# Patient Record
Sex: Female | Born: 1935
Health system: Southern US, Community
[De-identification: ages and names within clinical notes are randomized; demographics above are authoritative.]

## PROBLEM LIST (undated history)

## (undated) DIAGNOSIS — L570 Actinic keratosis: Secondary | ICD-10-CM

## (undated) DIAGNOSIS — E039 Hypothyroidism, unspecified: Secondary | ICD-10-CM

## (undated) DIAGNOSIS — G4700 Insomnia, unspecified: Secondary | ICD-10-CM

## (undated) DIAGNOSIS — E785 Hyperlipidemia, unspecified: Secondary | ICD-10-CM

## (undated) DIAGNOSIS — H3530 Unspecified macular degeneration: Secondary | ICD-10-CM

## (undated) DIAGNOSIS — I10 Essential (primary) hypertension: Secondary | ICD-10-CM

## (undated) HISTORY — DX: Essential (primary) hypertension: I10

## (undated) HISTORY — DX: Unspecified macular degeneration: H35.30

## (undated) HISTORY — DX: Hyperlipidemia, unspecified: E78.5

## (undated) HISTORY — DX: Hypothyroidism, unspecified: E03.9

## (undated) HISTORY — DX: Actinic keratosis: L57.0

## (undated) HISTORY — DX: Insomnia, unspecified: G47.00

---

## 2003-01-19 ENCOUNTER — Ambulatory Visit (HOSPITAL_COMMUNITY): Admission: RE | Admit: 2003-01-19 | Discharge: 2003-01-19 | Payer: Self-pay | Admitting: Surgery

## 2004-11-23 ENCOUNTER — Ambulatory Visit: Payer: Self-pay | Admitting: Family Medicine

## 2005-11-23 ENCOUNTER — Encounter (INDEPENDENT_AMBULATORY_CARE_PROVIDER_SITE_OTHER): Payer: Self-pay | Admitting: *Deleted

## 2005-11-23 LAB — CONVERTED CEMR LAB: Pap Smear: NORMAL

## 2006-02-19 ENCOUNTER — Ambulatory Visit: Payer: Self-pay | Admitting: Family Medicine

## 2006-05-28 ENCOUNTER — Ambulatory Visit: Payer: Self-pay | Admitting: Family Medicine

## 2007-01-22 ENCOUNTER — Encounter: Payer: Self-pay | Admitting: Family Medicine

## 2007-01-22 ENCOUNTER — Ambulatory Visit: Payer: Self-pay | Admitting: Family Medicine

## 2007-01-22 LAB — CONVERTED CEMR LAB
BUN: 15 mg/dL (ref 6–23)
CO2: 26 meq/L (ref 19–32)
Chloride: 102 meq/L (ref 96–112)
Creatinine, Ser: 0.94 mg/dL (ref 0.40–1.20)
Potassium: 4.9 meq/L (ref 3.5–5.3)
Sodium: 141 meq/L (ref 135–145)

## 2007-02-20 DIAGNOSIS — E039 Hypothyroidism, unspecified: Secondary | ICD-10-CM | POA: Insufficient documentation

## 2007-02-20 DIAGNOSIS — G4700 Insomnia, unspecified: Secondary | ICD-10-CM

## 2007-02-21 ENCOUNTER — Encounter (INDEPENDENT_AMBULATORY_CARE_PROVIDER_SITE_OTHER): Payer: Self-pay | Admitting: *Deleted

## 2007-03-20 ENCOUNTER — Ambulatory Visit: Payer: Self-pay | Admitting: Sports Medicine

## 2007-03-20 ENCOUNTER — Encounter: Payer: Self-pay | Admitting: Family Medicine

## 2007-03-21 ENCOUNTER — Encounter (INDEPENDENT_AMBULATORY_CARE_PROVIDER_SITE_OTHER): Payer: Self-pay | Admitting: *Deleted

## 2007-11-12 ENCOUNTER — Encounter (INDEPENDENT_AMBULATORY_CARE_PROVIDER_SITE_OTHER): Payer: Self-pay | Admitting: Family Medicine

## 2007-11-12 ENCOUNTER — Ambulatory Visit: Payer: Self-pay | Admitting: Family Medicine

## 2007-11-12 LAB — CONVERTED CEMR LAB: TSH: 3.498 microintl units/mL (ref 0.350–5.50)

## 2007-12-11 ENCOUNTER — Encounter: Admission: RE | Admit: 2007-12-11 | Discharge: 2007-12-11 | Payer: Self-pay | Admitting: Sports Medicine

## 2007-12-15 ENCOUNTER — Encounter (INDEPENDENT_AMBULATORY_CARE_PROVIDER_SITE_OTHER): Payer: Self-pay | Admitting: Family Medicine

## 2008-09-17 ENCOUNTER — Ambulatory Visit: Payer: Self-pay | Admitting: Family Medicine

## 2009-01-04 ENCOUNTER — Ambulatory Visit: Payer: Self-pay | Admitting: Family Medicine

## 2009-01-04 ENCOUNTER — Encounter (INDEPENDENT_AMBULATORY_CARE_PROVIDER_SITE_OTHER): Payer: Self-pay | Admitting: Family Medicine

## 2009-01-04 DIAGNOSIS — I10 Essential (primary) hypertension: Secondary | ICD-10-CM | POA: Insufficient documentation

## 2009-01-04 LAB — CONVERTED CEMR LAB
AST: 11 units/L (ref 0–37)
Albumin: 4.1 g/dL (ref 3.5–5.2)
CO2: 25 meq/L (ref 19–32)
Creatinine, Ser: 0.77 mg/dL (ref 0.40–1.20)
HDL: 33 mg/dL — ABNORMAL LOW (ref >39)
Potassium: 4.9 meq/L (ref 3.5–5.3)
TSH: 2.262 microintl units/mL (ref 0.350–4.50)
Total Bilirubin: 0.5 mg/dL (ref 0.3–1.2)
Total CHOL/HDL Ratio: 6.5
Total Protein: 6.9 g/dL (ref 6.0–8.3)
Triglycerides: 263 mg/dL — ABNORMAL HIGH (ref <150)
VLDL: 53 mg/dL — ABNORMAL HIGH (ref 0–40)

## 2009-01-05 ENCOUNTER — Encounter (INDEPENDENT_AMBULATORY_CARE_PROVIDER_SITE_OTHER): Payer: Self-pay | Admitting: Family Medicine

## 2009-01-06 ENCOUNTER — Encounter: Admission: RE | Admit: 2009-01-06 | Discharge: 2009-01-06 | Payer: Self-pay | Admitting: Family Medicine

## 2009-10-14 ENCOUNTER — Ambulatory Visit: Payer: Self-pay | Admitting: Family Medicine

## 2010-02-09 ENCOUNTER — Encounter: Admission: RE | Admit: 2010-02-09 | Discharge: 2010-02-09 | Payer: Self-pay | Admitting: Family Medicine

## 2010-03-08 ENCOUNTER — Ambulatory Visit: Payer: Self-pay | Admitting: Family Medicine

## 2010-03-08 DIAGNOSIS — E785 Hyperlipidemia, unspecified: Secondary | ICD-10-CM | POA: Insufficient documentation

## 2010-03-08 DIAGNOSIS — E782 Mixed hyperlipidemia: Secondary | ICD-10-CM | POA: Insufficient documentation

## 2010-03-13 ENCOUNTER — Encounter: Payer: Self-pay | Admitting: Family Medicine

## 2010-03-13 ENCOUNTER — Ambulatory Visit: Payer: Self-pay | Admitting: Family Medicine

## 2010-03-18 ENCOUNTER — Encounter: Payer: Self-pay | Admitting: Family Medicine

## 2010-03-18 LAB — CONVERTED CEMR LAB
ALT: 14 units/L (ref 0–35)
AST: 14 units/L (ref 0–37)
Albumin: 4.2 g/dL (ref 3.5–5.2)
Alkaline Phosphatase: 83 units/L (ref 39–117)
BUN: 19 mg/dL (ref 6–23)
CO2: 25 meq/L (ref 19–32)
Calcium: 9.6 mg/dL (ref 8.4–10.5)
Chloride: 104 meq/L (ref 96–112)
Cholesterol: 232 mg/dL — ABNORMAL HIGH (ref 0–200)
Creatinine, Ser: 0.86 mg/dL (ref 0.40–1.20)
Glucose, Bld: 91 mg/dL (ref 70–99)
HDL: 33 mg/dL — ABNORMAL LOW (ref >39)
LDL Cholesterol: 148 mg/dL — ABNORMAL HIGH (ref 0–99)
Potassium: 4.7 meq/L (ref 3.5–5.3)
Sodium: 141 meq/L (ref 135–145)
TSH: 4.537 microintl units/mL — ABNORMAL HIGH (ref 0.350–4.500)
Total Bilirubin: 0.4 mg/dL (ref 0.3–1.2)
Total CHOL/HDL Ratio: 7
Total Protein: 6.7 g/dL (ref 6.0–8.3)
Triglycerides: 256 mg/dL — ABNORMAL HIGH (ref <150)
VLDL: 51 mg/dL — ABNORMAL HIGH (ref 0–40)
Vit D, 25-Hydroxy: 28 ng/mL — ABNORMAL LOW (ref 30–89)

## 2010-08-24 ENCOUNTER — Ambulatory Visit: Payer: Self-pay | Admitting: Family Medicine

## 2010-08-24 ENCOUNTER — Encounter: Payer: Self-pay | Admitting: Family Medicine

## 2010-08-24 DIAGNOSIS — E559 Vitamin D deficiency, unspecified: Secondary | ICD-10-CM

## 2010-08-24 LAB — CONVERTED CEMR LAB: TSH: 0.02 microintl units/mL — ABNORMAL LOW (ref 0.350–4.500)

## 2010-08-25 ENCOUNTER — Telehealth: Payer: Self-pay | Admitting: Family Medicine

## 2010-10-25 ENCOUNTER — Ambulatory Visit: Payer: Self-pay | Admitting: Family Medicine

## 2010-11-15 ENCOUNTER — Ambulatory Visit: Payer: Self-pay | Admitting: Family Medicine

## 2010-11-15 ENCOUNTER — Encounter: Payer: Self-pay | Admitting: Family Medicine

## 2010-11-15 LAB — CONVERTED CEMR LAB
Direct LDL: 99 mg/dL
TSH: 2.765 microintl units/mL (ref 0.350–4.500)

## 2010-11-17 ENCOUNTER — Encounter: Payer: Self-pay | Admitting: Family Medicine

## 2011-01-23 NOTE — Progress Notes (Signed)
 ----   Converted from flag ---- ---- 08/25/2010 8:02 AM, Helane Rima DO wrote: please call mrs. Kirkendall to let her know that the 88 micrograms thyroid med is too high and to go back to her 1/2 tab of 125. recheck in 6-8 weeks. ------------------------------  Informed Ms Dobbins who understands.  said she did not need this called to pharmacy, she has enough.  Starleen Blue RN

## 2011-01-23 NOTE — Assessment & Plan Note (Signed)
Summary: f/u,df   Vital Signs:  Patient profile:   75 year old female Weight:      159.5 pounds Temp:     97.9 degrees F Pulse rate:   100 / minute BP sitting:   141 / 84  Vitals Entered By: Starleen Blue RN (August 24, 2010 1:39 PM) CC: f/u hypothyroid, HLD, labs Is Patient Diabetic? No Pain Assessment Patient in pain? no        Primary Care Provider:  Helane Rima DO  CC:  f/u hypothyroid, HLD, and labs.  History of Present Illness: 75 yo F:  1. Hypothryroid: Last TSH slightly high, so Levothyroid increased from 67.5 to 88 micrograms/d. She denies heat or cold intolerance, diarrhea or constipation.   2. HLD: On recent FLP. Started Simvastatin. Denies myalgias.  3. Vitamin D Deficiency: On recent labs. Now taking calcium plus vitamin D plus and extra 2000 International Units of vitamin D daily. Last DEXA > 3 years ago. "Normal" per patient.  4. Prevention: Reviewed recent labs together.  Current Medications (verified): 1)  Levothroid 88 Mcg Tabs (Levothyroxine Sodium) .... One By Mouth Daily 2)  Tylenol Pm Extra Strength 500-25 Mg  Tabs (Diphenhydramine-Apap (Sleep)) .Marland Kitchen.. 1 Tablet By Mouth At Bedtime As Needed Insomnia 3)  Ocuvite Preservision  Tabs (Multiple Vitamins-Minerals) .... 2 Tablets By Mouth Daily 4)  Caltrate 600+d 600-400 Mg-Unit Tabs (Calcium Carbonate-Vitamin D) .... 2 By Mouth Daily 5)  Simvastatin 20 Mg Tabs (Simvastatin) .Marland Kitchen.. 1 By Mouth At Bedtime  Allergies (verified): No Known Drug Allergies  Past History:  Social History: Last updated: 03/08/2010 No tob, etoh, drugs; one child; worked in Public librarian.  Married to [employer].  Has 2 grandchildren and a great grandchild.  Eats lots of salty and fried foods.  Past Medical History: Early Macular Degeneration    -- Dr. Minerva Areola (optometrist).  Taking ocuvite. Borderline HTN HLD Intermittent Insomnia Hypothyroidism Vitamin D Deficiency     PMH-FH-SH reviewed for relevance  Review  of Systems General:  Denies chills and fever. CV:  Denies chest pain or discomfort, palpitations, shortness of breath with exertion, and swelling of feet. Resp:  Denies cough and wheezing. GI:  Denies change in bowel habits. Neuro:  Denies numbness and tingling. Psych:  Denies anxiety and depression. Endo:  Denies cold intolerance, heat intolerance, and weight change.  Physical Exam  General:  Alert, well-developed, well-nourished, and well-hydrated.  Vitals reviewed. Lungs:  Normal respiratory effort, chest expands symmetrically. Lungs are clear to auscultation, no crackles or wheezes. Heart:  Tachycardic. S1 and S2 normal without gallop, murmur, click, rub or other extra sounds. Pulses:  R and L dorsalis pedis and posterior tibial pulses are full and equal bilaterally. Extremities:  No edema. Psych:  Oriented X3, memory intact for recent and remote, normally interactive, good eye contact, and not anxious appearing.     Impression & Recommendations:  Problem # 1:  HYPOTHYROIDISM, UNSPECIFIED (ICD-244.9) Assessment Unchanged Check TSH. Suspect dose too high with patient's tachycardia. Her updated medication list for this problem includes:    Levothroid 88 Mcg Tabs (Levothyroxine sodium) ..... One by mouth daily  Orders: TSH-[facility] (43329-51884) [facility]- Est  Level 4 (16606)  Problem # 2:  HYPERLIPIDEMIA (ICD-272.4) Assessment: Unchanged  Continue Simvastatin. Recheck FLP at next visit - Advised patient to come in am and fasting. Her updated medication list for this problem includes:    Simvastatin 20 Mg Tabs (Simvastatin) .Marland Kitchen... 1 by mouth at bedtime  Orders: [facility]- Est  Level 4 (99214)  Problem # 3:  VITAMIN D DEFICIENCY (ICD-268.9) Assessment: New  Continue vitamin D (2000 International Units) every other day since she is also taking the vitamin D plus calcium supplement.  Orders: [facility]- Est  Level 4 (99214)  Problem # 4:  HYPERTENSION, BORDERLINE (ICD-401.9) Assessment:  Unchanged Patient continues to have borderline HTN. Advised her to start checking her BP at home. We will review this at her next visit. She is willing to start a medication if we decide that it is needed. Orders: [facility]- Est  Level 4 (99214)  Complete Medication List: 1)  Levothroid 88 Mcg Tabs (Levothyroxine sodium) .... One by mouth daily 2)  Tylenol Pm Extra Strength 500-25 Mg Tabs (Diphenhydramine-apap (sleep)) .Marland Kitchen.. 1 tablet by mouth at bedtime as needed insomnia 3)  Ocuvite Preservision Tabs (Multiple vitamins-minerals) .... 2 tablets by mouth daily 4)  Caltrate 600+d 600-400 Mg-unit Tabs (Calcium carbonate-vitamin d) .... 2 by mouth daily 5)  Simvastatin 20 Mg Tabs (Simvastatin) .Marland Kitchen.. 1 by mouth at bedtime  Patient Instructions: 1)  Follow up in 3 months. 2)  We will call with the results of your lab tests.  Prevention & Chronic Care Immunizations   Influenza vaccine: Fluvax MCR  (10/14/2009)   Influenza vaccine deferral: Deferred  (08/24/2010)   Influenza vaccine due: 09/17/2009    Tetanus booster: Not documented   Td booster deferral: Not indicated  (03/08/2010)    Pneumococcal vaccine: given  (11/23/2004)   Pneumococcal vaccine due: None    H. zoster vaccine: Not documented   H. zoster vaccine deferral: Not indicated  (03/08/2010)  Colorectal Screening   Hemoccult: Not documented   Hemoccult due: Not Indicated    Colonoscopy: normal  (12/24/2002)   Colonoscopy due: 12/2012  Other Screening   Pap smear: normal  (11/23/2005)   Pap smear due: Not Indicated    Mammogram: ASSESSMENT: Negative - BI-RADS 1^MM DIGITAL SCREENING  (02/09/2010)   Mammogram action/deferral: Screening mammogram in 1 year.     (12/15/2007)   Mammogram due: 12/14/2008    DXA bone density scan: Not documented   DXA bone density action/deferral: Deferred  (08/24/2010)   Smoking status: never  (03/08/2010)  Lipids   Total Cholesterol: 232  (03/13/2010)   LDL: 148  (03/13/2010)   LDL  Direct: Not documented   HDL: 33  (03/13/2010)   Triglycerides: 256  (03/13/2010)    SGOT (AST): 14  (03/13/2010)   SGPT (ALT): 14  (03/13/2010)   Alkaline phosphatase: 83  (03/13/2010)   Total bilirubin: 0.4  (03/13/2010)    Lipid flowsheet reviewed?: Yes   Progress toward LDL goal: Unchanged  Hypertension   Last Blood Pressure: 141 / 84  (08/24/2010)   Serum creatinine: 0.86  (03/13/2010)   Serum potassium 4.7  (03/13/2010)    Hypertension flowsheet reviewed?: Yes   Progress toward BP goal: At goal  Self-Management Support :   Personal Goals (by the next clinic visit) :      Personal blood pressure goal: 140/90  (03/08/2010)     Personal LDL goal: 130  (03/08/2010)    Patient will work on the following items until the next clinic visit to reach self-care goals:     Medications and monitoring: take my medicines every day, bring all of my medications to every visit  (08/24/2010)     Eating: drink diet soda or water instead of juice or soda, eat more vegetables, use fresh or frozen vegetables, eat foods that are low in salt,  eat baked foods instead of fried foods, eat fruit for snacks and desserts, limit or avoid alcohol  (08/24/2010)     Activity: take a 30 minute walk every day, take the stairs instead of the elevator, park at the far end of the parking lot  (08/24/2010)    Hypertension self-management support: Written self-care plan  (08/24/2010)   Hypertension self-care plan printed.    Lipid self-management support: Written self-care plan  (08/24/2010)   Lipid self-care plan printed.

## 2011-01-23 NOTE — Assessment & Plan Note (Signed)
Summary: flu shot,df   Nurse Visit   Allergies: No Known Drug Allergies  Immunizations Administered:  Influenza Vaccine # 1:    Vaccine Type: Fluvax MCR    Site: left deltoid    Mfr: Gl[employer]oSmithKline    Dose: 0.5 ml    Route: IM    Given by: Theresia Lo RN    Exp. Date: 06/20/2011    Lot #: [id number]    VIS given: 07/18/10 version given October 25, 2010.  Flu Vaccine Consent Questions:    Do you have a history of severe allergic reactions to this vaccine? no    Any prior history of allergic reactions to egg and/or gelatin? no    Do you have a sensitivity to the preservative Thimersol? no    Do you have a past history of Guillan-Barre Syndrome? no    Do you currently have an acute febrile illness? no    Have you ever had a severe reaction to latex? no    Vaccine information given and explained to patient? yes    Are you currently pregnant? no  Orders Added: 1)  Influenza Vaccine MCR [00025] 2)  Administration Flu vaccine - MCR [G0008]  Appended Document: flu shot,df   Vital Signs:  Patient profile:   75 year old female Temp:     98.6 degrees F  Vitals Entered By: Theresia Lo RN (October 25, 2010 2:29 PM)

## 2011-01-23 NOTE — Assessment & Plan Note (Signed)
Summary: Kristina Carlson   Vital Signs:  Patient profile:   75 year old female Weight:      159.6 pounds Pulse rate:   67 / minute BP sitting:   144 / 83  (right arm)  Vitals Entered By: Arlyss Repress CMA, (November 15, 2010 8:58 AM)  Serial Vital Signs/Assessments:  Time      Position  BP       Pulse  Resp  Temp     By                     137/76                         Helane Rima DO  CC: re-check thyroid, LDL Is Patient Diabetic? No Pain Assessment Patient in pain? no        Primary Care Provider:  Helane Rima DO  CC:  re-check thyroid and LDL.  History of Present Illness: 75 yo F:  1. Hypothryroid: Rx Levothyroid 67.5. She denies heat or cold intolerance, diarrhea or constipation.   2. HLD: Rx Simvastatin. Denies myalgias.  3. Vitamin D Deficiency: On recent labs. Now taking calcium plus vitamin D plus and extra 2000 International Units of vitamin D daily. Last DEXA > 3 years ago. "Normal" per patient.    Habits & Providers  Alcohol-Tobacco-Diet     Tobacco Status: never  Current Medications (verified): 1)  Levothroid 88 Mcg Tabs (Levothyroxine Sodium) .... One By Mouth Daily 2)  Tylenol Pm Extra Strength 500-25 Mg  Tabs (Diphenhydramine-Apap (Sleep)) .Marland Kitchen.. 1 Tablet By Mouth At Bedtime As Needed Insomnia 3)  Ocuvite Preservision  Tabs (Multiple Vitamins-Minerals) .... 2 Tablets By Mouth Daily 4)  Caltrate 600+d 600-400 Mg-Unit Tabs (Calcium Carbonate-Vitamin D) .... 2 By Mouth Daily 5)  Simvastatin 20 Mg Tabs (Simvastatin) .Marland Kitchen.. 1 By Mouth At Bedtime  Allergies (verified): No Known Drug Allergies PMH-FH-SH reviewed for relevance  Review of Systems General:  Denies chills and fever. CV:  Denies chest pain or discomfort, palpitations, shortness of breath with exertion, and swelling of feet. Resp:  Denies cough and shortness of breath. GI:  Denies constipation, diarrhea, nausea, and vomiting. MS:  Denies joint pain. Derm:  Denies rash. Endo:  Denies cold  intolerance and heat intolerance.  Physical Exam  General:  Alert, well-developed, well-nourished, and well-hydrated.  Vitals reviewed. Lungs:  Normal respiratory effort, chest expands symmetrically. Lungs are clear to auscultation, no crackles or wheezes. Heart:  S1 and S2 normal without gallop, murmur, click, rub or other extra sounds. Abdomen:  Bowel sounds positive,abdomen soft and non-tender without masses, organomegaly or hernias noted. Pulses:  R and L dorsalis pedis and posterior tibial pulses are full and equal bilaterally. Extremities:  No edema.   Impression & Recommendations:  Problem # 1:  HYPOTHYROIDISM, UNSPECIFIED (ICD-244.9) Assessment Unchanged Recheck TSH. Her updated medication list for this problem includes:    Levothroid 88 Mcg Tabs (Levothyroxine sodium) ..... One by mouth daily  Orders: [facility] ([id number]) [facility]- Est  Level 4 (32951)  Problem # 2:  HYPERLIPIDEMIA (ICD-272.4) Assessment: Unchanged Recheck LDL. Her updated medication list for this problem includes:    Simvastatin 20 Mg Tabs (Simvastatin) .Marland Kitchen... 1 by mouth at bedtime  Orders: Direct LDL-[facility] ([id number]) [facility]- Est  Level 4 (16010)  Problem # 3:  VITAMIN D DEFICIENCY (ICD-268.9) Assessment: Unchanged  Continue vitamin D (2000 International Units) every other day since she is also taking the vitamin D  plus calcium supplement.  Orders: [facility]- Est  Level 4 (99214)  Complete Medication List: 1)  Levothroid 88 Mcg Tabs (Levothyroxine sodium) .... One by mouth daily 2)  Tylenol Pm Extra Strength 500-25 Mg Tabs (Diphenhydramine-apap (sleep)) .Marland Kitchen.. 1 tablet by mouth at bedtime as needed insomnia 3)  Ocuvite Preservision Tabs (Multiple vitamins-minerals) .... 2 tablets by mouth daily 4)  Caltrate 600+d 600-400 Mg-unit Tabs (Calcium carbonate-vitamin d) .... 2 by mouth daily 5)  Simvastatin 20 Mg Tabs (Simvastatin) .Marland Kitchen.. 1 by mouth at bedtime  Patient Instructions: 1)  It was nice to see you  today!   Orders Added: 1)  Direct LDL-[facility] [[id number]] 2)  TSH-[facility] [[id number]] 3)  [facility]- Est  Level 4 [60109]    Prevention & Chronic Care Immunizations   Influenza vaccine: Fluvax MCR  (10/25/2010)   Influenza vaccine deferral: Deferred  (08/24/2010)   Influenza vaccine due: 09/17/2009    Tetanus booster: Not documented   Td booster deferral: Not indicated  (03/08/2010)    Pneumococcal vaccine: given  (11/23/2004)   Pneumococcal vaccine due: None    H. zoster vaccine: Not documented   H. zoster vaccine deferral: Not indicated  (03/08/2010)  Colorectal Screening   Hemoccult: Not documented   Hemoccult due: Not Indicated    Colonoscopy: normal  (12/24/2002)   Colonoscopy due: 12/2012  Other Screening   Pap smear: normal  (11/23/2005)   Pap smear due: Not Indicated    Mammogram: ASSESSMENT: Negative - BI-RADS 1^MM DIGITAL SCREENING  (02/09/2010)   Mammogram action/deferral: Screening mammogram in 1 year.     (12/15/2007)   Mammogram due: 12/14/2008    DXA bone density scan: Not documented   DXA bone density action/deferral: Not indicated  (11/15/2010)   Smoking status: never  (11/15/2010)  Lipids   Total Cholesterol: 232  (03/13/2010)   LDL: 148  (03/13/2010)   LDL Direct: Not documented   HDL: 33  (03/13/2010)   Triglycerides: 256  (03/13/2010)    SGOT (AST): 14  (03/13/2010)   SGPT (ALT): 14  (03/13/2010)   Alkaline phosphatase: 83  (03/13/2010)   Total bilirubin: 0.4  (03/13/2010)    Lipid flowsheet reviewed?: Yes   Progress toward LDL goal: Unchanged  Hypertension   Last Blood Pressure: 144 / 83  (11/15/2010)   Serum creatinine: 0.86  (03/13/2010)   Serum potassium 4.7  (03/13/2010)    Hypertension flowsheet reviewed?: Yes   Progress toward BP goal: At goal  Self-Management Support :   Personal Goals (by the next clinic visit) :      Personal blood pressure goal: 140/90  (03/08/2010)     Personal LDL goal: 130  (03/08/2010)    Patient  will work on the following items until the next clinic visit to reach self-care goals:     Medications and monitoring: take my medicines every day, bring all of my medications to every visit  (11/15/2010)     Eating: drink diet soda or water instead of juice or soda, eat more vegetables, use fresh or frozen vegetables, eat foods that are low in salt, eat baked foods instead of fried foods, eat fruit for snacks and desserts, limit or avoid alcohol  (11/15/2010)     Activity: take a 30 minute walk every day, take the stairs instead of the elevator, park at the far end of the parking lot  (11/15/2010)    Hypertension self-management support: Written self-care plan  (11/15/2010)   Hypertension self-care plan printed.    Lipid self-management support:  Written self-care plan  (11/15/2010)   Lipid self-care plan printed.

## 2011-01-23 NOTE — Letter (Signed)
Summary: Results Letter  [facility]  [address]   [place], [place] [address]   Phone: [phone]  Fax: [phone]    03/18/2010  Kristina Carlson [address] [place], [place]  [address]  Dear Kristina Carlson,  I reviewed your recent labs and would like to make a few changes to your medication regimen. I am also including your labs for you to view.  1. I would like to add a medication for your cholesterol as it is too high. The medication is called Simvastatin. Take the medication each day and follow the diet recommendations that I am providing. We will recheck your cholesterol in 3 months.  2. Your thyroid medication needs to be stronger. I have sent in a new prescription for you. We will need to recheck your thyroid level (this can be done when we check your cholesterol).  3. Your vitamin D is slightly low. I would like for you to take a calcium plus vitamin D supplement daily. Try to aim for 1200 mg of calcium and 1000 International Units of vitamin D daily.  Please don't hesitate to call if you have any questions or concerns.   Sincerely,   Helane Rima DO  Appended Document: Results Letter We have carefully reviewed your last lipid profile from 03/13/2010 and the results are noted below with a summary of recommendations for lipid management.    Cholesterol:       232       HDL "good" Cholesterol:   33       LDL "bad" Cholesterol:   148       Triglycerides:       256        TLC Diet (Therapeutic Lifestyle Change): Saturated Fats & Transfatty acids should be kept < 7% of total calories ***Reduce Saturated Fats Polyunstaurated Fat can be up to 10% of total calories Monounsaturated Fat Fat can be up to 20% of total calories Total Fat should be no greater than 25-35% of total calories Carbohydrates should be 50-60% of total calories Protein should be approximately 15% of total calories Fiber should be at least 20-30 grams a day ***Increased fiber may help lower  LDL Total Cholesterol should be < 200mg /day Consider adding plant stanol/sterols to diet (example: Benacol spread) ***A higher intake of unsaturated fat may reduce Triglycerides and Increase HDL    Adjunctive Measures (may lower LIPIDS and reduce risk of Heart Attack) include: Aerobic Exercise (20-30 minutes 3-4 times a week) Limit Alcohol Consumption Weight Reduction Aspirin 75-81 mg a day by mouth (if not allergic or contraindicated) Dietary Fiber 20-30 grams a day by mouth     Current Medications: 1)    Levothroid 88 Mcg Tabs (Levothyroxine sodium) .... One by mouth daily 2)    Tylenol Pm Extra Strength 500-25 Mg  Tabs (Diphenhydramine-apap (sleep)) .Marland Kitchen.. 1 tablet by mouth at bedtime as needed insomnia 3)    Ocuvite Preservision  Tabs (Multiple vitamins-minerals) .... 2 tablets by mouth daily 4)    Caltrate 600+d 600-400 Mg-unit Tabs (Calcium carbonate-vitamin d) .... 2 by mouth daily 5)    Simvastatin 20 Mg Tabs (Simvastatin) .Marland Kitchen.. 1 by mouth at bedtime  Appended Document: Results Letter mailed.

## 2011-01-23 NOTE — Letter (Signed)
Summary: Generic Letter  [facility]  [address]   [place], [place] [address]   Phone: [phone]  Fax: [phone]    11/17/2010  DEISSY GUILBERT [address] [place], [place]  [address]  Dear Ms. Vasseur,  I am happy to inform you that your recent labs were normal.  Sincerely,   Helane Rima DO  Appended Document: Generic Letter mailed

## 2011-01-23 NOTE — Assessment & Plan Note (Signed)
Summary: f/up,tcb   Vital Signs:  Patient profile:   75 year old female Weight:      164 pounds Temp:     97.8 degrees F oral Pulse rate:   78 / minute BP sitting:   140 / 87  (right arm) Cuff size:   regular  Vitals Entered By: Tessie Fass CMA (March 08, 2010 3:45 PM) CC: F/U hypothyoid, lipid, BP Is Patient Diabetic? No Pain Assessment Patient in pain? no        Primary Care Provider:  Helane Rima DO  CC:  F/U hypothyoid, lipid, and BP.  History of Present Illness: 75 year old female:  1.  Hypothyroid: Rx Levoxyl. Needs TSH and med refills.  Denies fatigue, depression, & abnormal bowel habits.  2. Preventive Health: Due for labwork.    Habits & Providers  Alcohol-Tobacco-Diet     Tobacco Status: never  Current Medications (verified): 1)  Levothroid 125 Mcg  Tabs (Levothyroxine Sodium) .... 1/2 Tablet By Mouth Once Daily 2)  Tylenol Pm Extra Strength 500-25 Mg  Tabs (Diphenhydramine-Apap (Sleep)) .Marland Kitchen.. 1 Tablet By Mouth At Bedtime As Needed Insomnia 3)  Ocuvite Preservision  Tabs (Multiple Vitamins-Minerals) .... 2 Tablets By Mouth Daily  Allergies (verified): No Known Drug Allergies  Past History:  Past Medical History: Early Macular Degeneration    -- Dr. Minerva Areola (optometrist).  Taking ocuvite. Borderline HTN HLD Intermittent Insomnia Hypothyroidism      Family History: Brother died of lung cancer Father died of stroke at age of 18 Mother died of accident at 61 Brother had colon cancer  Social History: No tob, etoh, drugs; one child; worked in Public librarian.  Married to [employer].  Has 2 grandchildren and a great grandchild.  Eats lots of salty and fried foods.Smoking Status:  never  Review of Systems General:  Denies chills and fever. CV:  Denies chest pain or discomfort, shortness of breath with exertion, and swelling of feet. Resp:  Denies cough. GI:  Denies change in bowel habits. Neuro:  Denies numbness and tingling. Psych:   Denies anxiety and depression. Endo:  Denies cold intolerance, heat intolerance, and weight change.  Physical Exam  General:  Alert, well-developed, well-nourished, and well-hydrated.  Vitals reviewed. Neck:  No deformities, masses, or tenderness noted. Lungs:  Normal respiratory effort, chest expands symmetrically. Lungs are clear to auscultation, no crackles or wheezes. Heart:  Normal rate and regular rhythm. S1 and S2 normal without gallop, murmur, click, rub or other extra sounds. Abdomen:  Bowel sounds positive,abdomen soft and non-tender without masses, organomegaly or hernias noted. Pulses:  R and L,dorsalis pedis and posterior tibial pulses are full and equal bilaterally. Extremities:  No edema. Psych:  Oriented X3, memory intact for recent and remote, normally interactive, good eye contact, and not anxious appearing.     Impression & Recommendations:  Problem # 1:  HYPOTHYROIDISM, UNSPECIFIED (ICD-244.9) Assessment Unchanged Refilled medication. Will check TSH. Her updated medication list for this problem includes:    Levothroid 125 Mcg Tabs (Levothyroxine sodium) .Marland Kitchen... 1/2 tablet by mouth once daily  Orders: [facility]- Est  Level 4 (99214)Future Orders: Vit D, 25 [facility] ([id number]) ... 03/13/2010 TSH-[facility] [id number]) ... 03/13/2010 Comp Met-[facility] ([id number]) ... 03/13/2010  Problem # 2:  HYPERTENSION, BORDERLINE (ICD-401.9) Assessment: Unchanged Will check labs. No medication added today. Discussed lower salt diet. Orders: [facility]- Est  Level 4 (99214)Future Orders: Comp Met-[facility] ([id number]) ... 03/13/2010  Problem # 3:  HYPERLIPIDEMIA (ICD-272.4) Assessment: Unchanged  Orders: [facility]- Est  Level 4 (99214)Future Orders: Lipid-[facility] ([id number]) ... 03/13/2010 Comp Met-[facility] ([id number]) ... 03/13/2010  Complete Medication List: 1)  Levothroid 125 Mcg Tabs (Levothyroxine sodium) .... 1/2 tablet by mouth once daily 2)  Tylenol Pm Extra Strength 500-25 Mg Tabs  (Diphenhydramine-apap (sleep)) .Marland Kitchen.. 1 tablet by mouth at bedtime as needed insomnia 3)  Ocuvite Preservision Tabs (Multiple vitamins-minerals) .... 2 tablets by mouth daily  Patient Instructions: 1)  It was nice to see you today! 2)  Come back next week (fasting) for lab work. Prescriptions: LEVOTHROID 125 MCG  TABS (LEVOTHYROXINE SODIUM) 1/2 tablet by mouth once daily  #90 x 4   Entered and Authorized by:   Helane Rima DO   Signed by:   Helane Rima DO on 03/08/2010   Method used:   Print then Give to Patient   RxID:   [id number]   Prevention & Chronic Care Immunizations   Influenza vaccine: Fluvax MCR  (10/14/2009)   Influenza vaccine due: 09/17/2009    Tetanus booster: Not documented   Td booster deferral: Not indicated  (03/08/2010)    Pneumococcal vaccine: given  (11/23/2004)   Pneumococcal vaccine due: None    H. zoster vaccine: Not documented   H. zoster vaccine deferral: Not indicated  (03/08/2010)  Colorectal Screening   Hemoccult: Not documented   Hemoccult due: Not Indicated    Colonoscopy: normal  (12/24/2002)   Colonoscopy due: 12/2012  Other Screening   Pap smear: normal  (11/23/2005)   Pap smear due: Not Indicated    Mammogram: ASSESSMENT: Negative - BI-RADS 1^MM DIGITAL SCREENING  (02/09/2010)   Mammogram action/deferral: Screening mammogram in 1 year.     (12/15/2007)   Mammogram due: 12/14/2008    DXA bone density scan: Not documented   Smoking status: never  (03/08/2010)  Lipids   Total Cholesterol: 213  (01/04/2009)   LDL: 127  (01/04/2009)   LDL Direct: Not documented   HDL: 33  (01/04/2009)   Triglycerides: 263  (01/04/2009)    SGOT (AST): 11  (01/04/2009)   SGPT (ALT): 14  (01/04/2009) CMP ordered    Alkaline phosphatase: 79  (01/04/2009)   Total bilirubin: 0.5  (01/04/2009)    Lipid flowsheet reviewed?: Yes   Progress toward LDL goal: Unchanged  Hypertension   Last Blood Pressure: 140 / 87  (03/08/2010)   Serum  creatinine: 0.77  (01/04/2009)   Serum potassium 4.9  (01/04/2009) CMP ordered     Hypertension flowsheet reviewed?: Yes   Progress toward BP goal: Unchanged  Self-Management Support :   Personal Goals (by the next clinic visit) :      Personal blood pressure goal: 140/90  (03/08/2010)     Personal LDL goal: 130  (03/08/2010)    Patient will work on the following items until the next clinic visit to reach self-care goals:     Medications and monitoring: take my medicines every day, bring all of my medications to every visit  (03/08/2010)     Eating: drink diet soda or water instead of juice or soda, eat more vegetables, use fresh or frozen vegetables, eat foods that are low in salt, eat baked foods instead of fried foods, eat fruit for snacks and desserts  (03/08/2010)     Activity: take a 30 minute walk every day, take the stairs instead of the elevator, park at the far end of the parking lot  (03/08/2010)    Hypertension self-management support: Written self-care plan  (03/08/2010)   Hypertension self-care plan printed.  Lipid self-management support: Written self-care plan  (03/08/2010)   Lipid self-care plan printed.

## 2011-01-27 ENCOUNTER — Encounter: Payer: Self-pay | Admitting: *Deleted

## 2011-04-26 ENCOUNTER — Other Ambulatory Visit: Payer: Self-pay | Admitting: Family Medicine

## 2011-04-26 DIAGNOSIS — Z1231 Encounter for screening mammogram for malignant neoplasm of breast: Secondary | ICD-10-CM

## 2011-05-03 ENCOUNTER — Ambulatory Visit
Admission: RE | Admit: 2011-05-03 | Discharge: 2011-05-03 | Disposition: A | Discharge status: Home / Self Care | Payer: Medicare Other | Source: Ambulatory Visit | Attending: Family Medicine | Admitting: Family Medicine

## 2011-05-03 DIAGNOSIS — Z1231 Encounter for screening mammogram for malignant neoplasm of breast: Secondary | ICD-10-CM

## 2011-05-11 NOTE — Op Note (Signed)
NAMECLAYTON, JARMON A                           ACCOUNT NO.:  [id number]   MEDICAL RECORD NO.:  [id number]                   PATIENT TYPE:  AMB   LOCATION:  ENDO                                 FACILITY:  [facility]   PHYSICIAN:  Sandria Bales. Ezzard Standing, M.D.               DATE OF BIRTH:  Aug 24, 1936   DATE OF PROCEDURE:  01/19/2003  DATE OF DISCHARGE:                                 OPERATIVE REPORT   OFFICE RECORD NUMBER:  [id number]   PREOPERATIVE DIAGNOSIS:  A family history colon cancer with a brother who  had a recently diagnosed colon cancer.   POSTOPERATIVE DIAGNOSIS:  Scattered sigmoid colon diverticulosis but no  evidence of polyp or lesion.   PROCEDURE:  Flexible colonoscopy.   SURGEON:  Sandria Bales. Ezzard Standing, M.D.   FIRST ASSISTANT:  None.   ANESTHESIA:  50 mg of Demerol, 4 mg of Versed.   COMPLICATIONS:  None.   INDICATIONS FOR PROCEDURE:  Ms. Frumkin is a 75 year old white female, who is  a patient of Julieanne Manson, M.D., who comes with a family history of colon  cancer.  She is interested in having a colonoscopy.  I took care of her  brother, Marin Olp, who had __________ colon cancer.  She is now interested  in proceeding with this exam.  She has completed a GoLYTELY bowel prep at  home and now presents to the endoscopy suite. She has an IV in her right  wrist.  She is on telemetry, has nasal O2, has pulse oximetry, and her blood  pressure cuff on.   DESCRIPTION OF PROCEDURE:  She is given 50 mg of Demerol, 4 mg of Versed at  the initiation of the procedure.  I used an Olympus flexible colonoscope and  passed this around to the cecum without any difficulty.  I visualized the  ileocecal valve, saw light in the right lower quadrant.  Her right colon,  transverse colon, left colon were unremarkable except for maybe a little bit  of prominent vasculature.  She had some scattered sigmoid diverticula in her  left colon and in particular, her sigmoid colon, but I will say these are  fairly mild diverticula.  The scope was withdrawn into the rectum and  retroflexed.  She had no mass, no nodularity, no polyp.   IMPRESSION AND PLAN:  It is my impression she had just sigmoid  diverticulosis.  We will give her a high fiber diet sheet to work on and  have recommended next colonoscopy in 5-10 years, unless she were to have  additional symptoms or problems in the meantime.                                              Sandria Bales. Ezzard Standing, M.D.   DHN/MEDQ  D:  01/19/2003  T:  01/19/2003  Job:  [id number]   cc:   Franne Forts, M.D.

## 2011-05-25 ENCOUNTER — Encounter: Payer: Self-pay | Admitting: Family Medicine

## 2011-05-25 ENCOUNTER — Ambulatory Visit (INDEPENDENT_AMBULATORY_CARE_PROVIDER_SITE_OTHER): Payer: Medicare Other | Admitting: Family Medicine

## 2011-05-25 DIAGNOSIS — E039 Hypothyroidism, unspecified: Secondary | ICD-10-CM

## 2011-05-25 DIAGNOSIS — E785 Hyperlipidemia, unspecified: Secondary | ICD-10-CM

## 2011-05-25 DIAGNOSIS — I10 Essential (primary) hypertension: Secondary | ICD-10-CM

## 2011-05-25 LAB — COMPREHENSIVE METABOLIC PANEL
AST: 16 U/L (ref 0–37)
Albumin: 4.4 g/dL (ref 3.5–5.2)
Alkaline Phosphatase: 72 U/L (ref 39–117)
BUN: 17 mg/dL (ref 6–23)
Calcium: 9.6 mg/dL (ref 8.4–10.5)
Creat: 0.94 mg/dL (ref 0.50–1.10)
Sodium: 142 mEq/L (ref 135–145)
Total Bilirubin: 0.5 mg/dL (ref 0.3–1.2)

## 2011-05-25 LAB — LIPID PANEL
Cholesterol: 163 mg/dL (ref 0–200)
Triglycerides: 276 mg/dL — ABNORMAL HIGH (ref <150)
VLDL: 55 mg/dL — ABNORMAL HIGH (ref 0–40)

## 2011-05-25 MED ORDER — LEVOTHYROXINE SODIUM 88 MCG PO TABS: 62.5000 ug | ORAL_TABLET | Freq: Every day | ORAL | Status: DC

## 2011-05-25 MED ORDER — LISINOPRIL 10 MG PO TABS: 10.0000 mg | ORAL_TABLET | Freq: Every day | ORAL | Status: DC

## 2011-05-25 NOTE — Progress Notes (Signed)
  Subjective:    Patient ID: Kristina Carlson, female    DOB: August 02, 1936, 75 y.o.   MRN: [id number]  HPI  1. Hypothryroid: Rx Levothyroid 67.5 mg po daily. She denies heat or cold intolerance, diarrhea or constipation.   2. HLD: Rx Simvastatin. Denies myalgias.  3. Vitamin D Deficiency: On recent labs. Now taking calcium plus vitamin D plus and extra 2000 International Units of vitamin D QOD. Last DEXA > 3 years ago. "Normal" per patient.  4. HTN: Reviewed last several BPs with patient. She is okay with starting a medication today. Would prefer NOT to take HCTZ 2/2 increased urination.   Review of Systems SEE HPI. No CP, SOB, N/V/D/C, HA, dizziness, LE edema, rash.   Objective:   Physical Exam  Vitals reviewed. Constitutional: She is oriented to person, place, and time. She appears well-developed and well-nourished.  Neck: No JVD present. No thyromegaly present.  Cardiovascular: Normal rate, regular rhythm and intact distal pulses.   Pulmonary/Chest: Breath sounds normal.  Abdominal: Bowel sounds are normal.  Musculoskeletal: She exhibits no edema.  Neurological: She is alert and oriented to person, place, and time. She has normal reflexes. No cranial nerve deficit. Coordination normal.  Skin: Skin is warm and dry.  Psychiatric: She has a normal mood and affect.      Assessment & Plan:

## 2011-05-25 NOTE — Assessment & Plan Note (Signed)
Recheck TSH 

## 2011-05-25 NOTE — Patient Instructions (Signed)
It was nice to see you today!  We are checking labs.  I am adding a new medication - Lisinopril.   Please follow up in 3 weeks for blood pressure and lab check.

## 2011-05-25 NOTE — Assessment & Plan Note (Signed)
Starting low-dose ACE. Will recheck BP and BMP in 2-3 weeks.

## 2011-05-25 NOTE — Assessment & Plan Note (Signed)
Recheck FLP, CMP.

## 2011-05-28 ENCOUNTER — Encounter: Payer: Self-pay | Admitting: Family Medicine

## 2011-06-15 ENCOUNTER — Other Ambulatory Visit: Payer: Self-pay | Admitting: Family Medicine

## 2011-06-15 ENCOUNTER — Other Ambulatory Visit: Payer: Medicare Other

## 2011-06-15 ENCOUNTER — Ambulatory Visit (INDEPENDENT_AMBULATORY_CARE_PROVIDER_SITE_OTHER): Payer: Medicare Other | Admitting: *Deleted

## 2011-06-15 VITALS — BP 156/88 | HR 64

## 2011-06-15 DIAGNOSIS — I10 Essential (primary) hypertension: Secondary | ICD-10-CM

## 2011-06-15 LAB — BASIC METABOLIC PANEL
BUN: 13 mg/dL (ref 6–23)
Calcium: 9.8 mg/dL (ref 8.4–10.5)
Creat: 0.91 mg/dL (ref 0.50–1.10)
Potassium: 4.3 mEq/L (ref 3.5–5.3)
Sodium: 143 mEq/L (ref 135–145)

## 2011-06-15 MED ORDER — LISINOPRIL-HYDROCHLOROTHIAZIDE 10-12.5 MG PO TABS: 1.0000 | ORAL_TABLET | Freq: Every day | ORAL | Status: DC

## 2011-06-15 NOTE — Progress Notes (Signed)
Bmp done today Torie Priebe 

## 2011-06-15 NOTE — Progress Notes (Signed)
In for labs and BP check today . She has taken BP med this AM. BP checked manually using regular adult cuff. BP  Left 156/90, RA 156/88 pulse 64. Dr. Earlene Plater notified and she came in to talk with patient.  Will have her return in one month for repeat labs. Change med to lisinopril/ HCTZ 10/12.5.   Dr. Earlene Plater sent in.

## 2011-07-13 ENCOUNTER — Other Ambulatory Visit: Payer: Medicare Other

## 2011-07-13 VITALS — BP 138/80 | HR 72

## 2011-07-13 DIAGNOSIS — I10 Essential (primary) hypertension: Secondary | ICD-10-CM

## 2011-07-13 LAB — BASIC METABOLIC PANEL
CO2: 28 mEq/L (ref 19–32)
Calcium: 10.5 mg/dL (ref 8.4–10.5)
Chloride: 100 mEq/L (ref 96–112)
Creat: 1.01 mg/dL (ref 0.50–1.10)
Glucose, Bld: 89 mg/dL (ref 70–99)
Potassium: 4.2 mEq/L (ref 3.5–5.3)
Sodium: 139 mEq/L (ref 135–145)

## 2011-07-13 NOTE — Progress Notes (Signed)
Patient in for labs and ask for BP to be checked manually using regular adult cuff. BP RA  138/80   Pulse 72. Will forward to MD.  DREW LABS FOR BMP CNEWSOME

## 2011-09-05 ENCOUNTER — Ambulatory Visit (INDEPENDENT_AMBULATORY_CARE_PROVIDER_SITE_OTHER): Payer: Medicare Other | Admitting: Family Medicine

## 2011-09-05 ENCOUNTER — Encounter: Payer: Self-pay | Admitting: Family Medicine

## 2011-09-05 VITALS — BP 138/81 | HR 119 | Temp 98.4°F | Ht 65.0 in | Wt 165.0 lb

## 2011-09-05 DIAGNOSIS — I10 Essential (primary) hypertension: Secondary | ICD-10-CM

## 2011-09-05 DIAGNOSIS — Z7189 Other specified counseling: Secondary | ICD-10-CM

## 2011-09-05 DIAGNOSIS — E039 Hypothyroidism, unspecified: Secondary | ICD-10-CM

## 2011-09-05 DIAGNOSIS — Z23 Encounter for immunization: Secondary | ICD-10-CM

## 2011-09-05 DIAGNOSIS — E785 Hyperlipidemia, unspecified: Secondary | ICD-10-CM

## 2011-09-05 MED ORDER — LOSARTAN POTASSIUM 50 MG PO TABS: 50.0000 mg | ORAL_TABLET | Freq: Every day | ORAL | Status: DC

## 2011-09-05 MED ORDER — LEVOTHYROXINE SODIUM 125 MCG PO TABS: 62.5000 ug | ORAL_TABLET | Freq: Every day | ORAL | Status: DC

## 2011-09-05 NOTE — Patient Instructions (Signed)
Blood pressure: Stop taking the lisinopril/hctc. Start taking Losartan (cozaar) daily. Go to drug store and check bp 1-2 per week.   We will give you the flu shot and the tetanus shot today.   Return in 2-3 weeks (in September) on a Thursday for the geriatric clinic.

## 2011-09-08 NOTE — Assessment & Plan Note (Signed)
Pt received flu and tetanus vaccines today. States she has had colonoscopy but I do not have record on review of chart.   Will ask pt to have records sent to my office.

## 2011-09-08 NOTE — Assessment & Plan Note (Signed)
WNL- continue zocor.

## 2011-09-08 NOTE — Progress Notes (Signed)
Addended by: Kristen Cardinal on: 09/08/2011 02:57 PM   Modules accepted: Orders, Medications

## 2011-09-08 NOTE — Assessment & Plan Note (Signed)
TSH wnl- continue synthyroid at current dosage.

## 2011-09-08 NOTE — Assessment & Plan Note (Signed)
Dry cough since taking lisinopril. Patient is unsure of when the cough started but seems to think it correlates with when she began taking blood pressure tablet. Will change patient to ARB -losartan

## 2011-09-08 NOTE — Progress Notes (Signed)
  Subjective:    Patient ID: Kristina Carlson, female    DOB: Dec 14, 1936, 75 y.o.   MRN: [id number]  HPI Blood pressure: Blood pressure 138/81 patient is taking blood pressure medication as directed  Cough: Patient states she has noticed cough since taking ACE inhibitor blood pressure medication. Describes as dry cough. Nonproductive. No runny nose. No fever. No other signs or symptoms of cold. No lip edema.  Health maintenance: Patient agrees to receive flu shot today. Patient states she is up-to-date on colonoscopy unsure of date of procedure. Lab work drawn in June 2012. TSH, fasting lipid panel and metabolic panel within normal limits. Reviewed labs with patient.     Review of Systems The chest pain. No shortness of breath. As per above history of present illness.    Objective:   Physical Exam  Constitutional: She is oriented to person, place, and time. She appears well-developed and well-nourished.  HENT:  Head: Normocephalic and atraumatic.  Eyes: Pupils are equal, round, and reactive to light.  Neck: No thyromegaly present.  Cardiovascular: Normal rate and regular rhythm.   No murmur heard. Pulmonary/Chest: Effort normal and breath sounds normal. No respiratory distress. She has no wheezes.  Abdominal: Soft. She exhibits no distension. There is no tenderness.  Musculoskeletal: She exhibits no edema.  Neurological: She is alert and oriented to person, place, and time.  Psychiatric: She has a normal mood and affect. Her behavior is normal.          Assessment & Plan:

## 2012-02-28 ENCOUNTER — Other Ambulatory Visit: Payer: Self-pay | Admitting: Family Medicine

## 2012-02-29 NOTE — Telephone Encounter (Signed)
Refill request

## 2012-03-06 ENCOUNTER — Ambulatory Visit (INDEPENDENT_AMBULATORY_CARE_PROVIDER_SITE_OTHER): Payer: Medicare Other | Admitting: Family Medicine

## 2012-03-06 ENCOUNTER — Encounter: Payer: Self-pay | Admitting: Family Medicine

## 2012-03-06 VITALS — BP 136/78 | HR 88 | Ht 65.0 in | Wt 163.7 lb

## 2012-03-06 DIAGNOSIS — I10 Essential (primary) hypertension: Secondary | ICD-10-CM

## 2012-03-06 DIAGNOSIS — E039 Hypothyroidism, unspecified: Secondary | ICD-10-CM

## 2012-03-06 DIAGNOSIS — Z0000 Encounter for general adult medical examination without abnormal findings: Secondary | ICD-10-CM

## 2012-03-06 MED ORDER — LOSARTAN POTASSIUM 50 MG PO TABS: 50.0000 mg | ORAL_TABLET | Freq: Every day | ORAL | Status: DC

## 2012-03-06 NOTE — Progress Notes (Signed)
  Subjective:    Patient ID: Kristina Carlson, female    DOB: August 26, 1936, 76 y.o.   MRN: [id number]  HPI Patient here for regular followup:  Blood pressure: Patient states she has done well with losartan. Dry cough now resolved. Blood pressure 136/78 today. Check for pressure at home and usually is in the 1:30 systolic occasionally will go up to 140 systolic. No dizziness. No syncope. No headaches.  Health maintenance screening: Patient refuses DEXA scan-just states she does not want this at this time. Zostavax-patient states she will think about this, handout given. Patient states she's up-to-date on colonoscopy-will ask GI Dr. to send the results to my office.  Thyroid followup: Patient has history of hypothyroidism. She states that she has not had any symptoms of hypo-or hyper per thyroidism. No cold intolerance. No heat intolerance. No hair loss. No constipation. No palpitations. Good energy level. Normal appetite.  Secondhand smoke exposure: Patient states that her husband continues to smoke. He does not feel he is able to quit.      Review of Systems As per above.    Objective:   Physical Exam  Constitutional: She appears well-developed and well-nourished.  HENT:  Head: Normocephalic and atraumatic.  Neck: Normal range of motion. No thyromegaly present.  Cardiovascular: Normal rate, regular rhythm and normal heart sounds.   No murmur heard. Pulmonary/Chest: Effort normal and breath sounds normal. No respiratory distress. She has no wheezes.  Musculoskeletal: She exhibits no edema.  Neurological: She is alert.  Skin: No rash noted.  Psychiatric: She has a normal mood and affect.          Assessment & Plan:

## 2012-03-06 NOTE — Patient Instructions (Signed)
On your way out, please sign a release form so that I can get colonoscopy results from Dr. Ezzard Standing.  Blood pressure: Looks great! Try to increase exercise.  We will recheck your labs at your next appointment.

## 2012-03-11 DIAGNOSIS — Z0000 Encounter for general adult medical examination without abnormal findings: Secondary | ICD-10-CM | POA: Insufficient documentation

## 2012-03-11 NOTE — Assessment & Plan Note (Signed)
No symptoms of hypo or hyperthyroidism. Discussed with pt- decided to screen TSH at next appt in 3 months.

## 2012-03-11 NOTE — Assessment & Plan Note (Signed)
Will continue the losartan since pt is tolerating well.  Dry cough resolved after stopping lisinopril.   bp 136/78 today. Pt bp goal is less than 140/90. Pt to continue to monitor at home.

## 2012-03-11 NOTE — Assessment & Plan Note (Signed)
Health maintenance screening: Patient refuses DEXA scan-just states she does not want this at this time. Zostavax-patient states she will think about this, handout given. Patient states she's up-to-date on colonoscopy-will ask GI Dr. to send the results to my office.

## 2012-04-21 ENCOUNTER — Other Ambulatory Visit: Payer: Self-pay | Admitting: Family Medicine

## 2012-04-29 ENCOUNTER — Other Ambulatory Visit: Payer: Self-pay | Admitting: Family Medicine

## 2012-04-29 MED ORDER — LOSARTAN POTASSIUM 50 MG PO TABS: 50.0000 mg | ORAL_TABLET | Freq: Every day | ORAL | Status: DC

## 2012-05-22 ENCOUNTER — Other Ambulatory Visit: Payer: Self-pay | Admitting: Family Medicine

## 2012-05-22 DIAGNOSIS — Z1231 Encounter for screening mammogram for malignant neoplasm of breast: Secondary | ICD-10-CM

## 2012-06-05 ENCOUNTER — Ambulatory Visit
Admission: RE | Admit: 2012-06-05 | Discharge: 2012-06-05 | Disposition: A | Discharge status: Home / Self Care | Payer: Medicare Other | Source: Ambulatory Visit | Attending: Family Medicine | Admitting: Family Medicine

## 2012-06-05 DIAGNOSIS — Z1231 Encounter for screening mammogram for malignant neoplasm of breast: Secondary | ICD-10-CM

## 2012-09-01 ENCOUNTER — Ambulatory Visit (INDEPENDENT_AMBULATORY_CARE_PROVIDER_SITE_OTHER): Payer: Medicare Other | Admitting: Family Medicine

## 2012-09-01 ENCOUNTER — Encounter: Payer: Self-pay | Admitting: Family Medicine

## 2012-09-01 VITALS — BP 160/76 | HR 71 | Temp 98.8°F | Ht 65.0 in | Wt 169.2 lb

## 2012-09-01 DIAGNOSIS — E039 Hypothyroidism, unspecified: Secondary | ICD-10-CM

## 2012-09-01 DIAGNOSIS — I10 Essential (primary) hypertension: Secondary | ICD-10-CM | POA: Insufficient documentation

## 2012-09-01 LAB — LIPID PANEL
Cholesterol: 163 mg/dL (ref 0–200)
Total CHOL/HDL Ratio: 4.3 Ratio
Triglycerides: 219 mg/dL — ABNORMAL HIGH (ref <150)
VLDL: 44 mg/dL — ABNORMAL HIGH (ref 0–40)

## 2012-09-01 LAB — COMPREHENSIVE METABOLIC PANEL
Alkaline Phosphatase: 77 U/L (ref 39–117)
BUN: 19 mg/dL (ref 6–23)
Glucose, Bld: 96 mg/dL (ref 70–99)
Total Bilirubin: 0.5 mg/dL (ref 0.3–1.2)

## 2012-09-01 LAB — CBC
MCH: 32.2 pg (ref 26.0–34.0)
RBC: 4.35 MIL/uL (ref 3.87–5.11)
RDW: 13.6 % (ref 11.5–15.5)

## 2012-09-01 MED ORDER — LEVOTHYROXINE SODIUM 125 MCG PO TABS: 62.5000 ug | ORAL_TABLET | Freq: Every day | ORAL | Status: DC

## 2012-09-01 MED ORDER — LOSARTAN POTASSIUM-HCTZ 50-12.5 MG PO TABS: 1.0000 | ORAL_TABLET | Freq: Every day | ORAL | Status: DC

## 2012-09-01 NOTE — Assessment & Plan Note (Signed)
Will draw TSH today.

## 2012-09-01 NOTE — Progress Notes (Signed)
  Subjective:    Patient ID: Kristina Carlson, female    DOB: 1936/10/21, 76 y.o.   MRN: [id number]  HPI  Patient presents to clinic to meet new MD and medication refills.  Hypertension: Patient's BP is elevated today 160/76.  She checks BP at home and it also has been elevated.  She takes Cozaar 50 mg daily.  However, she does not adhere to a low sodium diet.  She states that over the weekend, she was a reunion and ate very unhealthy foods.  Patient does not exercise either.  She has gained 5 lb since March/last visit.  Patient denies any HA, CP, SOB, nausea vomiting, or paraesthesias.  She has had recent cataract surgery and vision is improving.  Hypothyroidism: Patient denies any symptoms of hyper or hypo-thyroidism.  She takes Synthroid 62.5 mcg without any side effects.  She has gained weight but she says this is likely due to poor diet.  She is here for TSH level today.  Review of Systems Per HPI    Objective:   Physical Exam  Constitutional: No distress.  Neck: Normal range of motion. Neck supple. No thyromegaly present.       No carotid bruits  Cardiovascular: Normal rate and regular rhythm.   No murmur heard. Pulmonary/Chest: Effort normal and breath sounds normal. She has no wheezes. She has no rales.  Musculoskeletal: She exhibits no edema.  Skin: Skin is warm.          Assessment & Plan:

## 2012-09-01 NOTE — Patient Instructions (Addendum)
It was nice to meet today, Kristina Carlson. Start taking combination pill for elevated blood pressure. We will call or send you a letter with recent lab work. Please read below regarding low sodium diet. Schedule follow up appointment with me next month for blood pressure and flu shots.  DASH Diet The DASH diet stands for "Dietary Approaches to Stop Hypertension." It is a healthy eating plan that has been shown to reduce high blood pressure (hypertension) in as little as 14 days, while also possibly providing other significant health benefits. These other health benefits include reducing the risk of breast cancer after menopause and reducing the risk of type 2 diabetes, heart disease, colon cancer, and stroke. Health benefits also include weight loss and slowing kidney failure in patients with chronic kidney disease.  DIET GUIDELINES  Limit salt (sodium). Your diet should contain less than 1500 mg of sodium daily.   Limit refined or processed carbohydrates. Your diet should include mostly whole grains. Desserts and added sugars should be used sparingly.   Include small amounts of heart-healthy fats. These types of fats include nuts, oils, and tub margarine. Limit saturated and trans fats. These fats have been shown to be harmful in the body.  CHOOSING FOODS  The following food groups are based on a 2000 calorie diet. See your Registered Dietitian for individual calorie needs. Grains and Grain Products (6 to 8 servings daily)  Eat More Often: Whole-wheat bread, brown rice, whole-grain or wheat pasta, quinoa, popcorn without added fat or salt (air popped).   Eat Less Often: White bread, white pasta, white rice, cornbread.  Vegetables (4 to 5 servings daily)  Eat More Often: Fresh, frozen, and canned vegetables. Vegetables may be raw, steamed, roasted, or grilled with a minimal amount of fat.   Eat Less Often/Avoid: Creamed or fried vegetables. Vegetables in a cheese sauce.  Fruit (4 to 5 servings  daily)  Eat More Often: All fresh, canned (in natural juice), or frozen fruits. Dried fruits without added sugar. One hundred percent fruit juice ( cup [237 mL] daily).   Eat Less Often: Dried fruits with added sugar. Canned fruit in light or heavy syrup.  [employer], Fish, and Poultry (2 servings or less daily. One serving is 3 to 4 oz [85-114 g]).  Eat More Often: Ninety percent or leaner ground beef, tenderloin, sirloin. Round cuts of beef, chicken breast, [place] breast. All fish. Grill, bake, or broil your meat. Nothing should be fried.   Eat Less Often/Avoid: Fatty cuts of meat, [place], or chicken leg, thigh, or wing. Fried cuts of meat or fish.  Dairy (2 to 3 servings)  Eat More Often: Low-fat or fat-free milk, low-fat plain or light yogurt, reduced-fat or part-skim cheese.   Eat Less Often/Avoid: Milk (whole, 2%, skim, or chocolate).Whole milk yogurt. Full-fat cheeses.  Nuts, Seeds, and Legumes (4 to 5 servings per week)  Eat More Often: All without added salt.   Eat Less Often/Avoid: Salted nuts and seeds, canned beans with added salt.  Fats and Sweets (limited)  Eat More Often: Vegetable oils, tub margarines without trans fats, sugar-free gelatin. Mayonnaise and salad dressings.   Eat Less Often/Avoid: Coconut oils, palm oils, butter, stick margarine, cream, half and half, cookies, candy, pie.  FOR MORE INFORMATION The Dash Diet Eating Plan: www.dashdiet.org Document Released: 11/29/2011 Document Reviewed: 11/19/2011 [facility] Patient Information 2012 [place], [place].

## 2012-09-01 NOTE — Assessment & Plan Note (Signed)
BP elevated today and at home per patient. Will add HCTZ 12.5 mg to Losartan. Will draw CBC, CMET, Lipid panel today. Recheck BP in one month. Counseled patient on DASH diet and lifestyle modifications.

## 2012-09-07 ENCOUNTER — Encounter: Payer: Self-pay | Admitting: Family Medicine

## 2013-02-18 ENCOUNTER — Other Ambulatory Visit: Payer: Self-pay | Admitting: Family Medicine

## 2013-05-19 ENCOUNTER — Other Ambulatory Visit: Payer: Self-pay | Admitting: *Deleted

## 2013-05-19 MED ORDER — SIMVASTATIN 20 MG PO TABS: ORAL_TABLET | ORAL | Status: DC

## 2013-07-27 ENCOUNTER — Other Ambulatory Visit: Payer: Self-pay

## 2013-07-27 DIAGNOSIS — Z1231 Encounter for screening mammogram for malignant neoplasm of breast: Secondary | ICD-10-CM

## 2013-08-10 ENCOUNTER — Ambulatory Visit
Admission: RE | Admit: 2013-08-10 | Discharge: 2013-08-10 | Disposition: A | Discharge status: Home / Self Care | Payer: Medicare Other | Source: Ambulatory Visit

## 2013-08-10 DIAGNOSIS — Z1231 Encounter for screening mammogram for malignant neoplasm of breast: Secondary | ICD-10-CM

## 2013-08-19 ENCOUNTER — Ambulatory Visit (INDEPENDENT_AMBULATORY_CARE_PROVIDER_SITE_OTHER): Payer: Medicare Other | Admitting: Family Medicine

## 2013-08-19 ENCOUNTER — Encounter: Payer: Self-pay | Admitting: Family Medicine

## 2013-08-19 VITALS — BP 144/78 | HR 89 | Temp 98.3°F | Ht 65.0 in | Wt 162.0 lb

## 2013-08-19 DIAGNOSIS — E785 Hyperlipidemia, unspecified: Secondary | ICD-10-CM

## 2013-08-19 DIAGNOSIS — E039 Hypothyroidism, unspecified: Secondary | ICD-10-CM

## 2013-08-19 DIAGNOSIS — I10 Essential (primary) hypertension: Secondary | ICD-10-CM

## 2013-08-19 LAB — CBC WITH DIFFERENTIAL/PLATELET
Lymphocytes Relative: 27 % (ref 12–46)
Lymphs Abs: 1.3 10*3/uL (ref 0.7–4.0)
MCV: 95.7 fL (ref 78.0–100.0)
Neutro Abs: 2.9 10*3/uL (ref 1.7–7.7)
Neutrophils Relative %: 63 % (ref 43–77)
Platelets: 273 10*3/uL (ref 150–400)
RBC: 4.21 MIL/uL (ref 3.87–5.11)
WBC: 4.7 10*3/uL (ref 4.0–10.5)

## 2013-08-19 LAB — BASIC METABOLIC PANEL
BUN: 15 mg/dL (ref 6–23)
Calcium: 9.6 mg/dL (ref 8.4–10.5)
Creat: 0.86 mg/dL (ref 0.50–1.10)
Potassium: 3.9 mEq/L (ref 3.5–5.3)

## 2013-08-19 MED ORDER — LOSARTAN POTASSIUM-HCTZ 50-12.5 MG PO TABS: 1.0000 | ORAL_TABLET | Freq: Every day | ORAL | Status: DC

## 2013-08-19 MED ORDER — LEVOTHYROXINE SODIUM 125 MCG PO TABS: 62.5000 ug | ORAL_TABLET | Freq: Every day | ORAL | Status: DC

## 2013-08-19 MED ORDER — SIMVASTATIN 20 MG PO TABS: ORAL_TABLET | ORAL | Status: DC

## 2013-08-19 NOTE — Progress Notes (Signed)
  Subjective:    Patient ID: Kristina Carlson, female    DOB: July 05, 1936, 77 y.o.   MRN: [id number]  HPI Pt here to follow up for chronic medical problem and meet new PCP  HTN: Takes meds regularly, checks occasionally at [facility] when it is 140s over 70s,  Denies Chest pain, HA, dyspnea, palpations, and edema Does not watch diet  Hypothyroidism Denies heat or cold intolerance, no hair or nail changes, no change in energy or mood Takes regularly  Had normal colonoscopy 10 years ago Some dry mouth at night, takes tylenol PM  Review of Systems Per HPI     Objective:   Physical Exam  Gen: NAD, alert, cooperative with exam, pleasent HEENT: NCAT CV: RRR, good S1/S2, no murmur Resp: CTABL, no wheezes, non-labored Abd: SNTND, BS present, no guarding or organomegaly Ext: No edema, warm Neuro: Alert and oriented, No gross deficits     Assessment & Plan:

## 2013-08-19 NOTE — Patient Instructions (Signed)
It was great to meet you! Lets follow up in 6 months, you need a repeat colonoscopy  I'll let you know how your labs turn out.

## 2013-08-19 NOTE — Assessment & Plan Note (Signed)
Controlled at 144/78 give her age, goal is 150/90 No red flags Continue Hyzaar  F/u 6 months

## 2013-08-19 NOTE — Assessment & Plan Note (Signed)
Discussed watching diet Continue zocor at current dose

## 2013-08-19 NOTE — Assessment & Plan Note (Signed)
No symptoms of dysfunction Draw TSH today and adjust accordingly

## 2013-08-21 ENCOUNTER — Encounter: Payer: Self-pay | Admitting: Family Medicine

## 2014-06-11 ENCOUNTER — Encounter: Payer: Self-pay | Admitting: Family Medicine

## 2014-06-11 ENCOUNTER — Ambulatory Visit (INDEPENDENT_AMBULATORY_CARE_PROVIDER_SITE_OTHER): Payer: Medicare Other | Admitting: Family Medicine

## 2014-06-11 VITALS — BP 159/77 | HR 87 | Temp 98.6°F | Resp 20 | Wt 161.0 lb

## 2014-06-11 DIAGNOSIS — R0789 Other chest pain: Secondary | ICD-10-CM

## 2014-06-11 NOTE — Progress Notes (Signed)
Kristina Carlson is a 78 y.o. female who presents today for ongoing L sided chest pain.  Pain began about 4 weeks ago, located left lower chest, intermittent, not substernal, not worse with activity or relieved by rest, no radiation, no chest pressure, no chest tenderness, no shortness of breath, no PND/pillow orthopnea, no exertional dyspnea.  She does not have hx of CHF/MI/FHx of early cardiac disease.  Does admit to increased stress since her husband was admitted to the hospital about 3 weeks ago.  She has been walking around the hospital without chest pain or having this pain when sitting in the hospital.  Tried some antacids without success.  No previous hx of anxiety or depression but does admit to increased stress with husband being in the hospital.  Previous mammogram was in 07/31/2013, BiRADS 1.  No FHx or personal hx of ovarian, uterine, or breast CA.   Past Medical History  Diagnosis Date  . Hyperlipidemia   . Hypertension   . Macular degeneration     -- Dr. Laurey Arrow (optometrist).  Taking Ocuvite.  . Insomnia   . Hypothyroid     History  Smoking status  . Passive Smoke Exposure - Never Smoker  Smokeless tobacco  . Not on file    Family History  Problem Relation Age of Onset  . Diabetes Brother     Current Outpatient Prescriptions on File Prior to Visit  Medication Sig Dispense Refill  . Calcium Carbonate-Vitamin D (CALTRATE 600+D) 600-400 MG-UNIT per tablet Take 2 tablets by mouth daily.        . diphenhydramine-acetaminophen (TYLENOL PM EXTRA STRENGTH) 25-500 MG TABS Take 1 tablet by mouth at bedtime as needed.        Marland Kitchen levothyroxine (SYNTHROID, LEVOTHROID) 125 MCG tablet Take 0.5 tablets (62.5 mcg total) by mouth daily.  90 tablet  3  . losartan-hydrochlorothiazide (HYZAAR) 50-12.5 MG per tablet Take 1 tablet by mouth daily.  90 tablet  3  . Multiple Vitamins-Minerals (OCUVITE PRESERVISION) TABS Take 2 tablets by mouth daily.        . simvastatin (ZOCOR) 20 MG tablet  TAKE 1 TABLET AT BEDTIME  90 tablet  3   No current facility-administered medications on file prior to visit.    ROS: Per HPI.  All other systems reviewed and are negative.   Physical Exam Filed Vitals:   06/11/14 1018  BP: 159/77  Pulse: 87  Temp: 98.6 F (37 C)  Resp: 20    Physical Examination: General appearance - alert, well appearing, and in no distress Neck - No JVD, no LAD Chest - clear to auscultation, no wheezes, rales or rhonchi, symmetric air entry Heart - normal rate and regular rhythm, no murmurs noted Abdomen - soft, nontender, nondistended, no masses or organomegaly Extremities - peripheral pulses normal, no pedal edema, no clubbing or cyanosis Skin - normal coloration and turgor, no rashes, no suspicious skin lesions noted Breast - No palpable mass or LAD on the L breast, no nipple galactorrhea.

## 2014-06-11 NOTE — Assessment & Plan Note (Addendum)
Pt story c/w anxiety related chest pain.  No cardiac or pulmonary concern today as her TIMI is 1, Well's 0/Pulse Ox unable to be obtained as pt walked out the building before could obtain.  Recommend trying some relaxation techniques as well as tylenol 650 mg TID-QID and f/u with Dr. Wendi Snipes in 2 weeks.  Explained red flags including worsening exertional dyspnea, chest pressure, blurred vision, weakness, or radiating chest pain that she needs to be evaluated immediately for in the ED or in clinic.  Pt voices understanding.

## 2014-06-11 NOTE — Patient Instructions (Signed)
Stress Stress-related medical problems are becoming increasingly common. The body has a built-in physical response to stressful situations. Faced with pressure, challenge or danger, we need to react quickly. Our bodies release hormones such as cortisol and adrenaline to help do this. These hormones are part of the "fight or flight" response and affect the metabolic rate, heart rate and blood pressure, resulting in a heightened, stressed state that prepares the body for optimum performance in dealing with a stressful situation. It is likely that early man required these mechanisms to stay alive, but usually modern stresses do not call for this, and the same hormones released in today's world can damage health and reduce coping ability. CAUSES  Pressure to perform at work, at school or in sports.  Threats of physical violence.  Money worries.  Arguments.  Family conflicts.  Divorce or separation from significant other.  Bereavement.  New job or unemployment.  Changes in location.  Alcohol or drug abuse. SOMETIMES, THERE IS NO PARTICULAR REASON FOR DEVELOPING STRESS. Almost all people are at risk of being stressed at some time in their lives. It is important to know that some stress is temporary and some is long term.  Temporary stress will go away when a situation is resolved. Most people can cope with short periods of stress, and it can often be relieved by relaxing, taking a walk or getting any type of exercise, chatting through issues with friends, or having a good night's sleep.  Chronic (long-term, continuous) stress is much harder to deal with. It can be psychologically and emotionally damaging. It can be harmful both for an individual and for friends and family. SYMPTOMS Everyone reacts to stress differently. There are some common effects that help us recognize it. In times of extreme stress, people may:  Shake uncontrollably.  Breathe faster and deeper than normal  (hyperventilate).  Vomit.  For people with asthma, stress can trigger an attack.  For some people, stress may trigger migraine headaches, ulcers, and body pain. PHYSICAL EFFECTS OF STRESS MAY INCLUDE:  Loss of energy.  Skin problems.  Aches and pains resulting from tense muscles, including neck ache, backache and tension headaches.  Increased pain from arthritis and other conditions.  Irregular heart beat (palpitations).  Periods of irritability or anger.  Apathy or depression.  Anxiety (feeling uptight or worrying).  Unusual behavior.  Loss of appetite.  Comfort eating.  Lack of concentration.  Loss of, or decreased, sex-drive.  Increased smoking, drinking, or recreational drug use.  For women, missed periods.  Ulcers, joint pain, and muscle pain. Post-traumatic stress is the stress caused by any serious accident, strong emotional damage, or extremely difficult or violent experience such as rape or war. Post-traumatic stress victims can experience mixtures of emotions such as fear, shame, depression, guilt or anger. It may include recurrent memories or images that may be haunting. These feelings can last for weeks, months or even years after the traumatic event that triggered them. Specialized treatment, possibly with medicines and psychological therapies, is available. If stress is causing physical symptoms, severe distress or making it difficult for you to function as normal, it is worth seeing your caregiver. It is important to remember that although stress is a usual part of life, extreme or prolonged stress can lead to other illnesses that will need treatment. It is better to visit a doctor sooner rather than later. Stress has been linked to the development of high blood pressure and heart disease, as well as insomnia and depression.   There is no diagnostic test for stress since everyone reacts to it differently. But a caregiver will be able to spot the physical  symptoms, such as:  Headaches.  Shingles.  Ulcers. Emotional distress such as intense worry, low mood or irritability should be detected when the doctor asks pertinent questions to identify any underlying problems that might be the cause. In case there are physical reasons for the symptoms, the doctor may also want to do some tests to exclude certain conditions. If you feel that you are suffering from stress, try to identify the aspects of your life that are causing it. Sometimes you may not be able to change or avoid them, but even a small change can have a positive ripple effect. A simple lifestyle change can make all the difference. STRATEGIES THAT CAN HELP DEAL WITH STRESS:  Delegating or sharing responsibilities.  Avoiding confrontations.  Learning to be more assertive.  Regular exercise.  Avoid using alcohol or street drugs to cope.  Eating a healthy, balanced diet, rich in fruit and vegetables and proteins.  Finding humor or absurdity in stressful situations.  Never taking on more than you know you can handle comfortably.  Organizing your time better to get as much done as possible.  Talking to friends or family and sharing your thoughts and fears.  Listening to music or relaxation tapes.  Relaxation techniques like deep breathing, meditation, and yoga.  Tensing and then relaxing your muscles, starting at the toes and working up to the head and neck. If you think that you would benefit from help, either in identifying the things that are causing your stress or in learning techniques to help you relax, see a caregiver who is capable of helping you with this. Rather than relying on medications, it is usually better to try and identify the things in your life that are causing stress and try to deal with them. There are many techniques of managing stress including counseling, psychotherapy, aromatherapy, yoga, and exercise. Your caregiver can help you determine what is best  for you. Document Released: 03/02/2003 Document Revised: 12/15/2013 Document Reviewed: 01/27/2008 [facility] Patient Information 2015 [place], [place]. This information is not intended to replace advice given to you by your health care provider. Make sure you discuss any questions you have with your health care provider.  tomach.  Dizziness, light-headedness, or feeling like you will faint.  Chills or hot flushes.  Numbness or tingling in your lips or hands and feet.  Feeling that things are not real or feeling that you are not yourself.  Fear of losing control or going crazy.  Fear of dying. Some of these symptoms can mimic serious medical conditions. For example, you may think you are having a heart attack. Although panic attacks can be very scary, they are not life threatening. DIAGNOSIS  Panic attacks are diagnosed through an assessment by your health care provider. Your health care provider will ask questions about your symptoms, such as where and when they occurred. Your health care provider will also ask about your medical history and use of alcohol and drugs, including prescription medicines. Your health care provider may order blood tests or other studies to rule out a serious medical condition. Your health care provider may refer you to a mental health professional for further evaluation. TREATMENT   Most healthy people who have one or two panic attacks in an extreme, life-threatening situation will not require treatment.  The treatment for panic attacks associated with anxiety disorders or other  mental illness typically involves counseling with a mental health professional, medicine, or a combination of both. Your health care provider will help determine what treatment is best for you.  Panic attacks due to physical illness usually go away with treatment of the illness. If prescription medicine is causing panic attacks, talk with your health care provider about stopping the  medicine, decreasing the dose, or substituting another medicine.  Panic attacks due to alcohol or drug abuse go away with abstinence. Some adults need professional help in order to stop drinking or using drugs. HOME CARE INSTRUCTIONS   Take all medicines as directed by your health care provider.   Schedule and attend follow-up visits as directed by your health care provider. It is important to keep all your appointments. SEEK MEDICAL CARE IF:  You are not able to take your medicines as prescribed.  Your symptoms do not improve or get worse. SEEK IMMEDIATE MEDICAL CARE IF:   You experience panic attack symptoms that are different than your usual symptoms.  You have serious thoughts about hurting yourself or others.  You are taking medicine for panic attacks and have a serious side effect. MAKE SURE YOU:  Understand these instructions.  Will watch your condition.  Will get help right away if you are not doing well or get worse. Document Released: 12/10/2005 Document Revised: 12/15/2013 Document Reviewed: 07/24/2013 [facility] Patient Information 2015 [place], [place]. This information is not intended to replace advice given to you by your health care provider. Make sure you discuss any questions you have with your health care provider.

## 2014-06-23 DIAGNOSIS — I2510 Atherosclerotic heart disease of native coronary artery without angina pectoris: Secondary | ICD-10-CM

## 2014-06-23 DIAGNOSIS — I25119 Atherosclerotic heart disease of native coronary artery with unspecified angina pectoris: Secondary | ICD-10-CM

## 2014-06-23 HISTORY — DX: Atherosclerotic heart disease of native coronary artery without angina pectoris: I25.10

## 2014-06-23 HISTORY — DX: Atherosclerotic heart disease of native coronary artery with unspecified angina pectoris: I25.119

## 2014-06-24 ENCOUNTER — Ambulatory Visit (HOSPITAL_COMMUNITY)
Admission: RE | Admit: 2014-06-24 | Discharge: 2014-06-24 | Disposition: A | Discharge status: Home / Self Care | Payer: Medicare Other | Source: Ambulatory Visit | Attending: Family Medicine | Admitting: Family Medicine

## 2014-06-24 ENCOUNTER — Ambulatory Visit (INDEPENDENT_AMBULATORY_CARE_PROVIDER_SITE_OTHER): Payer: Medicare Other | Admitting: Family Medicine

## 2014-06-24 ENCOUNTER — Encounter: Payer: Self-pay | Admitting: Family Medicine

## 2014-06-24 VITALS — BP 145/84 | HR 80 | Temp 97.8°F | Ht 65.0 in | Wt 159.0 lb

## 2014-06-24 DIAGNOSIS — R0789 Other chest pain: Secondary | ICD-10-CM

## 2014-06-24 DIAGNOSIS — R079 Chest pain, unspecified: Secondary | ICD-10-CM | POA: Insufficient documentation

## 2014-06-24 DIAGNOSIS — I10 Essential (primary) hypertension: Secondary | ICD-10-CM

## 2014-06-24 DIAGNOSIS — R9389 Abnormal findings on diagnostic imaging of other specified body structures: Secondary | ICD-10-CM | POA: Insufficient documentation

## 2014-06-24 LAB — COMPREHENSIVE METABOLIC PANEL
ALK PHOS: 73 U/L (ref 39–117)
ALT: 18 U/L (ref 0–35)
AST: 16 U/L (ref 0–37)
Albumin: 4.2 g/dL (ref 3.5–5.2)
BILIRUBIN TOTAL: 0.5 mg/dL (ref 0.2–1.2)
BUN: 13 mg/dL (ref 6–23)
CO2: 30 meq/L (ref 19–32)
CREATININE: 0.84 mg/dL (ref 0.50–1.10)
Calcium: 9.5 mg/dL (ref 8.4–10.5)
Chloride: 103 mEq/L (ref 96–112)
Glucose, Bld: 97 mg/dL (ref 70–99)
Potassium: 4.4 mEq/L (ref 3.5–5.3)
Sodium: 141 mEq/L (ref 135–145)
Total Protein: 6.8 g/dL (ref 6.0–8.3)

## 2014-06-24 LAB — CBC WITH DIFFERENTIAL/PLATELET
BASOS ABS: 0 10*3/uL (ref 0.0–0.1)
BASOS PCT: 1 % (ref 0–1)
EOS ABS: 0 10*3/uL (ref 0.0–0.7)
EOS PCT: 1 % (ref 0–5)
HCT: 40.6 % (ref 36.0–46.0)
Hemoglobin: 13.8 g/dL (ref 12.0–15.0)
LYMPHS ABS: 0.9 10*3/uL (ref 0.7–4.0)
Lymphocytes Relative: 23 % (ref 12–46)
MCH: 31.9 pg (ref 26.0–34.0)
MCHC: 34 g/dL (ref 30.0–36.0)
MCV: 93.8 fL (ref 78.0–100.0)
Monocytes Absolute: 0.2 10*3/uL (ref 0.1–1.0)
Monocytes Relative: 6 % (ref 3–12)
Neutro Abs: 2.7 10*3/uL (ref 1.7–7.7)
Neutrophils Relative %: 69 % (ref 43–77)
PLATELETS: 285 10*3/uL (ref 150–400)
RBC: 4.33 MIL/uL (ref 3.87–5.11)
RDW: 13.7 % (ref 11.5–15.5)
WBC: 3.9 10*3/uL — ABNORMAL LOW (ref 4.0–10.5)

## 2014-06-24 LAB — LIPID PANEL
CHOL/HDL RATIO: 4.3 ratio
Cholesterol: 163 mg/dL (ref 0–200)
HDL: 38 mg/dL — AB (ref >39)
LDL Cholesterol: 90 mg/dL (ref 0–99)
Triglycerides: 175 mg/dL — ABNORMAL HIGH (ref <150)
VLDL: 35 mg/dL (ref 0–40)

## 2014-06-24 LAB — TSH: TSH: 2.426 u[IU]/mL (ref 0.350–4.500)

## 2014-06-24 MED ORDER — PANTOPRAZOLE SODIUM 40 MG PO TBEC: 40.0000 mg | DELAYED_RELEASE_TABLET | Freq: Every day | ORAL | Status: DC

## 2014-06-24 NOTE — Patient Instructions (Addendum)
Great to see you today!  Lets follow up in 3-4 weeks when all of these tests are done, If we find any abnormalities warranting cardiology we will get that set up.   Chest Pain (Nonspecific) It is often hard to give a specific diagnosis for the cause of chest pain. There is always a chance that your pain could be related to something serious, such as a heart attack or a blood clot in the lungs. You need to follow up with your health care provider for further evaluation. CAUSES   Heartburn.  Pneumonia or bronchitis.  Anxiety or stress.  Inflammation around your heart (pericarditis) or lung (pleuritis or pleurisy).  A blood clot in the lung.  A collapsed lung (pneumothorax). It can develop suddenly on its own (spontaneous pneumothorax) or from trauma to the chest.  Shingles infection (herpes zoster virus). The chest wall is composed of bones, muscles, and cartilage. Any of these can be the source of the pain.  The bones can be bruised by injury.  The muscles or cartilage can be strained by coughing or overwork.  The cartilage can be affected by inflammation and become sore (costochondritis). DIAGNOSIS  Lab tests or other studies may be needed to find the cause of your pain. Your health care provider may have you take a test called an ambulatory electrocardiogram (ECG). An ECG records your heartbeat patterns over a 24-hour period. You may also have other tests, such as:  Transthoracic echocardiogram (TTE). During echocardiography, sound waves are used to evaluate how blood flows through your heart.  Transesophageal echocardiogram (TEE).  Cardiac monitoring. This allows your health care provider to monitor your heart rate and rhythm in real time.  Holter monitor. This is a portable device that records your heartbeat and can help diagnose heart arrhythmias. It allows your health care provider to track your heart activity for several days, if needed.  Stress tests by exercise or by  giving medicine that makes the heart beat faster. TREATMENT   Treatment depends on what may be causing your chest pain. Treatment may include:  Acid blockers for heartburn.  Anti-inflammatory medicine.  Pain medicine for inflammatory conditions.  Antibiotics if an infection is present.  You may be advised to change lifestyle habits. This includes stopping smoking and avoiding alcohol, caffeine, and chocolate.  You may be advised to keep your head raised (elevated) when sleeping. This reduces the chance of acid going backward from your stomach into your esophagus. Most of the time, nonspecific chest pain will improve within 2-3 days with rest and mild pain medicine.  HOME CARE INSTRUCTIONS   If antibiotics were prescribed, take them as directed. Finish them even if you start to feel better.  For the next few days, avoid physical activities that bring on chest pain. Continue physical activities as directed.  Do not use any tobacco products, including cigarettes, chewing tobacco, or electronic cigarettes.  Avoid drinking alcohol.  Only take medicine as directed by your health care provider.  Follow your health care provider's suggestions for further testing if your chest pain does not go away.  Keep any follow-up appointments you made. If you do not go to an appointment, you could develop lasting (chronic) problems with pain. If there is any problem keeping an appointment, call to reschedule. SEEK MEDICAL CARE IF:   Your chest pain does not go away, even after treatment.  You have a rash with blisters on your chest.  You have a fever. SEEK IMMEDIATE MEDICAL CARE IF:  You have increased chest pain or pain that spreads to your arm, neck, jaw, back, or abdomen.  You have shortness of breath.  You have an increasing cough, or you cough up blood.  You have severe back or abdominal pain.  You feel nauseous or vomit.  You have severe weakness.  You faint.  You have  chills. This is an emergency. Do not wait to see if the pain will go away. Get medical help at once. Call your local emergency services (911 in [place] Do not drive yourself to the hospital. MAKE SURE YOU:   Understand these instructions.  Will watch your condition.  Will get help right away if you are not doing well or get worse. Document Released: 09/19/2005 Document Revised: 12/15/2013 Document Reviewed: 07/15/2008 [facility] Patient Information 2015 [place], [place]. This information is not intended to replace advice given to you by your health care provider. Make sure you discuss any questions you have with your health care provider.

## 2014-06-24 NOTE — Assessment & Plan Note (Signed)
Reasonable control Continue current Hyzaar

## 2014-06-24 NOTE — Assessment & Plan Note (Addendum)
Unlikely to be cardiac in nature Some crackles on exam, CXR and TTE ordered EKG WNL, read as septal infarct but no changes consistent with that on my read.  F/u 1 month and will refer to cards if TTE abnormal F/u CXR Trial PPI for improvement due to possible GERD and worsening Symptoms at night.

## 2014-06-24 NOTE — Progress Notes (Signed)
Patient ID: EDOM SCHMUHL, female   DOB: [birth year], 78 y.o.   MRN: [id number]  Kenn File, MD Phone: [phone]  Subjective:  Chief complaint-noted  Pt Here for followup for chest pain   chest pain States it started a few months ago before her husband got sick. Described as the left-sided neural intermediate aching chest pain. It ranges from nagging chest pain to severe at night. She denies any exacerbation with exertion or any relief with rest. Tylenol and antacids have not helped. She would like to do as much cardiac workup about being sent to the cardiologist at this time. She denies any worsening with stressful mood and has not noticed any worsening after her husband's death. The pain is nonradiating most the time but sometimes radiates to the left shoulder and arm She is fasting today would like lab work done.   ROS-  Per HPI plus  No fever, chills, sweats Of dyspnea Positive chest pain as described above No constipation or vomiting or nausea No lower extremity weakness or edema   Past Medical History Patient Active Problem List   Diagnosis Date Noted  . Non-cardiac chest pain 06/11/2014  . Hypertension, essential, benign 09/01/2012  . Health maintenance examination 03/11/2012  . Health counseling 09/08/2011  . VITAMIN D DEFICIENCY 08/24/2010  . HYPERLIPIDEMIA 03/08/2010  . HYPOTHYROIDISM, UNSPECIFIED 02/20/2007  . INSOMNIA NOS 02/20/2007    Medications- reviewed and updated Current Outpatient Prescriptions  Medication Sig Dispense Refill  . Calcium Carbonate-Vitamin D (CALTRATE 600+D) 600-400 MG-UNIT per tablet Take 2 tablets by mouth daily.        . diphenhydramine-acetaminophen (TYLENOL PM EXTRA STRENGTH) 25-500 MG TABS Take 1 tablet by mouth at bedtime as needed.        Marland Kitchen levothyroxine (SYNTHROID, LEVOTHROID) 125 MCG tablet Take 0.5 tablets (62.5 mcg total) by mouth daily.  90 tablet  3  . losartan-hydrochlorothiazide (HYZAAR) 50-12.5 MG per tablet  Take 1 tablet by mouth daily.  90 tablet  3  . Multiple Vitamins-Minerals (OCUVITE PRESERVISION) TABS Take 2 tablets by mouth daily.        . simvastatin (ZOCOR) 20 MG tablet TAKE 1 TABLET AT BEDTIME  90 tablet  3   No current facility-administered medications for this visit.    Objective: BP 145/84  Pulse 80  Temp(Src) 97.8 F (36.6 C) (Oral)  Ht 5\' 5"  (1.651 m)  Wt 159 lb (72.122 kg)  BMI 26.46 kg/m2 Gen: NAD, alert, cooperative with exam HEENT: NCAT CV: RRR, good S1/S2, no murmur Resp: Slight crackles at the left lower base, nonlabored, otherwise clear MSK: Left chest and left shoulder nontender to palpation Neuro: Alert and oriented, No gross deficits  EKG 06/24/2014: Rate 67, axis normal, regular rhythm, nonspecific inverted T waves in V2 alone  Assessment/Plan:  Hypertension, essential, benign Reasonable control Continue current Hyzaar  Non-cardiac chest pain Unlikely to be cardiac in nature Some crackles on exam, CXR and TTE ordered EKG WNL, read as septal infarct but no changes consistent with that on my read.  F/u 1 month and will refer to cards if TTE abnormal F/u CXR    Orders Placed This Encounter  Procedures  . EKG 12-Lead

## 2014-06-28 ENCOUNTER — Telehealth: Payer: Self-pay | Admitting: Family Medicine

## 2014-06-28 DIAGNOSIS — R0789 Other chest pain: Secondary | ICD-10-CM | POA: Insufficient documentation

## 2014-06-28 MED ORDER — ASPIRIN EC 81 MG PO TBEC: 81.0000 mg | DELAYED_RELEASE_TABLET | Freq: Every day | ORAL | Status: DC

## 2014-06-28 NOTE — Telephone Encounter (Signed)
Called to discuss labs and EKG.   Labs all largely WNL. EKG computer read was previous septal infarct which has been confirmed by the cardiologist's read. Given the previous infarct I think it makes her atypical chest pain more likely to be cardiac in origin.   Given that I recommended cardiology referral which she agrees to.  Laroy Apple, MD [facility] Resident, PGY-3 06/28/2014, 5:48 PM

## 2014-06-29 ENCOUNTER — Emergency Department (HOSPITAL_COMMUNITY): Payer: Medicare Other

## 2014-06-29 ENCOUNTER — Observation Stay (HOSPITAL_COMMUNITY)
Admission: EM | Admit: 2014-06-29 | Discharge: 2014-06-30 | Disposition: A | Discharge status: Home / Self Care | Payer: Medicare Other | Attending: Family Medicine | Admitting: Family Medicine

## 2014-06-29 ENCOUNTER — Encounter (HOSPITAL_COMMUNITY)
Admission: EM | Disposition: A | Discharge status: Home / Self Care | Payer: Self-pay | Source: Home / Self Care | Attending: Family Medicine

## 2014-06-29 ENCOUNTER — Encounter (HOSPITAL_COMMUNITY): Payer: Self-pay | Admitting: Emergency Medicine

## 2014-06-29 DIAGNOSIS — I200 Unstable angina: Secondary | ICD-10-CM

## 2014-06-29 DIAGNOSIS — Z7982 Long term (current) use of aspirin: Secondary | ICD-10-CM | POA: Insufficient documentation

## 2014-06-29 DIAGNOSIS — K219 Gastro-esophageal reflux disease without esophagitis: Secondary | ICD-10-CM | POA: Insufficient documentation

## 2014-06-29 DIAGNOSIS — I25118 Atherosclerotic heart disease of native coronary artery with other forms of angina pectoris: Secondary | ICD-10-CM | POA: Diagnosis present

## 2014-06-29 DIAGNOSIS — I10 Essential (primary) hypertension: Secondary | ICD-10-CM | POA: Diagnosis not present

## 2014-06-29 DIAGNOSIS — I2510 Atherosclerotic heart disease of native coronary artery without angina pectoris: Secondary | ICD-10-CM | POA: Insufficient documentation

## 2014-06-29 DIAGNOSIS — Z634 Disappearance and death of family member: Secondary | ICD-10-CM

## 2014-06-29 DIAGNOSIS — R079 Chest pain, unspecified: Secondary | ICD-10-CM | POA: Diagnosis not present

## 2014-06-29 DIAGNOSIS — E7800 Pure hypercholesterolemia, unspecified: Secondary | ICD-10-CM | POA: Insufficient documentation

## 2014-06-29 DIAGNOSIS — E039 Hypothyroidism, unspecified: Secondary | ICD-10-CM | POA: Diagnosis present

## 2014-06-29 DIAGNOSIS — R0789 Other chest pain: Secondary | ICD-10-CM | POA: Diagnosis not present

## 2014-06-29 DIAGNOSIS — G4700 Insomnia, unspecified: Secondary | ICD-10-CM | POA: Insufficient documentation

## 2014-06-29 DIAGNOSIS — H3530 Unspecified macular degeneration: Secondary | ICD-10-CM | POA: Insufficient documentation

## 2014-06-29 DIAGNOSIS — I25119 Atherosclerotic heart disease of native coronary artery with unspecified angina pectoris: Secondary | ICD-10-CM | POA: Diagnosis present

## 2014-06-29 DIAGNOSIS — E785 Hyperlipidemia, unspecified: Secondary | ICD-10-CM | POA: Diagnosis not present

## 2014-06-29 DIAGNOSIS — E782 Mixed hyperlipidemia: Secondary | ICD-10-CM | POA: Diagnosis present

## 2014-06-29 HISTORY — PX: LEFT HEART CATHETERIZATION WITH CORONARY ANGIOGRAM: SHX5451

## 2014-06-29 LAB — TROPONIN I
Troponin I: <0.3 ng/mL (ref <0.30)
Troponin I: <0.3 ng/mL (ref <0.30)

## 2014-06-29 LAB — BASIC METABOLIC PANEL
ANION GAP: 15 (ref 5–15)
BUN: 11 mg/dL (ref 6–23)
CO2: 27 mEq/L (ref 19–32)
Calcium: 9.6 mg/dL (ref 8.4–10.5)
Chloride: 100 mEq/L (ref 96–112)
Creatinine, Ser: 0.85 mg/dL (ref 0.50–1.10)
GFR, EST AFRICAN AMERICAN: 76 mL/min — AB (ref >90)
GFR, EST NON AFRICAN AMERICAN: 65 mL/min — AB (ref >90)
Glucose, Bld: 129 mg/dL — ABNORMAL HIGH (ref 70–99)
POTASSIUM: 3.8 meq/L (ref 3.7–5.3)
Sodium: 142 mEq/L (ref 137–147)

## 2014-06-29 LAB — CBC WITH DIFFERENTIAL/PLATELET
BASOS ABS: 0 10*3/uL (ref 0.0–0.1)
BASOS PCT: 0 % (ref 0–1)
Eosinophils Absolute: 0.1 10*3/uL (ref 0.0–0.7)
Eosinophils Relative: 1 % (ref 0–5)
HCT: 41.6 % (ref 36.0–46.0)
HEMOGLOBIN: 13.6 g/dL (ref 12.0–15.0)
Lymphocytes Relative: 11 % — ABNORMAL LOW (ref 12–46)
Lymphs Abs: 0.7 10*3/uL (ref 0.7–4.0)
MCH: 31.7 pg (ref 26.0–34.0)
MCHC: 32.7 g/dL (ref 30.0–36.0)
MCV: 97 fL (ref 78.0–100.0)
MONOS PCT: 6 % (ref 3–12)
Monocytes Absolute: 0.4 10*3/uL (ref 0.1–1.0)
NEUTROS PCT: 82 % — AB (ref 43–77)
Neutro Abs: 5.6 10*3/uL (ref 1.7–7.7)
Platelets: 234 10*3/uL (ref 150–400)
RBC: 4.29 MIL/uL (ref 3.87–5.11)
RDW: 13.1 % (ref 11.5–15.5)
WBC: 6.8 10*3/uL (ref 4.0–10.5)

## 2014-06-29 LAB — I-STAT TROPONIN, ED: Troponin i, poc: 0 ng/mL (ref 0.00–0.08)

## 2014-06-29 SURGERY — LEFT HEART CATHETERIZATION WITH CORONARY ANGIOGRAM
Anesthesia: LOCAL

## 2014-06-29 MED ORDER — MIDAZOLAM HCL 2 MG/2ML IJ SOLN
INTRAMUSCULAR | Status: AC
  Filled 2014-06-29: qty 2

## 2014-06-29 MED ORDER — HEPARIN SODIUM (PORCINE) 1000 UNIT/ML IJ SOLN
INTRAMUSCULAR | Status: AC
  Filled 2014-06-29: qty 1

## 2014-06-29 MED ORDER — LOSARTAN POTASSIUM-HCTZ 50-12.5 MG PO TABS: 1.0000 | ORAL_TABLET | Freq: Every day | ORAL | Status: DC

## 2014-06-29 MED ORDER — FENTANYL CITRATE 0.05 MG/ML IJ SOLN
INTRAMUSCULAR | Status: AC
  Filled 2014-06-29: qty 2

## 2014-06-29 MED ORDER — ACETAMINOPHEN 325 MG PO TABS
650.0000 mg | ORAL_TABLET | ORAL | Status: DC | PRN
  Administered 2014-06-30: 650 mg via ORAL
  Filled 2014-06-29: qty 2

## 2014-06-29 MED ORDER — ONDANSETRON HCL 4 MG/2ML IJ SOLN: 4.0000 mg | Freq: Four times a day (QID) | INTRAMUSCULAR | Status: DC | PRN

## 2014-06-29 MED ORDER — LEVOTHYROXINE SODIUM 125 MCG PO TABS
62.5000 ug | ORAL_TABLET | Freq: Every day | ORAL | Status: DC
  Administered 2014-06-30: 62.5 ug via ORAL
  Filled 2014-06-29 (×2): qty 0.5

## 2014-06-29 MED ORDER — VERAPAMIL HCL 2.5 MG/ML IV SOLN
INTRAVENOUS | Status: AC
  Filled 2014-06-29: qty 2

## 2014-06-29 MED ORDER — SODIUM CHLORIDE 0.9 % IV SOLN
INTRAVENOUS | Status: DC
  Administered 2014-06-29: 15:00:00 via INTRAVENOUS

## 2014-06-29 MED ORDER — MORPHINE SULFATE 2 MG/ML IJ SOLN: 2.0000 mg | INTRAMUSCULAR | Status: DC | PRN

## 2014-06-29 MED ORDER — GI COCKTAIL ~~LOC~~
30.0000 mL | Freq: Four times a day (QID) | ORAL | Status: DC | PRN
  Administered 2014-06-30: 30 mL via ORAL
  Filled 2014-06-29: qty 30

## 2014-06-29 MED ORDER — HEPARIN SODIUM (PORCINE) 5000 UNIT/ML IJ SOLN
5000.0000 [IU] | Freq: Three times a day (TID) | INTRAMUSCULAR | Status: DC
  Filled 2014-06-29 (×3): qty 1

## 2014-06-29 MED ORDER — ASPIRIN EC 81 MG PO TBEC
81.0000 mg | DELAYED_RELEASE_TABLET | Freq: Every day | ORAL | Status: DC
  Administered 2014-06-30: 81 mg via ORAL
  Filled 2014-06-29 (×2): qty 1

## 2014-06-29 MED ORDER — SIMVASTATIN 20 MG PO TABS
20.0000 mg | ORAL_TABLET | Freq: Every day | ORAL | Status: DC
  Administered 2014-06-29: 20 mg via ORAL
  Filled 2014-06-29 (×2): qty 1

## 2014-06-29 MED ORDER — PANTOPRAZOLE SODIUM 40 MG PO TBEC
40.0000 mg | DELAYED_RELEASE_TABLET | Freq: Every day | ORAL | Status: DC
  Administered 2014-06-30: 40 mg via ORAL
  Filled 2014-06-29: qty 1

## 2014-06-29 MED ORDER — LIDOCAINE HCL (PF) 1 % IJ SOLN
INTRAMUSCULAR | Status: AC
  Filled 2014-06-29: qty 30

## 2014-06-29 MED ORDER — ASPIRIN 81 MG PO CHEW: 81.0000 mg | CHEWABLE_TABLET | ORAL | Status: DC

## 2014-06-29 MED ORDER — HYDROCHLOROTHIAZIDE 12.5 MG PO CAPS
12.5000 mg | ORAL_CAPSULE | Freq: Every day | ORAL | Status: DC
  Administered 2014-06-30: 12.5 mg via ORAL
  Filled 2014-06-29: qty 1

## 2014-06-29 MED ORDER — SODIUM CHLORIDE 0.9 % IV SOLN
INTRAVENOUS | Status: AC
  Administered 2014-06-29: 17:00:00 via INTRAVENOUS

## 2014-06-29 MED ORDER — HEPARIN (PORCINE) IN NACL 2-0.9 UNIT/ML-% IJ SOLN
INTRAMUSCULAR | Status: AC
  Filled 2014-06-29: qty 1500

## 2014-06-29 MED ORDER — LOSARTAN POTASSIUM 50 MG PO TABS
50.0000 mg | ORAL_TABLET | Freq: Every day | ORAL | Status: DC
  Administered 2014-06-30: 50 mg via ORAL
  Filled 2014-06-29: qty 1

## 2014-06-29 NOTE — ED Provider Notes (Signed)
CSN: [id number]     Arrival date & time 06/29/14  9323 History   First MD Initiated Contact with Patient 06/29/14 [phone]     Chief Complaint  Patient presents with  . Chest Pain     (Consider location/radiation/quality/duration/timing/severity/associated sxs/prior Treatment) HPI Comments: Patient is a 78 year old female with history of hypertension and high cholesterol. She presents today with complaints of tightness in her chest behind her left breast. She says this is been occurring intermittently for the past several months. She was seen by her primary Dr. last week for similar complaints. An EKG was performed and an appointment was made for her to be seen by cardiology. This appointment is not until the 17th. The patient experienced an additional episode of discomfort this morning and presents for evaluation of this. She denies shortness of breath, nausea, diaphoresis, or radiation to her arm or jaw.  Patient is a 78 y.o. female presenting with chest pain. The history is provided by the patient.  Chest Pain Pain location:  L chest Pain quality: tightness   Pain radiates to:  Does not radiate Pain radiates to the back: no   Pain severity:  Moderate Onset quality:  Gradual Duration:  4 months Timing:  Intermittent   Past Medical History  Diagnosis Date  . Hyperlipidemia   . Hypertension   . Macular degeneration     -- Dr. Laurey Arrow (optometrist).  Taking Ocuvite.  . Insomnia   . Hypothyroid    History reviewed. No pertinent past surgical history. Family History  Problem Relation Age of Onset  . Diabetes Brother    History  Substance Use Topics  . Smoking status: Passive Smoke Exposure - Never Smoker  . Smokeless tobacco: Not on file  . Alcohol Use: No   OB History   Grav Para Term Preterm Abortions TAB SAB Ect Mult Living                 Review of Systems  Cardiovascular: Positive for chest pain.  All other systems reviewed and are negative.     Allergies   Lisinopril  Home Medications   Prior to Admission medications   Medication Sig Start Date End Date Taking? Authorizing Provider  aspirin EC 81 MG tablet Take 1 tablet (81 mg total) by mouth daily. 06/28/14   Timmothy Euler, MD  Calcium Carbonate-Vitamin D (CALTRATE 600+D) 600-400 MG-UNIT per tablet Take 2 tablets by mouth daily.      Historical Provider, MD  diphenhydramine-acetaminophen (TYLENOL PM EXTRA STRENGTH) 25-500 MG TABS Take 1 tablet by mouth at bedtime as needed.      Historical Provider, MD  levothyroxine (SYNTHROID, LEVOTHROID) 125 MCG tablet Take 0.5 tablets (62.5 mcg total) by mouth daily. 08/19/13   Timmothy Euler, MD  losartan-hydrochlorothiazide (HYZAAR) 50-12.5 MG per tablet Take 1 tablet by mouth daily. 08/19/13   Timmothy Euler, MD  Multiple Vitamins-Minerals (OCUVITE PRESERVISION) TABS Take 2 tablets by mouth daily.      Historical Provider, MD  pantoprazole (PROTONIX) 40 MG tablet Take 1 tablet (40 mg total) by mouth daily. 06/24/14   Timmothy Euler, MD  simvastatin (ZOCOR) 20 MG tablet TAKE 1 TABLET AT BEDTIME 08/19/13   Timmothy Euler, MD   BP 161/71  Pulse 83  Temp(Src) 97.5 F (36.4 C) (Oral)  Resp 20  Ht 5' 5.5" (1.664 m)  Wt 159 lb (72.122 kg)  BMI 26.05 kg/m2  SpO2 100% Physical Exam  Nursing note and vitals reviewed. Constitutional:  She is oriented to person, place, and time. She appears well-developed and well-nourished. No distress.  HENT:  Head: Normocephalic and atraumatic.  Neck: Normal range of motion. Neck supple.  Cardiovascular: Normal rate and regular rhythm.  Exam reveals no gallop and no friction rub.   No murmur heard. Pulmonary/Chest: Effort normal and breath sounds normal. No respiratory distress. She has no wheezes.  Abdominal: Soft. Bowel sounds are normal. She exhibits no distension. There is no tenderness.  Musculoskeletal: Normal range of motion. She exhibits no edema.  Neurological: She is alert and oriented to person,  place, and time.  Skin: Skin is warm and dry. She is not diaphoretic.    ED Course  Procedures (including critical care time) Labs Review Labs Reviewed  CBC WITH DIFFERENTIAL  BASIC METABOLIC PANEL  I-STAT [place], ED    Imaging Review No results found.   EKG Interpretation   Date/Time:  Tuesday June 29 2014 08:28:09 EDT Ventricular Rate:  84 PR Interval:  154 QRS Duration: 70 QT Interval:  342 QTC Calculation: 404 R Axis:   62 Text Interpretation:  Normal sinus rhythm Low voltage QRS Nonspecific T  wave abnormality Abnormal ECG No significant change since 06/24/14 Confirmed  by DELOS  MD, Vester Titsworth ([id number]) on 06/29/2014 8:38:10 AM      MDM   Final diagnoses:  None    Patient presents with complaints of intermittent chest discomfort for the past several weeks. Although her EKG and cardiac enzymes are negative, she has several risk factors that I feel warrant admission. I've spoken with family medicine who agrees to admit.    Veryl Speak, MD 06/29/14 [phone]

## 2014-06-29 NOTE — H&P (Signed)
FMTS Attending Note  I personally saw and evaluated the patient. The plan of care was discussed with the resident team. I agree with the assessment and plan as documented by the resident.   78 year old Caucasian female with past medical history of hypertension, hyperlipidemia, and hypothyroidism presents with 1- 2 month history of intermittent left-sided chest pain, pain is not related to exertion, no radiation into the jaw or left upper extremity, no associated nausea or shortness of breath, patient does admit to significant anxiety as her husband passed away a few weeks ago, please refer to resident note for additional history of present illness  Vitals: Reviewed General: Pleasant Caucasian female, no acute distress, lying in hospital bed HEENT: Normocephalic, pupils are equal round and reactive to light, extra amounts are intact, no scleral icterus, nasal septum midline, moist mucous members, uvula midline, no pharyngeal erythema or exudate noted, neck was supple, no anterior posterior cervical lymphadenopathy Cardiac: Regular rate and rhythm, S1 and S2 present, no murmurs, no heaves or thrills Respiratory: Clear to auscultation bilaterally, normal effort Abdomen: Soft, nontender, bowel sounds present Extremities: No edema, 2+ radial pulses bilaterally, 2+ dorsalis pedis pulses bilaterally Skin: No rash Neuro: Alert and oriented x3, moving all extremity  Reviewed emergency room imaging, lab work, and EKG  Assessment and plan: 78 year old female admitted with intermittent chest pain 1. Atypical chest pain-additional cardiac workup has been unremarkable, given the long-standing nature of the intermittent chest pain will consult cardiology for possible stress test 2. Hypertension-controlled on home medications 3. Hyperlipidemia-continue home statin, check lipid profile 4. Hypothyroidism-continue home Synthroid, agree with repeat TSH 5. Bereavement due to recent loss of husband - likely  contributing to chest pain, will need to discuss anxiety after cardiac etiology has been ruled out  Dossie Arbour MD

## 2014-06-29 NOTE — ED Notes (Signed)
Pt presents to department for evaluation of L sided chest pain. Ongoing for several months. 4/10 pain upon arrival to ED. Pt states she has appointment with cardiologist, but pain increased last night. Respirations unlabored. Pt is alert and oriented x4.

## 2014-06-29 NOTE — H&P (View-Only) (Signed)
Reason for Consultation: chest pain   HPI:  Kristina Carlson is a 78 y.o. Female with HTN, HL and hypothyroidism whom we are asked to consult on for chest pain.  She denies any h/o known heart disease. She has never had a stress test or cardiac cath. Her husband died last month due to bleeding ulcer.   For th past 2 months she reports intermittent substernal CP radiating to her left shoulder.  Her pain is typically worse at night, but is present through the day, lasting minutes to hours at a time. She denies any associated symptoms. Pain not worse with exertion. However, over past week or two pain has become much more frequent and severe. She has been seen in the [facility] office twice in the past couple of weeks for it. This am woke with severe SSCP so came to ER. In ER, ECG normal and trop negative. CP now relieved. Denies GERD symptoms or any relation to meals. No recent injury.    Review of Systems:     Cardiac Review of Systems: {Y] = yes [ ]  = no  Chest Pain [ y   ]  Resting SOB [   ] Exertional SOB  [  ]  Orthopnea [  ]   Pedal Edema [   ]    Palpitations [  ] Syncope  [  ]   Presyncope [   ]  General Review of Systems: [Y] = yes [  ]=no Constitional: recent weight change [  ]; anorexia [  ]; fatigue [  ]; nausea [  ]; night sweats [  ]; fever [  ]; or chills [  ];                                                                     ] Eyes : blurred vision [  ]; diplopia [   ]; vision changes [  ];  Amaurosis fugax[  ]; Resp: cough [  ];  wheezing[  ];  hemoptysis[  ];  PND [  ];  GI:  gallstones[  ], vomiting[  ];  dysphagia[  ]; melena[  ];  hematochezia [  ]; heartburn[  ];   GU: kidney stones [  ]; hematuria[  ];   dysuria [  ];  nocturia[  ]; incontinence [  ];             Skin: rash, swelling[  ];, hair loss[  ];  peripheral edema[  ];  or itching[  ]; Musculosketetal: myalgias[  ];  joint swelling[  ];  joint erythema[  ];  joint pain[y  ];  back pain[  ];  Heme/Lymph: bruising[  ];   bleeding[  ];  anemia[  ];  Neuro: TIA[  ];  headaches[  ];  stroke[  ];  vertigo[  ];  seizures[  ];   paresthesias[  ];  difficulty walking[  ];  Psych:depression[y  ]; anxiety[  ];  Endocrine: diabetes[  ];  thyroid dysfunction[  ];  Other:  Past Medical History  Diagnosis Date  . Hyperlipidemia   . Hypertension   . Macular degeneration     -- Dr. Laurey Arrow (optometrist).  Taking Ocuvite.  . Insomnia   .  Hypothyroid     Medications Prior to Admission  Medication Sig Dispense Refill  . aspirin EC 81 MG tablet Take 1 tablet (81 mg total) by mouth daily.  30 tablet  11  . Cholecalciferol (VITAMIN D-3) 5000 UNITS TABS Take 5,000 Units by mouth daily.      Marland Kitchen levothyroxine (SYNTHROID, LEVOTHROID) 125 MCG tablet Take 0.5 tablets (62.5 mcg total) by mouth daily.  90 tablet  3  . losartan-hydrochlorothiazide (HYZAAR) 50-12.5 MG per tablet Take 1 tablet by mouth daily.  90 tablet  3  . Multiple Vitamins-Minerals (OCUVITE PRESERVISION) TABS Take 2 tablets by mouth daily.        . pantoprazole (PROTONIX) 40 MG tablet Take 1 tablet (40 mg total) by mouth daily.  30 tablet  1  . simvastatin (ZOCOR) 20 MG tablet Take 20 mg by mouth at bedtime.         Marland Kitchen aspirin EC  81 mg Oral Daily  . heparin  5,000 Units Subcutaneous 3 times per day  . [START ON 06/30/2014] losartan  50 mg Oral Daily   And  . [START ON 06/30/2014] hydrochlorothiazide  12.5 mg Oral Daily  . [START ON 06/30/2014] levothyroxine  62.5 mcg Oral QAC breakfast  . pantoprazole  40 mg Oral Daily  . simvastatin  20 mg Oral QHS    Infusions:    Allergies  Allergen Reactions  . Lisinopril Cough    History   Social History  . Marital Status: Married    Spouse Name: N/A    Number of Children: N/A  . Years of Education: N/A   Occupational History  . Not on file.   Social History Main Topics  . Smoking status: Passive Smoke Exposure - Never Smoker  . Smokeless tobacco: Not on file  . Alcohol Use: No  . Drug Use: No    . Sexual Activity: Not on file   Other Topics Concern  . 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    Has one son.   Has DNR at home.     Family History  Problem Relation Age of Onset  . Diabetes Brother    Thinks dad had heart disease. Mom no heart disease 1 sister and 7 brothers: multiple cancers but no heart disease.  PHYSICAL EXAM: Filed Vitals:   06/29/14 1231  BP: 149/63  Pulse: 68  Temp: 97.8 F (36.6 C)  Resp: 18    No intake or output data in the 24 hours ending 06/29/14 1422  General:  Well appearing. No respiratory difficulty HEENT: normal Neck: supple. no JVD. Carotids 2+ bilat; no bruits. No lymphadenopathy or thryomegaly appreciated. Cor: PMI nondisplaced. Regular rate & rhythm. No rubs, gallops or murmurs. Lungs: clear Abdomen: soft, nontender, nondistended. No hepatosplenomegaly. No bruits or masses. Good bowel sounds. Extremities: no cyanosis, clubbing, rash, edema Neuro: alert & oriented x 3, cranial nerves grossly intact. moves all 4 extremities w/o difficulty. Affect pleasant.  ECG: SR. Low volts. No ST-T wave abnormalities.    Results for orders placed during the hospital encounter of 06/29/14 (from the past 24 hour(s))  CBC WITH DIFFERENTIAL     Status: Abnormal   Collection Time    06/29/14  8:40 AM      Result Value Ref Range   WBC 6.8  4.0 - 10.5 K/uL   RBC 4.29  3.87 - 5.11 MIL/uL   Hemoglobin 13.6  12.0 - 15.0 g/dL  HCT 41.6  36.0 - 46.0 %   MCV 97.0  78.0 - 100.0 fL   MCH 31.7  26.0 - 34.0 pg   MCHC 32.7  30.0 - 36.0 g/dL   RDW 13.1  11.5 - 15.5 %   Platelets 234  150 - 400 K/uL   Neutrophils Relative % 82 (*) 43 - 77 %   Neutro Abs 5.6  1.7 - 7.7 K/uL   Lymphocytes Relative 11 (*) 12 - 46 %   Lymphs Abs 0.7  0.7 - 4.0 K/uL   Monocytes Relative 6  3 - 12 %   Monocytes Absolute 0.4  0.1 - 1.0 K/uL   Eosinophils Relative 1  0 - 5 %   Eosinophils Absolute 0.1  0.0 - 0.7 K/uL   Basophils  Relative 0  0 - 1 %   Basophils Absolute 0.0  0.0 - 0.1 K/uL  BASIC METABOLIC PANEL     Status: Abnormal   Collection Time    06/29/14  8:40 AM      Result Value Ref Range   Sodium 142  137 - 147 mEq/L   Potassium 3.8  3.7 - 5.3 mEq/L   Chloride 100  96 - 112 mEq/L   CO2 27  19 - 32 mEq/L   Glucose, Bld 129 (*) 70 - 99 mg/dL   BUN 11  6 - 23 mg/dL   Creatinine, Ser 0.85  0.50 - 1.10 mg/dL   Calcium 9.6  8.4 - 10.5 mg/dL   GFR calc non Af Amer 65 (*) >90 mL/min   GFR calc Af Amer 76 (*) >90 mL/min   Anion gap 15  5 - 15  I-STAT [place], ED     Status: None   Collection Time    06/29/14  9:11 AM      Result Value Ref Range   Troponin i, poc 0.00  0.00 - 0.08 ng/mL   Comment 3           TROPONIN I     Status: None   Collection Time    06/29/14  1:10 PM      Result Value Ref Range   Troponin I <0.30  <0.30 ng/mL   Dg Chest 2 View  06/29/2014   CLINICAL DATA:  Recent chest pain without known cardiopulmonary abnormality  EXAM: CHEST  2 VIEW  COMPARISON:  None.  FINDINGS: The lungs are mildly hyperinflated but clear. The heart and mediastinal structures are normal. There is gentle levoscoliosis of the lower thoracic spine. There is no pleural effusion.  IMPRESSION: There is mild hyperinflation which is likely voluntary. There is no acute cardiopulmonary abnormality.   Electronically Signed   By: David  Martinique   On: 06/29/2014 09:06     ASSESSMENT: 1. Progressive chest pain concerning for [place] 2. HTN 3. HL  PLAN/DISCUSSION:  Overall her risk for severe underlying CAD is low but progressive nature of CP is quite concerning to me and has caused her to seek medical attention on at least 3 different occasions. I discussed the options of stress testing vs cardiac catheterization (including a 1:500 risk of serious complications including but not limited to: stroke, MI, vascular damage, renal failure, death, etc). They have decided to proceed with cath for a definite answer. We will proceed  today. Would also consider echo.   Daniel Bensimhon,MD 2:35 PM

## 2014-06-29 NOTE — Interval H&P Note (Signed)
Cath Lab Visit (complete for each Cath Lab visit)  Clinical Evaluation Leading to the Procedure:   ACS: Yes.    Non-ACS:    Anginal Classification: CCS IV  Anti-ischemic medical therapy: Minimal Therapy (1 class of medications)  Non-Invasive Test Results: No non-invasive testing performed  Prior CABG: No previous CABG      History and Physical Interval Note:  06/29/2014 3:37 PM  Kristina Carlson  has presented today for surgery, with the diagnosis of cp  The various methods of treatment have been discussed with the patient and family. After consideration of risks, benefits and other options for treatment, the patient has consented to  Procedure(s): LEFT HEART CATHETERIZATION WITH CORONARY ANGIOGRAM (N/A) as a surgical intervention .  The patient's history has been reviewed, patient examined, no change in status, stable for surgery.  I have reviewed the patient's chart and labs.  Questions were answered to the patient's satisfaction.     Sinclair Grooms

## 2014-06-29 NOTE — Consult Note (Signed)
Reason for Consultation: chest pain   HPI:  Kristina Carlson is a 78 y.o. Female with HTN, HL and hypothyroidism whom we are asked to consult on for chest pain.  She denies any h/o known heart disease. She has never had a stress test or cardiac cath. Her husband died last month due to bleeding ulcer.   For th past 2 months she reports intermittent substernal CP radiating to her left shoulder.  Her pain is typically worse at night, but is present through the day, lasting minutes to hours at a time. She denies any associated symptoms. Pain not worse with exertion. However, over past week or two pain has become much more frequent and severe. She has been seen in the [facility] office twice in the past couple of weeks for it. This am woke with severe SSCP so came to ER. In ER, ECG normal and trop negative. CP now relieved. Denies GERD symptoms or any relation to meals. No recent injury.    Review of Systems:     Cardiac Review of Systems: {Y] = yes [ ]  = no  Chest Pain [ y   ]  Resting SOB [   ] Exertional SOB  [  ]  Orthopnea [  ]   Pedal Edema [   ]    Palpitations [  ] Syncope  [  ]   Presyncope [   ]  General Review of Systems: [Y] = yes [  ]=no Constitional: recent weight change [  ]; anorexia [  ]; fatigue [  ]; nausea [  ]; night sweats [  ]; fever [  ]; or chills [  ];                                                                     ] Eyes : blurred vision [  ]; diplopia [   ]; vision changes [  ];  Amaurosis fugax[  ]; Resp: cough [  ];  wheezing[  ];  hemoptysis[  ];  PND [  ];  GI:  gallstones[  ], vomiting[  ];  dysphagia[  ]; melena[  ];  hematochezia [  ]; heartburn[  ];   GU: kidney stones [  ]; hematuria[  ];   dysuria [  ];  nocturia[  ]; incontinence [  ];             Skin: rash, swelling[  ];, hair loss[  ];  peripheral edema[  ];  or itching[  ]; Musculosketetal: myalgias[  ];  joint swelling[  ];  joint erythema[  ];  joint pain[y  ];  back pain[  ];  Heme/Lymph: bruising[  ];   bleeding[  ];  anemia[  ];  Neuro: TIA[  ];  headaches[  ];  stroke[  ];  vertigo[  ];  seizures[  ];   paresthesias[  ];  difficulty walking[  ];  Psych:depression[y  ]; anxiety[  ];  Endocrine: diabetes[  ];  thyroid dysfunction[  ];  Other:  Past Medical History  Diagnosis Date  . Hyperlipidemia   . Hypertension   . Macular degeneration     -- Dr. Laurey Arrow (optometrist).  Taking Ocuvite.  . Insomnia   .  Hypothyroid     Medications Prior to Admission  Medication Sig Dispense Refill  . aspirin EC 81 MG tablet Take 1 tablet (81 mg total) by mouth daily.  30 tablet  11  . Cholecalciferol (VITAMIN D-3) 5000 UNITS TABS Take 5,000 Units by mouth daily.      Marland Kitchen levothyroxine (SYNTHROID, LEVOTHROID) 125 MCG tablet Take 0.5 tablets (62.5 mcg total) by mouth daily.  90 tablet  3  . losartan-hydrochlorothiazide (HYZAAR) 50-12.5 MG per tablet Take 1 tablet by mouth daily.  90 tablet  3  . Multiple Vitamins-Minerals (OCUVITE PRESERVISION) TABS Take 2 tablets by mouth daily.        . pantoprazole (PROTONIX) 40 MG tablet Take 1 tablet (40 mg total) by mouth daily.  30 tablet  1  . simvastatin (ZOCOR) 20 MG tablet Take 20 mg by mouth at bedtime.         Marland Kitchen aspirin EC  81 mg Oral Daily  . heparin  5,000 Units Subcutaneous 3 times per day  . [START ON 06/30/2014] losartan  50 mg Oral Daily   And  . [START ON 06/30/2014] hydrochlorothiazide  12.5 mg Oral Daily  . [START ON 06/30/2014] levothyroxine  62.5 mcg Oral QAC breakfast  . pantoprazole  40 mg Oral Daily  . simvastatin  20 mg Oral QHS    Infusions:    Allergies  Allergen Reactions  . Lisinopril Cough    History   Social History  . Marital Status: Married    Spouse Name: N/A    Number of Children: N/A  . Years of Education: N/A   Occupational History  . Not on file.   Social History Main Topics  . Smoking status: Passive Smoke Exposure - Never Smoker  . Smokeless tobacco: Not on file  . Alcohol Use: No  . Drug Use: No    . Sexual Activity: Not on file   Other Topics Concern  . 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   Has DNR at home.     Family History  Problem Relation Age of Onset  . Diabetes Brother    Thinks dad had heart disease. Mom no heart disease 1 sister and 7 brothers: multiple cancers but no heart disease.  PHYSICAL EXAM: Filed Vitals:   06/29/14 1231  BP: 149/63  Pulse: 68  Temp: 97.8 F (36.6 C)  Resp: 18    No intake or output data in the 24 hours ending 06/29/14 1422  General:  Well appearing. No respiratory difficulty HEENT: normal Neck: supple. no JVD. Carotids 2+ bilat; no bruits. No lymphadenopathy or thryomegaly appreciated. Cor: PMI nondisplaced. Regular rate & rhythm. No rubs, gallops or murmurs. Lungs: clear Abdomen: soft, nontender, nondistended. No hepatosplenomegaly. No bruits or masses. Good bowel sounds. Extremities: no cyanosis, clubbing, rash, edema Neuro: alert & oriented x 3, cranial nerves grossly intact. moves all 4 extremities w/o difficulty. Affect pleasant.  ECG: SR. Low volts. No ST-T wave abnormalities.    Results for orders placed during the hospital encounter of 06/29/14 (from the past 24 hour(s))  CBC WITH DIFFERENTIAL     Status: Abnormal   Collection Time    06/29/14  8:40 AM      Result Value Ref Range   WBC 6.8  4.0 - 10.5 K/uL   RBC 4.29  3.87 - 5.11 MIL/uL   Hemoglobin 13.6  12.0 - 15.0 g/dL  HCT 41.6  36.0 - 46.0 %   MCV 97.0  78.0 - 100.0 fL   MCH 31.7  26.0 - 34.0 pg   MCHC 32.7  30.0 - 36.0 g/dL   RDW 13.1  11.5 - 15.5 %   Platelets 234  150 - 400 K/uL   Neutrophils Relative % 82 (*) 43 - 77 %   Neutro Abs 5.6  1.7 - 7.7 K/uL   Lymphocytes Relative 11 (*) 12 - 46 %   Lymphs Abs 0.7  0.7 - 4.0 K/uL   Monocytes Relative 6  3 - 12 %   Monocytes Absolute 0.4  0.1 - 1.0 K/uL   Eosinophils Relative 1  0 - 5 %   Eosinophils Absolute 0.1  0.0 - 0.7 K/uL   Basophils  Relative 0  0 - 1 %   Basophils Absolute 0.0  0.0 - 0.1 K/uL  BASIC METABOLIC PANEL     Status: Abnormal   Collection Time    06/29/14  8:40 AM      Result Value Ref Range   Sodium 142  137 - 147 mEq/L   Potassium 3.8  3.7 - 5.3 mEq/L   Chloride 100  96 - 112 mEq/L   CO2 27  19 - 32 mEq/L   Glucose, Bld 129 (*) 70 - 99 mg/dL   BUN 11  6 - 23 mg/dL   Creatinine, Ser 0.85  0.50 - 1.10 mg/dL   Calcium 9.6  8.4 - 10.5 mg/dL   GFR calc non Af Amer 65 (*) >90 mL/min   GFR calc Af Amer 76 (*) >90 mL/min   Anion gap 15  5 - 15  I-STAT [place], ED     Status: None   Collection Time    06/29/14  9:11 AM      Result Value Ref Range   Troponin i, poc 0.00  0.00 - 0.08 ng/mL   Comment 3           TROPONIN I     Status: None   Collection Time    06/29/14  1:10 PM      Result Value Ref Range   Troponin I <0.30  <0.30 ng/mL   Dg Chest 2 View  06/29/2014   CLINICAL DATA:  Recent chest pain without known cardiopulmonary abnormality  EXAM: CHEST  2 VIEW  COMPARISON:  None.  FINDINGS: The lungs are mildly hyperinflated but clear. The heart and mediastinal structures are normal. There is gentle levoscoliosis of the lower thoracic spine. There is no pleural effusion.  IMPRESSION: There is mild hyperinflation which is likely voluntary. There is no acute cardiopulmonary abnormality.   Electronically Signed   By: David  Martinique   On: 06/29/2014 09:06     ASSESSMENT: 1. Progressive chest pain concerning for [place] 2. HTN 3. HL  PLAN/DISCUSSION:  Overall her risk for severe underlying CAD is low but progressive nature of CP is quite concerning to me and has caused her to seek medical attention on at least 3 different occasions. I discussed the options of stress testing vs cardiac catheterization (including a 1:500 risk of serious complications including but not limited to: stroke, MI, vascular damage, renal failure, death, etc). They have decided to proceed with cath for a definite answer. We will proceed  today. Would also consider echo.   Jonnette Nuon,MD 2:35 PM

## 2014-06-29 NOTE — H&P (Signed)
[facility] Admission History and Physical Service Pager: [phone]  Patient name: Kristina Carlson Medical record number: [id number] Date of birth: October 17, 1936 Age: 78 y.o. Gender: female  Primary Care Provider: Kenn File, MD Consultants: cardiology (to be called) Code Status: Full  Chief Complaint: left-sided chest pain  Assessment and Plan: Kristina Carlson is a 78 y.o. female presenting with chest pain present for ~2 months but worse in the past 24 hours; questionable angina / exertional component, with risk factors of HTN and HLD. PMH is significant for HTN, HLD, hypothyroidism.  # Chest pain - unclear if truly cardiac in nature but with questionable angina-type symptoms with initial troponin negative and EKG not frankly ischemic (HEART score 4 for H-1,E-1,A-2) - DDx also includes respiratory issues (no CXR findings, cough, coryza, fever / chills, etc), GERD or GI cause (no significant reflux symptoms reported, though taking a PPI recently started), chest wall pain (NO significant tenderness on exam), anxiety (pt appears very calm and not especially distressed but does "want to find out what's wrong") - monitor on telemetry, cycle troponins and repeat EKG in the AM - cardiology consult requested to see if stress or other studies would be recommended; appreciate assistance - considered echocardiogram but no signs / symptoms suggest frank failure or structural abnormality, currently - morphine, Zofran, GI cocktail PRN  #HTN - mild intermittent elevation in the ED, but otherwise appears well-controlled on home meds - continue home losartan / HCTZ, monitor per routine  #HLD - continue home statin - last lipid panel drawn 7/2 so will not repeat  #Hypothyroidism - continue home Synthroid - last TSH within normal range on 7/2  FEN/GI: saline lock IV; NPO with ice chips / sips with meds until cardiology eval, if procedure(s) are recommended today Prophylaxis:  subQ heparin  Disposition: admit to inpatient, telemetry unit, with management as above; attending Dr. Ree Kida  History of Present Illness: Kristina Carlson is a 78 y.o. female presenting with left-sided chest pain. PMH is significant for HTN, HLD, and hypothyroidism. Her pain has been present off and on for about 2 months, but has been worse the past 24 hours. Her pain is described as left / substernal, aching and tight at times, and comes on intermittently; it is unclear if anything in particular brings it on but it does get worse sometimes with movement / exertion and nothing in particular other than "waiting" and rest seems to make it better. Her pain is typically worse at night, but is present through the day, lasting minutes to hours at a time. She has been seen in the office twice in the past couple of weeks; she reports she has a "cardiology appointment" on 7/17 and family requests something sooner (to be done while she is here, if possible); on further chart review, she has an appointment for an echo on 7/17 with previous plans from her PCP Dr. Wendi Snipes to refer to cardiology if the echo is abnormal.  Otherwise she feels reasonably well and reports compliance with her home medications. She denies productive cough, fever / chills, N/V, SOB, orthopnea, leg swelling, abdominal pain.  Review Of Systems: Per HPI. Otherwise 12 point review of systems was performed and was unremarkable.  Patient Active Problem List   Diagnosis Date Noted  . Chest pain 06/29/2014  . Atypical chest pain 06/28/2014  . Hypertension, essential, benign 09/01/2012  . Health maintenance examination 03/11/2012  . Health counseling 09/08/2011  . VITAMIN D DEFICIENCY 08/24/2010  .  HYPERLIPIDEMIA 03/08/2010  . HYPOTHYROIDISM, UNSPECIFIED 02/20/2007  . INSOMNIA NOS 02/20/2007   Past Medical History: Past Medical History  Diagnosis Date  . Hyperlipidemia   . Hypertension   . Macular degeneration     -- Dr. Laurey Arrow  (optometrist).  Taking Ocuvite.  . Insomnia   . Hypothyroid    Past Surgical History: History reviewed. No pertinent past surgical history. Social History: History  Substance Use Topics  . Smoking status: Passive Smoke Exposure - Never Smoker  . Smokeless tobacco: Not on file  . Alcohol Use: No   Please also refer to relevant sections of EMR.  Family History: Family History  Problem Relation Age of Onset  . Diabetes Brother    Allergies and Medications: Allergies  Allergen Reactions  . Lisinopril Cough   No current facility-administered medications on file prior to encounter.   Current Outpatient Prescriptions on File Prior to Encounter  Medication Sig Dispense Refill  . aspirin EC 81 MG tablet Take 1 tablet (81 mg total) by mouth daily.  30 tablet  11  . levothyroxine (SYNTHROID, LEVOTHROID) 125 MCG tablet Take 0.5 tablets (62.5 mcg total) by mouth daily.  90 tablet  3  . losartan-hydrochlorothiazide (HYZAAR) 50-12.5 MG per tablet Take 1 tablet by mouth daily.  90 tablet  3  . Multiple Vitamins-Minerals (OCUVITE PRESERVISION) TABS Take 2 tablets by mouth daily.        . pantoprazole (PROTONIX) 40 MG tablet Take 1 tablet (40 mg total) by mouth daily.  30 tablet  1    Objective: BP 129/57  Pulse 71  Temp(Src) 97.5 F (36.4 C) (Oral)  Resp 13  Ht 5' 5.5" (1.664 m)  Wt 159 lb (72.122 kg)  BMI 26.05 kg/m2  SpO2 95% Exam: General: uncomfortable-appearing elderly adult female in NAD HEENT: [place]/AT, EOMI, PERRLA, MMM, no cervical lymphadenopathy Cardiovascular: RRR, no murmur appreciated  No chest wall pain to significant firm palpation Respiratory: CTAB, no wheezes, normal WOB, no cough; normal sat on room air Abdomen: soft, nontender, BS+ Extremities: warm, well-perfused, no LE edema Skin: warm, dry, intact without frank rashes Neuro: no gross focal deficit, moves all extremities equally; alert / oriented, speech clear and normal  Labs and Imaging: CBC BMET    Recent Labs Lab 06/29/14 0840  WBC 6.8  HGB 13.6  HCT 41.6  PLT 234    Recent Labs Lab 06/29/14 0840  NA 142  K 3.8  CL 100  CO2 27  BUN 11  CREATININE 0.85  GLUCOSE 129*  CALCIUM 9.6     ISTAT troponin in the ED: negative EKG 7/7: Essentially unchanged from 7/2; NSR with nonspecific precordial T-wave flattening in V2, V3  Emmaline Kluver, MD 06/29/2014, 10:37 AM PGY-3, [place] Intern pager: [phone], text pages welcome

## 2014-06-29 NOTE — CV Procedure (Signed)
     Left Heart Catheterization with Coronary Angiography  Report  Kristina Carlson  78 y.o.  female 1936/04/03  Procedure Date: 06/29/2014 Referring Physician: Kenn File, M.D. Primary Cardiologist: Glori Bickers, MD  INDICATIONS: Prolonged chest pain without evidence of myocardial infarction or ischemia on EKG  PROCEDURE: 1. Left heart catheterization; 2. Coronary angiography; 3. Left ventriculography  CONSENT:  The risks, benefits, and details of the procedure were explained in detail to the patient. Risks including death, stroke, heart attack, kidney injury, allergy, limb ischemia, bleeding and radiation injury were discussed.  The patient verbalized understanding and wanted to proceed.  Informed written consent was obtained.  PROCEDURE TECHNIQUE:  After Xylocaine anesthesia a 5 French Slender sheath was placed in the right radial artery with an angiocath and the modified Seldinger technique.  Coronary angiography was done using a 5 F JR 4 and JL 3.5 cm diagnostic catheter.  Left ventriculography was done using the JR 4 diagnostic catheter and hand injection.   Images were reviewed. Noncritical ostial RCA and mid LAD disease was noted.  The case was terminated and hemostasis achieved with a wristband at 12 cc.   CONTRAST:  Total of 100 cc.  COMPLICATIONS:  None   HEMODYNAMICS:  Aortic pressure 104/53 mmHg; LV pressure 115/0 mmHg; LVEDP 5 mmHg  ANGIOGRAPHIC DATA:   The left main coronary artery is normal.  The left anterior descending artery is widely patent except in the LAO cranial view there appears to be eccentric 50% stenosis between the first and second diagonal branch. This is not seen in other views..  The left circumflex artery is gives origin to one obtuse marginal branch and is widely patent with less than 30% ostial narrowing.  The right coronary artery is a dominant vessel. It is widely patent and smooth. There is ostial calcification and 40-50% ostial  narrowing is noted.    LEFT VENTRICULOGRAM:  Left ventricular angiogram was done in the 30 RAO projection and revealed normal LV cavity size with EF of 65-70%.   IMPRESSIONS:  1. Widely patent coronary arteries with 40-50% ostial RCA and up to 50% mid LAD. The ostium of the circumflex contains less than 30% stenosis.  2. Normal left ventricular function   RECOMMENDATION:  The prolonged nature of the patient's chest pain which has been occurring at rest and the wide patency of the coronaries makes myocardial ischemia a very unlikely diagnosis. Should she develop exertional discomfort over time, I would consider a myocardial perfusion study to assess for evidence of right coronary territory ischemia.  Perhaps a GI evaluation would think of the source of the patient's pain.  No further specific cardiac measures are recommended at this time.

## 2014-06-30 DIAGNOSIS — I25119 Atherosclerotic heart disease of native coronary artery with unspecified angina pectoris: Secondary | ICD-10-CM | POA: Diagnosis present

## 2014-06-30 DIAGNOSIS — R079 Chest pain, unspecified: Secondary | ICD-10-CM

## 2014-06-30 DIAGNOSIS — Z634 Disappearance and death of family member: Secondary | ICD-10-CM

## 2014-06-30 DIAGNOSIS — R0789 Other chest pain: Principal | ICD-10-CM

## 2014-06-30 DIAGNOSIS — I25118 Atherosclerotic heart disease of native coronary artery with other forms of angina pectoris: Secondary | ICD-10-CM | POA: Diagnosis present

## 2014-06-30 DIAGNOSIS — I2510 Atherosclerotic heart disease of native coronary artery without angina pectoris: Secondary | ICD-10-CM | POA: Diagnosis present

## 2014-06-30 DIAGNOSIS — E785 Hyperlipidemia, unspecified: Secondary | ICD-10-CM

## 2014-06-30 DIAGNOSIS — E039 Hypothyroidism, unspecified: Secondary | ICD-10-CM

## 2014-06-30 DIAGNOSIS — I10 Essential (primary) hypertension: Secondary | ICD-10-CM

## 2014-06-30 MED ORDER — LORAZEPAM 0.5 MG PO TABS: 0.5000 mg | ORAL_TABLET | Freq: Two times a day (BID) | ORAL | Status: DC | PRN

## 2014-06-30 MED ORDER — LORAZEPAM 0.5 MG PO TABS
0.5000 mg | ORAL_TABLET | Freq: Two times a day (BID) | ORAL | Status: DC | PRN
  Administered 2014-06-30: 0.5 mg via ORAL
  Filled 2014-06-30: qty 1

## 2014-06-30 MED ORDER — ATORVASTATIN CALCIUM 40 MG PO TABS
40.0000 mg | ORAL_TABLET | Freq: Every day | ORAL | Status: DC
Start: 2014-06-30 — End: 2014-07-01

## 2014-06-30 NOTE — Discharge Summary (Signed)
FMTS Attending Note  I personally saw and evaluated the patient. The plan of care was discussed with the resident team. I agree with the assessment and plan as documented by the resident.   Chest pain likely related to recent anxiety/bereavement from Husbands death. Added Ativan BID PRN. Will need to follow up with her PCP at [facility].  Dossie Arbour MD

## 2014-06-30 NOTE — Discharge Summary (Signed)
[facility] Discharge Summary  Patient name: Kristina Carlson Medical record number: [id number] Date of birth: February 13, 1936 Age: 78 y.o. Gender: female Date of Admission: 06/29/2014  Date of Discharge: 06/30/2014 Admitting Physician: Lupita Dawn, MD  Primary Care Provider: Kenn File, MD Consultants: Cardiology  Indication for Hospitalization: Chest pain x 2 months with significant worsening.  Discharge Diagnoses/Problem List:  Chest pain, non-cardiac  HTN Hyperlipidemia  Hypothryroidism  GERD   Disposition: Home  Discharge Condition: Stable   Discharge Exam:  Filed Vitals:   06/30/14 1408  BP: 121/46  Pulse: 63  Temp: 97.4 F (36.3 C)  Resp: 18  General: Elderly female in NAD, lying in bed comfortably  HEENT: [place]/AT, PERRLA, MMM, no cervical lymphadenopathy  Cardiovascular: RRR, no murmurs, rubs, or gallops appreciated  No chest wall pain with palpation  Respiratory: CTAB, no wheezes, crackles, rhonchi, normal WOB, no cough; normal O2 sat on room air  Abdomen: soft, nontender, BS+  Extremities: warm, well-perfused, no LE edema  Skin: warm, dry, intact without frank rashes  Neuro: no gross focal deficit, moves all extremities equally; alert / oriented, speech clear and normal  Brief Hospital Course:  Patient presented with chest pain x 2 months with worsening in the last 24hrs. Her HEART score was calculated to be 4. Initial EKG was NSR with no signs of ischemia/infarction.  Admitted and placed on telemetry with no events overnight.  CXR with no acute process and no respiratory complaints. Given risk factors and patient preference, cardiology performed a cath; ultimately, they did not think that her symptoms were cardiac in nature. Patient has recently began taking a PPI; therefore GERD may have been contributing to her pain. On the day of discharge the patient states that chest pain was stable (as it had been x 2 months) and almost completely  resolved with Tylenol and a PPI which further confirms my suspicion.  Additionally, the patient has been experiencing depression vs bereavement since the loss of her husband. She admits to some anxiety and stress which could be contributing to her symptoms. Provided Ativan PRN.  She was continued on all of her home medications. Given cardiac cath findings her statin was escalated.   Issues for Follow Up:  - If pt begins to have exertional chest pain, consider a Myoview as an outpatient  - Patient expressing some depression vs bereavement. Consider approaching this and starting an SSRI if appropriate   Significant Procedures:  Left Heart Catheterization with Coronary angiography: 1. Widely patent coronary arteries with 40-50% ostial RCA and up to 50% mid LAD. The ostium of the circumflex contains less than 30% stenosis. 2. Normal left ventricular function  Significant Labs and Imaging:   Recent Labs Lab 06/24/14 1030 06/29/14 0840  WBC 3.9* 6.8  HGB 13.8 13.6  HCT 40.6 41.6  PLT 285 234    Recent Labs Lab 06/24/14 1030 06/29/14 0840  NA 141 142  K 4.4 3.8  CL 103 100  CO2 30 27  GLUCOSE 97 129*  BUN 13 11  CREATININE 0.84 0.85  CALCIUM 9.5 9.6  ALKPHOS 73  --   AST 16  --   ALT 18  --   ALBUMIN 4.2  --    Risk Stratification Labs  TSH    Component Value Date/Time   TSH 2.426 06/24/2014 1030   Hemoglobin A1C No results found for this basename: [id number]   Lipid Panel     Component Value Date/Time   CHOL 163  06/24/2014 1030   TRIG 175* 06/24/2014 1030   HDL 38* 06/24/2014 1030   CHOLHDL 4.3 06/24/2014 1030   VLDL 35 06/24/2014 1030   LDLCALC 90 06/24/2014 1030    Troponins neg x3  CXR: There is mild hyperinflation which is likely voluntary. There is no acute cardiopulmonary abnormality.   EKG NSR with a HR 81. No evidence of ST elevation or depression.  Results/Tests Pending at Time of Discharge: No   Discharge Medications:    Medication List    STOP taking these  medications       simvastatin 20 MG tablet  Commonly known as:  ZOCOR      TAKE these medications       aspirin EC 81 MG tablet  Take 1 tablet (81 mg total) by mouth daily.     atorvastatin 40 MG tablet  Commonly known as:  LIPITOR  Take 1 tablet (40 mg total) by mouth daily.     levothyroxine 125 MCG tablet  Commonly known as:  SYNTHROID, LEVOTHROID  Take 0.5 tablets (62.5 mcg total) by mouth daily.     LORazepam 0.5 MG tablet  Commonly known as:  ATIVAN  Take 1 tablet (0.5 mg total) by mouth 2 (two) times daily as needed for anxiety.     losartan-hydrochlorothiazide 50-12.5 MG per tablet  Commonly known as:  HYZAAR  Take 1 tablet by mouth daily.     OCUVITE PRESERVISION Tabs  Take 2 tablets by mouth daily.     pantoprazole 40 MG tablet  Commonly known as:  PROTONIX  Take 1 tablet (40 mg total) by mouth daily.     Vitamin D-3 5000 UNITS Tabs  Take 5,000 Units by mouth daily.        Discharge Instructions: Please refer to Patient Instructions section of EMR for full details.  Patient was counseled important signs and symptoms that should prompt return to medical care, changes in medications, dietary instructions, activity restrictions, and follow up appointments.   Follow-Up Appointments: Follow-up Information   Follow up with Kenn File, MD On 07/15/2014. (at 9:45am for a hospital follow up)    Specialty:  Family Medicine   Contact information:   [place] [address] [phone]       Archie Patten, MD 06/30/2014, 6:33 PM PGY-1, [place]

## 2014-06-30 NOTE — Progress Notes (Signed)
    Subjective:  Some chest earlier- improved with antacid  Objective:  Vital Signs in the last 24 hours: Temp:  [97.5 F (36.4 C)-98 F (36.7 C)] 98 F (36.7 C) (07/08 0546) Pulse Rate:  [64-83] 78 (07/08 0546) Resp:  [10-20] 18 (07/08 0546) BP: (109-161)/(55-71) 118/59 mmHg (07/08 0546) SpO2:  [95 %-100 %] 99 % (07/08 0546) Weight:  [159 lb (72.122 kg)-159 lb 6.4 oz (72.303 kg)] 159 lb 6.3 oz (72.3 kg) (07/08 0546)  Intake/Output from previous day:  Intake/Output Summary (Last 24 hours) at 06/30/14 0634 Last data filed at 06/30/14 0600  Gross per 24 hour  Intake    120 ml  Output      0 ml  Net    120 ml    Physical Exam: General appearance: alert, cooperative and no distress Lungs: clear to auscultation bilaterally Heart: regular rate and rhythm Extremities: Rt wrist without hematoma   Rate: 78  Rhythm: normal sinus rhythm  Lab Results:  Recent Labs  06/29/14 0840  WBC 6.8  HGB 13.6  PLT 234    Recent Labs  06/29/14 0840  NA 142  K 3.8  CL 100  CO2 27  GLUCOSE 129*  BUN 11  CREATININE 0.85    Recent Labs  06/29/14 1827 06/29/14 2041  TROPONINI <0.30 <0.30   No results found for this basename: INR,  in the last 72 hours  Imaging: Imaging results have been reviewed  Cardiac Studies:  Assessment/Plan:  78 y.o. Female with HTN, HL and hypothyroidism whom we were asked to see for chest pain on 06/29/14. Cath done showed non obstructive CAD.    Principal Problem:   Chest pain Active Problems:   CAD- non obstuctive CAD at cath 06/29/14   Hypertension, essential, benign   HYPOTHYROIDISM, UNSPECIFIED   HYPERLIPIDEMIA    PLAN: HTN and dyslipidemia treated. She is now on a PPI. Consider Myoview if pt has exertional chest pain suspicious for angina. We can see prn.   Kerin Ransom PA-C Beeper [phone] 06/30/2014, 6:34 AM   I have seen and examined the patient along with Kerin Ransom PA-C.  I have reviewed the chart, notes and new data.  I  agree with PA's note.  Although she does have moderate coronary atherosclerosis, the lesions are not flow limiting and there is some suggestion that her symptoms may be GI in etiology. Agree with plan for perfusion study (outpatient Myoview)if symptoms recur.  Sanda Klein, MD, [place] [phone] 06/30/2014, 7:31 AM

## 2014-06-30 NOTE — Discharge Instructions (Signed)
You came in with chest pain.  The cardiac catheterization (looking at the vessels in your heart) did not seem to suggest that it was due to heart trouble.  This may have been secondary to acid reflux. I will start you on a medication for this. You have had a lot of stress recently. The chest pain could also be secondary to anxiety. I will prescribe some medication (Ativan) you can take in case of anxiety.  Chest Pain (Nonspecific) It is often hard to give a specific diagnosis for the cause of chest pain. There is always a chance that your pain could be related to something serious, such as a heart attack or a blood clot in the lungs. You need to follow up with your health care provider for further evaluation. CAUSES   Heartburn.  Pneumonia or bronchitis.  Anxiety or stress.  Inflammation around your heart (pericarditis) or lung (pleuritis or pleurisy).  A blood clot in the lung.  A collapsed lung (pneumothorax). It can develop suddenly on its own (spontaneous pneumothorax) or from trauma to the chest.  Shingles infection (herpes zoster virus). The chest wall is composed of bones, muscles, and cartilage. Any of these can be the source of the pain.  The bones can be bruised by injury.  The muscles or cartilage can be strained by coughing or overwork.  The cartilage can be affected by inflammation and become sore (costochondritis). DIAGNOSIS  Lab tests or other studies may be needed to find the cause of your pain. Your health care provider may have you take a test called an ambulatory electrocardiogram (ECG). An ECG records your heartbeat patterns over a 24-hour period. You may also have other tests, such as:  Transthoracic echocardiogram (TTE). During echocardiography, sound waves are used to evaluate how blood flows through your heart.  Transesophageal echocardiogram (TEE).  Cardiac monitoring. This allows your health care provider to monitor your heart rate and rhythm in real  time.  Holter monitor. This is a portable device that records your heartbeat and can help diagnose heart arrhythmias. It allows your health care provider to track your heart activity for several days, if needed.  Stress tests by exercise or by giving medicine that makes the heart beat faster. TREATMENT   Treatment depends on what may be causing your chest pain. Treatment may include:  Acid blockers for heartburn.  Anti-inflammatory medicine.  Pain medicine for inflammatory conditions.  Antibiotics if an infection is present.  You may be advised to change lifestyle habits. This includes stopping smoking and avoiding alcohol, caffeine, and chocolate.  You may be advised to keep your head raised (elevated) when sleeping. This reduces the chance of acid going backward from your stomach into your esophagus. Most of the time, nonspecific chest pain will improve within 2-3 days with rest and mild pain medicine.  HOME CARE INSTRUCTIONS   If antibiotics were prescribed, take them as directed. Finish them even if you start to feel better.  For the next few days, avoid physical activities that bring on chest pain. Continue physical activities as directed.  Do not use any tobacco products, including cigarettes, chewing tobacco, or electronic cigarettes.  Avoid drinking alcohol.  Only take medicine as directed by your health care provider.  Follow your health care provider's suggestions for further testing if your chest pain does not go away.  Keep any follow-up appointments you made. If you do not go to an appointment, you could develop lasting (chronic) problems with pain. If there is  any problem keeping an appointment, call to reschedule. SEEK MEDICAL CARE IF:   Your chest pain does not go away, even after treatment.  You have a rash with blisters on your chest.  You have a fever. SEEK IMMEDIATE MEDICAL CARE IF:   You have increased chest pain or pain that spreads to your arm,  neck, jaw, back, or abdomen.  You have shortness of breath.  You have an increasing cough, or you cough up blood.  You have severe back or abdominal pain.  You feel nauseous or vomit.  You have severe weakness.  You faint.  You have chills. This is an emergency. Do not wait to see if the pain will go away. Get medical help at once. Call your local emergency services (911 in [place] Do not drive yourself to the hospital. MAKE SURE YOU:   Understand these instructions.  Will watch your condition.  Will get help right away if you are not doing well or get worse. Document Released: 09/19/2005 Document Revised: 12/15/2013 Document Reviewed: 07/15/2008 [facility] Patient Information 2015 [place], [place]. This information is not intended to replace advice given to you by your health care provider. Make sure you discuss any questions you have with your health care provider.

## 2014-07-01 ENCOUNTER — Telehealth: Payer: Self-pay | Admitting: Family Medicine

## 2014-07-01 MED ORDER — SIMVASTATIN 20 MG PO TABS: 20.0000 mg | ORAL_TABLET | Freq: Every day | ORAL | Status: DC

## 2014-07-01 NOTE — Telephone Encounter (Signed)
Pt calling asking if she can take zoloft instead of ativan. I agree, we will discuss at next visit. She will dc ativan, it makes her sleepy and she thinks it is too strong.   She would like to go back to her previous statin. I explain that lipitor is indicated but if she prefers she can take simva. Also discussed crestor, she is worried b/c of problems on tv commercials.   Laroy Apple, MD [place] Resident, PGY-3 07/01/2014, 11:52 AM

## 2014-07-01 NOTE — Telephone Encounter (Signed)
Patient recently d/c from hospital. Has concerns about medications she was placed on during her stay. She states that the Ativan she was prescribed is to strong and would like something else. Also, cholesterol meds were changed to Lipitor and patient states she is not thrilled to be on this so she would like an alternatives. Would like to speak to Dr. Wendi Snipes if possible. Please advise.

## 2014-07-02 ENCOUNTER — Telehealth: Payer: Self-pay | Admitting: Family Medicine

## 2014-07-02 DIAGNOSIS — R0789 Other chest pain: Secondary | ICD-10-CM

## 2014-07-02 MED ORDER — ESCITALOPRAM OXALATE 10 MG PO TABS: 10.0000 mg | ORAL_TABLET | Freq: Every day | ORAL | Status: DC

## 2014-07-02 NOTE — Telephone Encounter (Signed)
Pt called because her prescription of Lorazepam is to strong for her. She would like something else called in. She said that she did talk to Dr. Wendi Snipes and he agreed and told her not to take it. She said to call when this is ready for pickup. jw

## 2014-07-02 NOTE — Assessment & Plan Note (Signed)
Possibly anxiety related, pt not wanting to take ativan due to somnolence.  She does feel anxiety is contributing to her chest pain, she would like to start SSRI for anxiety Iknow her well and she is a very reliable f/u, so this is reasoanble.  Start lexapro, she has f/u scheduled in 2 weeks with me.

## 2014-07-02 NOTE — Telephone Encounter (Signed)
Pt calling, wants to start anxiety meds and follow up with me in 2 weeks. Shes well known and reliable, I think its reasonable.   See a/p for details, starting lexapro.   Laroy Apple, MD [place] Resident, PGY-3 07/02/2014, 12:25 PM

## 2014-07-09 ENCOUNTER — Other Ambulatory Visit (HOSPITAL_COMMUNITY): Payer: Medicare Other

## 2014-07-15 ENCOUNTER — Encounter: Payer: Self-pay | Admitting: Family Medicine

## 2014-07-15 ENCOUNTER — Ambulatory Visit (INDEPENDENT_AMBULATORY_CARE_PROVIDER_SITE_OTHER): Payer: Medicare Other | Admitting: Family Medicine

## 2014-07-15 VITALS — BP 161/78 | HR 82 | Temp 98.2°F | Wt 158.0 lb

## 2014-07-15 DIAGNOSIS — I10 Essential (primary) hypertension: Secondary | ICD-10-CM

## 2014-07-15 DIAGNOSIS — F411 Generalized anxiety disorder: Secondary | ICD-10-CM | POA: Insufficient documentation

## 2014-07-15 DIAGNOSIS — R110 Nausea: Secondary | ICD-10-CM

## 2014-07-15 DIAGNOSIS — E785 Hyperlipidemia, unspecified: Secondary | ICD-10-CM

## 2014-07-15 MED ORDER — ONDANSETRON 4 MG PO TBDP: 4.0000 mg | ORAL_TABLET | Freq: Three times a day (TID) | ORAL | Status: DC | PRN

## 2014-07-15 MED ORDER — LOSARTAN POTASSIUM-HCTZ 50-12.5 MG PO TABS
1.0000 | ORAL_TABLET | Freq: Every day | ORAL | Status: DC
Start: 2014-07-15 — End: 2014-11-24

## 2014-07-15 MED ORDER — BUSPIRONE HCL 7.5 MG PO TABS: 7.5000 mg | ORAL_TABLET | Freq: Two times a day (BID) | ORAL | Status: DC

## 2014-07-15 MED ORDER — SIMVASTATIN 40 MG PO TABS: 40.0000 mg | ORAL_TABLET | Freq: Every day | ORAL | Status: DC

## 2014-07-15 NOTE — Assessment & Plan Note (Signed)
Mild, self-limited, intermittent nausea Patient feels this is due to anxiety, that's very likely Zofran when necessary

## 2014-07-15 NOTE — Assessment & Plan Note (Signed)
Patient prefers to continue simvastatin rather than Lipitor Increase simvastatin to 40 mg

## 2014-07-15 NOTE — Patient Instructions (Signed)
Great to see you!  I have sent an Rx for buspar, lest follow up in 2-3 weeks.   Chest Pain (Nonspecific) It is often hard to give a diagnosis for the cause of chest pain. There is always a chance that your pain could be related to something serious, such as a heart attack or a blood clot in the lungs. You need to follow up with your doctor. HOME CARE  If antibiotic medicine was given, take it as directed by your doctor. Finish the medicine even if you start to feel better.  For the next few days, avoid activities that bring on chest pain. Continue physical activities as told by your doctor.  Do not use any tobacco products. This includes cigarettes, chewing tobacco, and e-cigarettes.  Avoid drinking alcohol.  Only take medicine as told by your doctor.  Follow your doctor's suggestions for more testing if your chest pain does not go away.  Keep all doctor visits you made. GET HELP IF:  Your chest pain does not go away, even after treatment.  You have a rash with blisters on your chest.  You have a fever. GET HELP RIGHT AWAY IF:   You have more pain or pain that spreads to your arm, neck, jaw, back, or belly (abdomen).  You have shortness of breath.  You cough more than usual or cough up blood.  You have very bad back or belly pain.  You feel sick to your stomach (nauseous) or throw up (vomit).  You have very bad weakness.  You pass out (faint).  You have chills. This is an emergency. Do not wait to see if the problems will go away. Call your local emergency services (911 in [place] Do not drive yourself to the hospital. MAKE SURE YOU:   Understand these instructions.  Will watch your condition.  Will get help right away if you are not doing well or get worse. Document Released: 05/28/2008 Document Revised: 12/15/2013 Document Reviewed: 05/28/2008 [facility] Patient Information 2015 [place], [place]. This information is not intended to replace advice given to you by  your health care provider. Make sure you discuss any questions you have with your health care provider.

## 2014-07-15 NOTE — Progress Notes (Signed)
Patient ID: Kristina Carlson, female   DOB: January 09, 1936, 78 y.o.   MRN: [id number]  Kenn File, MD Phone: [phone]  Subjective:  Chief complaint-noted  Pt Here for hospitalization, chest pain, anxiety, nausea  Chest pain patient states she still has some left-sided chest pain that radiates to her left shoulder. She's convinced this is most likely due to her anxiety, described as her "nerves". The started after loss of her husband, she tried Lexapro which made her very nauseous. She did not take it again.  She tried alprazolam which was prescribed at the hospital and that made her sleepy so she did not want to take that. She also didn't like it because it was a controlled substance.  She like to try BuSpar and Zoloft today. She denies suicidal ideation  Her GAD 7 score is 8 and not difficult at all  She would like to increase her dose of simvastatin instead of taking Lipitor. She requests a nausea medicine as she's had some intermittent nausea in the morning she attributes to her nerves. She states that she really doesn't like staying alone at night.  ROS-  No dyspnea Chest pain as above No headache No edema   Past Medical History Patient Active Problem List   Diagnosis Date Noted  . Anxiety state, unspecified 07/15/2014  . Nausea alone 07/15/2014  . CAD- non obstuctive CAD at cath 06/29/14 06/30/2014  . Chest pain 06/29/2014  . Intermediate coronary syndrome 06/29/2014  . Atypical chest pain 06/28/2014  . Hypertension, essential, benign 09/01/2012  . Health maintenance examination 03/11/2012  . Health counseling 09/08/2011  . VITAMIN D DEFICIENCY 08/24/2010  . HYPERLIPIDEMIA 03/08/2010  . HYPOTHYROIDISM, UNSPECIFIED 02/20/2007  . INSOMNIA NOS 02/20/2007    Medications- reviewed and updated Current Outpatient Prescriptions  Medication Sig Dispense Refill  . aspirin EC 81 MG tablet Take 1 tablet (81 mg total) by mouth daily.  30 tablet  11  . busPIRone (BUSPAR)  7.5 MG tablet Take 1 tablet (7.5 mg total) by mouth 2 (two) times daily. Then increase to 2 pills twice daily after 1 week  98 tablet  0  . Cholecalciferol (VITAMIN D-3) 5000 UNITS TABS Take 5,000 Units by mouth daily.      Marland Kitchen levothyroxine (SYNTHROID, LEVOTHROID) 125 MCG tablet Take 0.5 tablets (62.5 mcg total) by mouth daily.  90 tablet  3  . losartan-hydrochlorothiazide (HYZAAR) 50-12.5 MG per tablet Take 1 tablet by mouth daily.  90 tablet  3  . Multiple Vitamins-Minerals (OCUVITE PRESERVISION) TABS Take 2 tablets by mouth daily.        . ondansetron (ZOFRAN ODT) 4 MG disintegrating tablet Take 1 tablet (4 mg total) by mouth every 8 (eight) hours as needed for nausea or vomiting.  20 tablet  0  . pantoprazole (PROTONIX) 40 MG tablet Take 1 tablet (40 mg total) by mouth daily.  30 tablet  1  . simvastatin (ZOCOR) 40 MG tablet Take 1 tablet (40 mg total) by mouth at bedtime.  30 tablet  11   No current facility-administered medications for this visit.    Objective: BP 161/78  Pulse 82  Temp(Src) 98.2 F (36.8 C) (Oral)  Wt 158 lb (71.668 kg) Gen: NAD, alert, cooperative with exam HEENT: NCAT Neuro: Alert and oriented, No gross deficits   Assessment/Plan:  Nausea alone Mild, self-limited, intermittent nausea Patient feels this is due to anxiety, that's very likely Zofran when necessary  HYPERLIPIDEMIA Patient prefers to continue simvastatin rather than Lipitor Increase simvastatin to  40 mg  Anxiety state, unspecified Agent did not tolerate Lexapro, she also did not tolerate alprazolam Start BuSpar 7.5 twice a day, increase to 15 twice a day after one week Followup 2-3 weeks     Meds ordered this encounter  Medications  . losartan-hydrochlorothiazide (HYZAAR) 50-12.5 MG per tablet    Sig: Take 1 tablet by mouth daily.    Dispense:  90 tablet    Refill:  3  . busPIRone (BUSPAR) 7.5 MG tablet    Sig: Take 1 tablet (7.5 mg total) by mouth 2 (two) times daily. Then  increase to 2 pills twice daily after 1 week    Dispense:  98 tablet    Refill:  0  . simvastatin (ZOCOR) 40 MG tablet    Sig: Take 1 tablet (40 mg total) by mouth at bedtime.    Dispense:  30 tablet    Refill:  11  . ondansetron (ZOFRAN ODT) 4 MG disintegrating tablet    Sig: Take 1 tablet (4 mg total) by mouth every 8 (eight) hours as needed for nausea or vomiting.    Dispense:  20 tablet    Refill:  0

## 2014-07-15 NOTE — Assessment & Plan Note (Signed)
Agent did not tolerate Lexapro, she also did not tolerate alprazolam Start BuSpar 7.5 twice a day, increase to 15 twice a day after one week Followup 2-3 weeks

## 2014-08-04 ENCOUNTER — Ambulatory Visit (INDEPENDENT_AMBULATORY_CARE_PROVIDER_SITE_OTHER): Payer: Medicare Other | Admitting: Family Medicine

## 2014-08-04 ENCOUNTER — Encounter: Payer: Self-pay | Admitting: Family Medicine

## 2014-08-04 VITALS — BP 139/84 | HR 76 | Temp 97.7°F | Wt 161.0 lb

## 2014-08-04 DIAGNOSIS — I2510 Atherosclerotic heart disease of native coronary artery without angina pectoris: Secondary | ICD-10-CM

## 2014-08-04 DIAGNOSIS — Z0000 Encounter for general adult medical examination without abnormal findings: Secondary | ICD-10-CM

## 2014-08-04 DIAGNOSIS — F411 Generalized anxiety disorder: Secondary | ICD-10-CM

## 2014-08-04 DIAGNOSIS — R0789 Other chest pain: Secondary | ICD-10-CM

## 2014-08-04 MED ORDER — BUSPIRONE HCL 7.5 MG PO TABS: 7.5000 mg | ORAL_TABLET | Freq: Two times a day (BID) | ORAL | Status: DC

## 2014-08-04 NOTE — Assessment & Plan Note (Signed)
Stable, continue ASA, ARB, nd statin

## 2014-08-04 NOTE — Patient Instructions (Signed)
Great to see you!  We'll work on setting up your GI doctor appt.   Lets come back in about a month for this pain if the mammo doesn't show [place] anything.

## 2014-08-04 NOTE — Progress Notes (Signed)
Patient ID: Kristina Carlson, female   DOB: February 26, 1936, 78 y.o.   MRN: [id number]  Kenn File, MD Phone: [phone]  Subjective:  Chief complaint-noted  Pt Here for f/u anxiety and chest pain  Chest pain Intermittent ( 3-4 times per week) sharp annoying pain in L chest with no AA factors that generally lasts for hours . No clear associated factors like dyspnea or fever. Has a mammo scheduled next week.  HAs strong Hx of passive smoke exposure  Anxiety Feeling much better on buspar, did not need to increase dose and nausea resolved. She denies and med side effects  Needs C scope, last jan 2004, like to see [facility] Doesn't really want dexa scan  ROS-  Past Medical History Patient Active Problem List   Diagnosis Date Noted  . Anxiety state, unspecified 07/15/2014  . Nausea alone 07/15/2014  . CAD- non obstuctive CAD at cath 06/29/14 06/30/2014  . Chest pain 06/29/2014  . Intermediate coronary syndrome 06/29/2014  . Atypical chest pain 06/28/2014  . Hypertension, essential, benign 09/01/2012  . Health maintenance examination 03/11/2012  . Health counseling 09/08/2011  . VITAMIN D DEFICIENCY 08/24/2010  . HYPERLIPIDEMIA 03/08/2010  . HYPOTHYROIDISM, UNSPECIFIED 02/20/2007  . INSOMNIA NOS 02/20/2007    Medications- reviewed and updated Current Outpatient Prescriptions  Medication Sig Dispense Refill  . aspirin EC 81 MG tablet Take 1 tablet (81 mg total) by mouth daily.  30 tablet  11  . busPIRone (BUSPAR) 7.5 MG tablet Take 1 tablet (7.5 mg total) by mouth 2 (two) times daily.  60 tablet  11  . Cholecalciferol (VITAMIN D-3) 5000 UNITS TABS Take 5,000 Units by mouth daily.      Marland Kitchen levothyroxine (SYNTHROID, LEVOTHROID) 125 MCG tablet Take 0.5 tablets (62.5 mcg total) by mouth daily.  90 tablet  3  . losartan-hydrochlorothiazide (HYZAAR) 50-12.5 MG per tablet Take 1 tablet by mouth daily.  90 tablet  3  . Multiple Vitamins-Minerals (OCUVITE PRESERVISION) TABS Take 2 tablets by  mouth daily.        . ondansetron (ZOFRAN ODT) 4 MG disintegrating tablet Take 1 tablet (4 mg total) by mouth every 8 (eight) hours as needed for nausea or vomiting.  20 tablet  0  . pantoprazole (PROTONIX) 40 MG tablet Take 1 tablet (40 mg total) by mouth daily.  30 tablet  1  . simvastatin (ZOCOR) 40 MG tablet Take 1 tablet (40 mg total) by mouth at bedtime.  30 tablet  11   No current facility-administered medications for this visit.    Objective: BP 139/84  Pulse 76  Temp(Src) 97.7 F (36.5 C) (Oral)  Wt 161 lb (73.029 kg) Gen: NAD, alert, cooperative with exam HEENT: NCAT CV: RRR, good S1/S2, no murmur Resp: CTABL, no wheezes, non-labored Neuro: Alert and oriented, No gross deficits   Assessment/Plan:  Anxiety state, unspecified Improved with buspar, Rx given for 7.5 mg BID for 1 year followup 3 months  Atypical chest pain Continued, unclear etiology Cardiac work up negative, PPI not helping Has mammo scheduled next week Consider CT chest Follow up 1 month  CAD- non obstuctive CAD at cath 06/29/14 Stable, continue ASA, ARB, nd statin  Health maintenance examination Needs c scope, refer to GI, [facility]- scheduled Never smoker   Orders Placed This Encounter  Procedures  . Ambulatory referral to Gastroenterology    Referral Priority:  Routine    Referral Type:  Consultation    Referral Reason:  Specialty Services Required  Requested Specialty:  Gastroenterology    Number of Visits Requested:  1    Meds ordered this encounter  Medications  . busPIRone (BUSPAR) 7.5 MG tablet    Sig: Take 1 tablet (7.5 mg total) by mouth 2 (two) times daily.    Dispense:  60 tablet    Refill:  11

## 2014-08-04 NOTE — Assessment & Plan Note (Signed)
Continued, unclear etiology Cardiac work up negative, PPI not helping Has mammo scheduled next week Consider CT chest Follow up 1 month

## 2014-08-04 NOTE — Assessment & Plan Note (Signed)
Improved with buspar, Rx given for 7.5 mg BID for 1 year followup 3 months

## 2014-08-04 NOTE — Assessment & Plan Note (Signed)
Needs c scope, refer to GI, [facility]- scheduled Never smoker

## 2014-08-06 ENCOUNTER — Other Ambulatory Visit: Payer: Self-pay

## 2014-08-06 DIAGNOSIS — Z1231 Encounter for screening mammogram for malignant neoplasm of breast: Secondary | ICD-10-CM

## 2014-08-12 ENCOUNTER — Ambulatory Visit: Payer: Medicare Other | Admitting: Internal Medicine

## 2014-08-18 ENCOUNTER — Other Ambulatory Visit: Payer: Self-pay | Admitting: Gastroenterology

## 2014-08-18 DIAGNOSIS — R079 Chest pain, unspecified: Secondary | ICD-10-CM

## 2014-08-20 ENCOUNTER — Ambulatory Visit
Admission: RE | Admit: 2014-08-20 | Discharge: 2014-08-20 | Disposition: A | Discharge status: Home / Self Care | Payer: Medicare Other | Source: Ambulatory Visit | Attending: Gastroenterology | Admitting: Gastroenterology

## 2014-08-20 ENCOUNTER — Ambulatory Visit
Admission: RE | Admit: 2014-08-20 | Discharge: 2014-08-20 | Disposition: A | Discharge status: Home / Self Care | Payer: Medicare Other | Source: Ambulatory Visit

## 2014-08-20 DIAGNOSIS — Z1231 Encounter for screening mammogram for malignant neoplasm of breast: Secondary | ICD-10-CM

## 2014-08-20 DIAGNOSIS — R079 Chest pain, unspecified: Secondary | ICD-10-CM

## 2014-08-23 ENCOUNTER — Other Ambulatory Visit: Payer: Self-pay | Admitting: Family Medicine

## 2014-09-13 ENCOUNTER — Other Ambulatory Visit: Payer: Self-pay | Admitting: Gastroenterology

## 2014-11-01 ENCOUNTER — Other Ambulatory Visit: Payer: Self-pay | Admitting: Family Medicine

## 2014-11-10 ENCOUNTER — Ambulatory Visit (INDEPENDENT_AMBULATORY_CARE_PROVIDER_SITE_OTHER): Payer: Medicare Other | Admitting: Family Medicine

## 2014-11-10 ENCOUNTER — Encounter: Payer: Self-pay | Admitting: Family Medicine

## 2014-11-10 DIAGNOSIS — R0789 Other chest pain: Secondary | ICD-10-CM

## 2014-11-10 MED ORDER — DILTIAZEM HCL ER COATED BEADS 180 MG PO CP24: 180.0000 mg | ORAL_CAPSULE | Freq: Every day | ORAL | Status: DC

## 2014-11-10 NOTE — Progress Notes (Signed)
   Subjective:    Patient ID: Kristina Carlson, female    DOB: 05-19-1936, 78 y.o.   MRN: [id number]  Patient presents for a same day appointment.  HPI  ATYPICAL CHEST PAIN: - Reported for the past 1.5 weeks, central chest constant "burning pain" with radiation across chest L and R (no other radiation, no abdominal pain), pain lasts several hours, worse at night (keeps her up at night). Associated with "head feels funny" some "lightheadness on standing", constant without relief. Denies headaches. - Tried Tylenol without relief. On Protonix since August 2015 without significant relief. Tried liquid antacid medicine similar to Maalox last night while having pain without any relief. - Denies fevers/chills, chest tightness/pressure, DOE, vision changes, numbness, weakness tingling, nausea / vomiting, abdominal pain - Recent history and work-up for same complaint, chronic hx of Left-sided chest and shoulder pain in June 2015, with ED visit in July, admitted due to chest pain and had a Cardiac Catheterization (LHC performed dx non-obstructive CAD, negative). Additionally recent GI work-up barium study (negative), and also referral to GI for Endoscopy and Colonscopy (negative 08/2014, performed at [facility], Endoscopy with normal esophagus w/o ulceration, cannot r/o non-erosive GERD, Colonoscopy with polyp removed, given age no further rec for repeat) - Additionally, recent loss of husband (passed in 05/2014) significant source of anxiety and stress  I have reviewed and updated the following as appropriate: allergies and current medications  Social Hx: - Never smoker, hx 2nd hand smoke  Review of Systems  See above HPI    Objective:   Physical Exam  BP 155/98 mmHg  Pulse 108  Temp(Src) 98 F (36.7 C) (Oral)  Wt 164 lb (74.39 kg)  Gen - well-appearing, pleasant and cooperative, NAD HEENT - PERRL, EOMI, MMM Heart - tachycardic, regular rhythm, no murmurs heard Chest - non-tender to  palpation Lungs - CTAB. Normal work of breathing. Abd - soft, NTND, Murphy's negative, no masses, +active BS Ext - non-tender, no edema, peripheral pulses intact +2 b/l Skin - warm, dry, no rashes Neuro - awake, alert, grossly non-focal, gait normal     Assessment & Plan:   See specific A&P problem list for details.

## 2014-11-10 NOTE — Patient Instructions (Signed)
Dear Kristina Carlson, Thank you for coming in to clinic today.  1. For your chest pain, as we discussed I think that this may be due to a Muscle Spasm (could be in your esophagus or in your heart), often related to stress and anxiety 2. Your other tests are all normal and we do not need to do anything else today. But you may need further work-up in the future. Please discuss this with Dr. Wendi Carlson 3. Prescribed Cardizem (Diltiazem) 180mg  (24 hour release tablet) take once daily for 1 month, recommend to continue for up to 3 months to see if improving pain. This may gradually improve your pain. 4. Also, in the future we may consider a different trial of medicine if this is not helping. Alternatively, you may need your Protonix (GERD medicine) twice daily instead of just once.  Some important numbers from today's visit: BP - 155/98 Results -  Please keep your follow-up appointment with Dr. Wendi Carlson as scheduled on 11/24/14 at 10:30am.  If you have any other questions or concerns, please feel free to call the clinic to contact me. You may also schedule an earlier appointment if necessary.  However, if your symptoms get significantly worse, please go to the Emergency Department to seek immediate medical attention.  Kristina Carlson, [name]

## 2014-11-11 NOTE — Assessment & Plan Note (Addendum)
Persistent atypical chest pain, history suggestive of possible spasm etiology (esophageal vs coronary arteries) given worse at night, likely component of stress/anxiety - Significant recent work-up ruling out ACS with LHC negative (06/2014, non-obstructive CAD), additionally EGD without evidence of GERD, however cannot r/o non-erosive GERD, continues on PPI without significant resolution  Plan: 1. Trial on Diltiazem-SR 180mg  daily (refills up to 3 months) - CCB for smooth muscle relaxation. 2. If ineffective, can consider alternative medication either Amlodipine 5mg  daily vs TCA 3. Also, given report on EGD, perhaps GERD is etiology, and may benefit from BID PPI 4. Follow-up as scheduled with PCP on 11/24/14 5. If worsening follow-up sooner, or go to ED for red flags given

## 2014-11-24 ENCOUNTER — Ambulatory Visit (INDEPENDENT_AMBULATORY_CARE_PROVIDER_SITE_OTHER): Payer: Medicare Other | Admitting: Family Medicine

## 2014-11-24 ENCOUNTER — Encounter: Payer: Self-pay | Admitting: Family Medicine

## 2014-11-24 VITALS — BP 146/79 | HR 68 | Temp 98.2°F | Ht 65.0 in | Wt 167.0 lb

## 2014-11-24 DIAGNOSIS — I10 Essential (primary) hypertension: Secondary | ICD-10-CM

## 2014-11-24 DIAGNOSIS — R079 Chest pain, unspecified: Secondary | ICD-10-CM

## 2014-11-24 DIAGNOSIS — R0789 Other chest pain: Secondary | ICD-10-CM

## 2014-11-24 MED ORDER — LOSARTAN POTASSIUM 50 MG PO TABS: 50.0000 mg | ORAL_TABLET | Freq: Every day | ORAL | Status: DC

## 2014-11-24 NOTE — Assessment & Plan Note (Signed)
Resolved with diltiazem Follow up as needed, continue dilt

## 2014-11-24 NOTE — Assessment & Plan Note (Signed)
Doing well, controlled Some dizziness at night c/w  The static hypotension Orthostatic vital signs in the clinic today are normal  Considering her symptoms I will discontinue the HCTZ Continue losartan and  diltiazem

## 2014-11-24 NOTE — Patient Instructions (Signed)
Great to see you!  I think you are doing very well, lets have you follow up in 1 month to make sure your blood pressure and dizziness are better.

## 2014-11-24 NOTE — Progress Notes (Signed)
Patient ID: Kristina Carlson, female   DOB: 1936/08/10, 78 y.o.   MRN: [id number]   HPI  Patient presents today for  Follow-up chest pain and hypertension  Chest pain Has not recurred since starting diltiazem , currently denying chest pain Also denies dyspnea She does have  2 episodes of waking up gasping for breathing in the last month or so, she denies orthopnea or dyspnea on exertion.   Hypertension  no chest pain, dyspnea, palpitations, leg edema  taking her meds regularly  she does report some dizziness upon standing in the middle the night that happens maybe 2-3 times a week , she denies falls but states that it does make her feel like she may fall sometimes.  Smoking status noted ROS: Per HPI  Objective: BP 146/79 mmHg  Pulse 68  Temp(Src) 98.2 F (36.8 C) (Oral)  Ht 5\' 5"  (1.651 m)  Wt 167 lb (75.751 kg)  BMI 27.79 kg/m2 Gen: NAD, alert, cooperative with exam HEENT: NCAT, EOMI, PERRL CV: RRR, good S1/S2, no murmur Resp: CTABL, no wheezes, non-labored  Ext: No edema, warm Neuro: Alert and oriented, No gross deficits  Assessment and plan:  Atypical chest pain Resolved with diltiazem Follow up as needed, continue dilt  Hypertension, essential, benign Doing well, controlled Some dizziness at night c/w  The static hypotension Orthostatic vital signs in the clinic today are normal  Considering her symptoms I will discontinue the HCTZ Continue losartan and  diltiazem     Meds ordered this encounter  Medications  . losartan (COZAAR) 50 MG tablet    Sig: Take 1 tablet (50 mg total) by mouth daily.    Dispense:  30 tablet    Refill:  11    Please discontinue losartan/HCTZ

## 2014-12-02 ENCOUNTER — Encounter (HOSPITAL_COMMUNITY): Payer: Self-pay | Admitting: Interventional Cardiology

## 2014-12-20 ENCOUNTER — Other Ambulatory Visit: Payer: Self-pay | Admitting: Family Medicine

## 2015-01-12 DIAGNOSIS — M545 Low back pain: Secondary | ICD-10-CM | POA: Diagnosis not present

## 2015-01-19 DIAGNOSIS — M545 Low back pain: Secondary | ICD-10-CM | POA: Diagnosis not present

## 2015-01-24 DIAGNOSIS — M9904 Segmental and somatic dysfunction of sacral region: Secondary | ICD-10-CM | POA: Diagnosis not present

## 2015-01-24 DIAGNOSIS — M5416 Radiculopathy, lumbar region: Secondary | ICD-10-CM | POA: Diagnosis not present

## 2015-01-24 DIAGNOSIS — M5136 Other intervertebral disc degeneration, lumbar region: Secondary | ICD-10-CM | POA: Diagnosis not present

## 2015-01-24 DIAGNOSIS — M9903 Segmental and somatic dysfunction of lumbar region: Secondary | ICD-10-CM | POA: Diagnosis not present

## 2015-01-24 DIAGNOSIS — M545 Low back pain: Secondary | ICD-10-CM | POA: Diagnosis not present

## 2015-01-25 DIAGNOSIS — H35033 Hypertensive retinopathy, bilateral: Secondary | ICD-10-CM | POA: Diagnosis not present

## 2015-01-25 DIAGNOSIS — Z9849 Cataract extraction status, unspecified eye: Secondary | ICD-10-CM | POA: Diagnosis not present

## 2015-01-25 DIAGNOSIS — M9903 Segmental and somatic dysfunction of lumbar region: Secondary | ICD-10-CM | POA: Diagnosis not present

## 2015-01-25 DIAGNOSIS — M5416 Radiculopathy, lumbar region: Secondary | ICD-10-CM | POA: Diagnosis not present

## 2015-01-25 DIAGNOSIS — H3531 Nonexudative age-related macular degeneration: Secondary | ICD-10-CM | POA: Diagnosis not present

## 2015-01-25 DIAGNOSIS — M545 Low back pain: Secondary | ICD-10-CM | POA: Diagnosis not present

## 2015-01-25 DIAGNOSIS — M5136 Other intervertebral disc degeneration, lumbar region: Secondary | ICD-10-CM | POA: Diagnosis not present

## 2015-01-25 DIAGNOSIS — M9904 Segmental and somatic dysfunction of sacral region: Secondary | ICD-10-CM | POA: Diagnosis not present

## 2015-01-26 DIAGNOSIS — M5416 Radiculopathy, lumbar region: Secondary | ICD-10-CM | POA: Diagnosis not present

## 2015-01-26 DIAGNOSIS — M545 Low back pain: Secondary | ICD-10-CM | POA: Diagnosis not present

## 2015-01-26 DIAGNOSIS — M5136 Other intervertebral disc degeneration, lumbar region: Secondary | ICD-10-CM | POA: Diagnosis not present

## 2015-01-26 DIAGNOSIS — M9904 Segmental and somatic dysfunction of sacral region: Secondary | ICD-10-CM | POA: Diagnosis not present

## 2015-01-26 DIAGNOSIS — M9903 Segmental and somatic dysfunction of lumbar region: Secondary | ICD-10-CM | POA: Diagnosis not present

## 2015-01-28 DIAGNOSIS — M545 Low back pain: Secondary | ICD-10-CM | POA: Diagnosis not present

## 2015-01-28 DIAGNOSIS — M5416 Radiculopathy, lumbar region: Secondary | ICD-10-CM | POA: Diagnosis not present

## 2015-01-28 DIAGNOSIS — M9903 Segmental and somatic dysfunction of lumbar region: Secondary | ICD-10-CM | POA: Diagnosis not present

## 2015-01-28 DIAGNOSIS — M5136 Other intervertebral disc degeneration, lumbar region: Secondary | ICD-10-CM | POA: Diagnosis not present

## 2015-01-28 DIAGNOSIS — M9904 Segmental and somatic dysfunction of sacral region: Secondary | ICD-10-CM | POA: Diagnosis not present

## 2015-01-29 ENCOUNTER — Other Ambulatory Visit: Payer: Self-pay | Admitting: Family Medicine

## 2015-01-31 ENCOUNTER — Other Ambulatory Visit: Payer: Self-pay | Admitting: Family Medicine

## 2015-01-31 DIAGNOSIS — M9904 Segmental and somatic dysfunction of sacral region: Secondary | ICD-10-CM | POA: Diagnosis not present

## 2015-01-31 DIAGNOSIS — M545 Low back pain: Secondary | ICD-10-CM | POA: Diagnosis not present

## 2015-01-31 DIAGNOSIS — M5416 Radiculopathy, lumbar region: Secondary | ICD-10-CM | POA: Diagnosis not present

## 2015-01-31 DIAGNOSIS — M9903 Segmental and somatic dysfunction of lumbar region: Secondary | ICD-10-CM | POA: Diagnosis not present

## 2015-01-31 DIAGNOSIS — M5136 Other intervertebral disc degeneration, lumbar region: Secondary | ICD-10-CM | POA: Diagnosis not present

## 2015-02-02 DIAGNOSIS — M545 Low back pain: Secondary | ICD-10-CM | POA: Diagnosis not present

## 2015-02-02 DIAGNOSIS — M9903 Segmental and somatic dysfunction of lumbar region: Secondary | ICD-10-CM | POA: Diagnosis not present

## 2015-02-02 DIAGNOSIS — M5416 Radiculopathy, lumbar region: Secondary | ICD-10-CM | POA: Diagnosis not present

## 2015-02-02 DIAGNOSIS — M9904 Segmental and somatic dysfunction of sacral region: Secondary | ICD-10-CM | POA: Diagnosis not present

## 2015-02-02 DIAGNOSIS — M5136 Other intervertebral disc degeneration, lumbar region: Secondary | ICD-10-CM | POA: Diagnosis not present

## 2015-02-03 DIAGNOSIS — M5136 Other intervertebral disc degeneration, lumbar region: Secondary | ICD-10-CM | POA: Diagnosis not present

## 2015-02-03 DIAGNOSIS — M9903 Segmental and somatic dysfunction of lumbar region: Secondary | ICD-10-CM | POA: Diagnosis not present

## 2015-02-03 DIAGNOSIS — M9904 Segmental and somatic dysfunction of sacral region: Secondary | ICD-10-CM | POA: Diagnosis not present

## 2015-02-03 DIAGNOSIS — M545 Low back pain: Secondary | ICD-10-CM | POA: Diagnosis not present

## 2015-02-03 DIAGNOSIS — M5416 Radiculopathy, lumbar region: Secondary | ICD-10-CM | POA: Diagnosis not present

## 2015-02-09 DIAGNOSIS — M5136 Other intervertebral disc degeneration, lumbar region: Secondary | ICD-10-CM | POA: Diagnosis not present

## 2015-02-09 DIAGNOSIS — M9903 Segmental and somatic dysfunction of lumbar region: Secondary | ICD-10-CM | POA: Diagnosis not present

## 2015-02-09 DIAGNOSIS — M5416 Radiculopathy, lumbar region: Secondary | ICD-10-CM | POA: Diagnosis not present

## 2015-02-09 DIAGNOSIS — M545 Low back pain: Secondary | ICD-10-CM | POA: Diagnosis not present

## 2015-02-09 DIAGNOSIS — M9904 Segmental and somatic dysfunction of sacral region: Secondary | ICD-10-CM | POA: Diagnosis not present

## 2015-02-10 DIAGNOSIS — M9903 Segmental and somatic dysfunction of lumbar region: Secondary | ICD-10-CM | POA: Diagnosis not present

## 2015-02-10 DIAGNOSIS — M5416 Radiculopathy, lumbar region: Secondary | ICD-10-CM | POA: Diagnosis not present

## 2015-02-10 DIAGNOSIS — M5136 Other intervertebral disc degeneration, lumbar region: Secondary | ICD-10-CM | POA: Diagnosis not present

## 2015-02-10 DIAGNOSIS — M545 Low back pain: Secondary | ICD-10-CM | POA: Diagnosis not present

## 2015-02-10 DIAGNOSIS — M9904 Segmental and somatic dysfunction of sacral region: Secondary | ICD-10-CM | POA: Diagnosis not present

## 2015-02-14 DIAGNOSIS — M9904 Segmental and somatic dysfunction of sacral region: Secondary | ICD-10-CM | POA: Diagnosis not present

## 2015-02-14 DIAGNOSIS — M545 Low back pain: Secondary | ICD-10-CM | POA: Diagnosis not present

## 2015-02-14 DIAGNOSIS — M5416 Radiculopathy, lumbar region: Secondary | ICD-10-CM | POA: Diagnosis not present

## 2015-02-14 DIAGNOSIS — M9903 Segmental and somatic dysfunction of lumbar region: Secondary | ICD-10-CM | POA: Diagnosis not present

## 2015-02-14 DIAGNOSIS — M5136 Other intervertebral disc degeneration, lumbar region: Secondary | ICD-10-CM | POA: Diagnosis not present

## 2015-02-17 DIAGNOSIS — M545 Low back pain: Secondary | ICD-10-CM | POA: Diagnosis not present

## 2015-02-17 DIAGNOSIS — M5416 Radiculopathy, lumbar region: Secondary | ICD-10-CM | POA: Diagnosis not present

## 2015-02-17 DIAGNOSIS — M9903 Segmental and somatic dysfunction of lumbar region: Secondary | ICD-10-CM | POA: Diagnosis not present

## 2015-02-17 DIAGNOSIS — M9904 Segmental and somatic dysfunction of sacral region: Secondary | ICD-10-CM | POA: Diagnosis not present

## 2015-02-17 DIAGNOSIS — M5136 Other intervertebral disc degeneration, lumbar region: Secondary | ICD-10-CM | POA: Diagnosis not present

## 2015-02-21 DIAGNOSIS — M5136 Other intervertebral disc degeneration, lumbar region: Secondary | ICD-10-CM | POA: Diagnosis not present

## 2015-02-21 DIAGNOSIS — M5416 Radiculopathy, lumbar region: Secondary | ICD-10-CM | POA: Diagnosis not present

## 2015-02-21 DIAGNOSIS — M9903 Segmental and somatic dysfunction of lumbar region: Secondary | ICD-10-CM | POA: Diagnosis not present

## 2015-02-21 DIAGNOSIS — M9904 Segmental and somatic dysfunction of sacral region: Secondary | ICD-10-CM | POA: Diagnosis not present

## 2015-02-21 DIAGNOSIS — M545 Low back pain: Secondary | ICD-10-CM | POA: Diagnosis not present

## 2015-02-23 DIAGNOSIS — M9903 Segmental and somatic dysfunction of lumbar region: Secondary | ICD-10-CM | POA: Diagnosis not present

## 2015-02-23 DIAGNOSIS — M545 Low back pain: Secondary | ICD-10-CM | POA: Diagnosis not present

## 2015-02-23 DIAGNOSIS — M5136 Other intervertebral disc degeneration, lumbar region: Secondary | ICD-10-CM | POA: Diagnosis not present

## 2015-02-23 DIAGNOSIS — M5416 Radiculopathy, lumbar region: Secondary | ICD-10-CM | POA: Diagnosis not present

## 2015-02-23 DIAGNOSIS — M9904 Segmental and somatic dysfunction of sacral region: Secondary | ICD-10-CM | POA: Diagnosis not present

## 2015-02-24 DIAGNOSIS — M5416 Radiculopathy, lumbar region: Secondary | ICD-10-CM | POA: Diagnosis not present

## 2015-02-24 DIAGNOSIS — M9903 Segmental and somatic dysfunction of lumbar region: Secondary | ICD-10-CM | POA: Diagnosis not present

## 2015-02-24 DIAGNOSIS — M545 Low back pain: Secondary | ICD-10-CM | POA: Diagnosis not present

## 2015-02-24 DIAGNOSIS — M9904 Segmental and somatic dysfunction of sacral region: Secondary | ICD-10-CM | POA: Diagnosis not present

## 2015-02-24 DIAGNOSIS — M5136 Other intervertebral disc degeneration, lumbar region: Secondary | ICD-10-CM | POA: Diagnosis not present

## 2015-02-28 DIAGNOSIS — M5136 Other intervertebral disc degeneration, lumbar region: Secondary | ICD-10-CM | POA: Diagnosis not present

## 2015-02-28 DIAGNOSIS — M5416 Radiculopathy, lumbar region: Secondary | ICD-10-CM | POA: Diagnosis not present

## 2015-02-28 DIAGNOSIS — M9904 Segmental and somatic dysfunction of sacral region: Secondary | ICD-10-CM | POA: Diagnosis not present

## 2015-02-28 DIAGNOSIS — M545 Low back pain: Secondary | ICD-10-CM | POA: Diagnosis not present

## 2015-02-28 DIAGNOSIS — M9903 Segmental and somatic dysfunction of lumbar region: Secondary | ICD-10-CM | POA: Diagnosis not present

## 2015-03-03 DIAGNOSIS — M5416 Radiculopathy, lumbar region: Secondary | ICD-10-CM | POA: Diagnosis not present

## 2015-03-03 DIAGNOSIS — M9903 Segmental and somatic dysfunction of lumbar region: Secondary | ICD-10-CM | POA: Diagnosis not present

## 2015-03-03 DIAGNOSIS — M545 Low back pain: Secondary | ICD-10-CM | POA: Diagnosis not present

## 2015-03-03 DIAGNOSIS — M9904 Segmental and somatic dysfunction of sacral region: Secondary | ICD-10-CM | POA: Diagnosis not present

## 2015-03-03 DIAGNOSIS — M5136 Other intervertebral disc degeneration, lumbar region: Secondary | ICD-10-CM | POA: Diagnosis not present

## 2015-03-07 DIAGNOSIS — M5136 Other intervertebral disc degeneration, lumbar region: Secondary | ICD-10-CM | POA: Diagnosis not present

## 2015-03-07 DIAGNOSIS — M9904 Segmental and somatic dysfunction of sacral region: Secondary | ICD-10-CM | POA: Diagnosis not present

## 2015-03-07 DIAGNOSIS — M545 Low back pain: Secondary | ICD-10-CM | POA: Diagnosis not present

## 2015-03-07 DIAGNOSIS — M5416 Radiculopathy, lumbar region: Secondary | ICD-10-CM | POA: Diagnosis not present

## 2015-03-07 DIAGNOSIS — M9903 Segmental and somatic dysfunction of lumbar region: Secondary | ICD-10-CM | POA: Diagnosis not present

## 2015-03-10 DIAGNOSIS — M545 Low back pain: Secondary | ICD-10-CM | POA: Diagnosis not present

## 2015-03-10 DIAGNOSIS — M9904 Segmental and somatic dysfunction of sacral region: Secondary | ICD-10-CM | POA: Diagnosis not present

## 2015-03-10 DIAGNOSIS — M9903 Segmental and somatic dysfunction of lumbar region: Secondary | ICD-10-CM | POA: Diagnosis not present

## 2015-03-10 DIAGNOSIS — M5416 Radiculopathy, lumbar region: Secondary | ICD-10-CM | POA: Diagnosis not present

## 2015-03-10 DIAGNOSIS — M5136 Other intervertebral disc degeneration, lumbar region: Secondary | ICD-10-CM | POA: Diagnosis not present

## 2015-03-14 DIAGNOSIS — M5136 Other intervertebral disc degeneration, lumbar region: Secondary | ICD-10-CM | POA: Diagnosis not present

## 2015-03-14 DIAGNOSIS — M9903 Segmental and somatic dysfunction of lumbar region: Secondary | ICD-10-CM | POA: Diagnosis not present

## 2015-03-14 DIAGNOSIS — M5416 Radiculopathy, lumbar region: Secondary | ICD-10-CM | POA: Diagnosis not present

## 2015-03-14 DIAGNOSIS — M9904 Segmental and somatic dysfunction of sacral region: Secondary | ICD-10-CM | POA: Diagnosis not present

## 2015-03-14 DIAGNOSIS — M545 Low back pain: Secondary | ICD-10-CM | POA: Diagnosis not present

## 2015-03-17 DIAGNOSIS — M5416 Radiculopathy, lumbar region: Secondary | ICD-10-CM | POA: Diagnosis not present

## 2015-03-17 DIAGNOSIS — M9903 Segmental and somatic dysfunction of lumbar region: Secondary | ICD-10-CM | POA: Diagnosis not present

## 2015-03-17 DIAGNOSIS — M545 Low back pain: Secondary | ICD-10-CM | POA: Diagnosis not present

## 2015-03-17 DIAGNOSIS — M5136 Other intervertebral disc degeneration, lumbar region: Secondary | ICD-10-CM | POA: Diagnosis not present

## 2015-03-17 DIAGNOSIS — M9904 Segmental and somatic dysfunction of sacral region: Secondary | ICD-10-CM | POA: Diagnosis not present

## 2015-03-21 DIAGNOSIS — M5136 Other intervertebral disc degeneration, lumbar region: Secondary | ICD-10-CM | POA: Diagnosis not present

## 2015-03-21 DIAGNOSIS — M5416 Radiculopathy, lumbar region: Secondary | ICD-10-CM | POA: Diagnosis not present

## 2015-03-21 DIAGNOSIS — M9903 Segmental and somatic dysfunction of lumbar region: Secondary | ICD-10-CM | POA: Diagnosis not present

## 2015-03-21 DIAGNOSIS — M545 Low back pain: Secondary | ICD-10-CM | POA: Diagnosis not present

## 2015-03-21 DIAGNOSIS — M9904 Segmental and somatic dysfunction of sacral region: Secondary | ICD-10-CM | POA: Diagnosis not present

## 2015-03-24 DIAGNOSIS — M9903 Segmental and somatic dysfunction of lumbar region: Secondary | ICD-10-CM | POA: Diagnosis not present

## 2015-03-24 DIAGNOSIS — M5136 Other intervertebral disc degeneration, lumbar region: Secondary | ICD-10-CM | POA: Diagnosis not present

## 2015-03-24 DIAGNOSIS — M5416 Radiculopathy, lumbar region: Secondary | ICD-10-CM | POA: Diagnosis not present

## 2015-03-24 DIAGNOSIS — M545 Low back pain: Secondary | ICD-10-CM | POA: Diagnosis not present

## 2015-03-24 DIAGNOSIS — M9904 Segmental and somatic dysfunction of sacral region: Secondary | ICD-10-CM | POA: Diagnosis not present

## 2015-03-28 DIAGNOSIS — M5136 Other intervertebral disc degeneration, lumbar region: Secondary | ICD-10-CM | POA: Diagnosis not present

## 2015-03-28 DIAGNOSIS — M545 Low back pain: Secondary | ICD-10-CM | POA: Diagnosis not present

## 2015-03-28 DIAGNOSIS — M9903 Segmental and somatic dysfunction of lumbar region: Secondary | ICD-10-CM | POA: Diagnosis not present

## 2015-03-28 DIAGNOSIS — M9904 Segmental and somatic dysfunction of sacral region: Secondary | ICD-10-CM | POA: Diagnosis not present

## 2015-03-28 DIAGNOSIS — M5416 Radiculopathy, lumbar region: Secondary | ICD-10-CM | POA: Diagnosis not present

## 2015-03-31 DIAGNOSIS — M9903 Segmental and somatic dysfunction of lumbar region: Secondary | ICD-10-CM | POA: Diagnosis not present

## 2015-03-31 DIAGNOSIS — M5136 Other intervertebral disc degeneration, lumbar region: Secondary | ICD-10-CM | POA: Diagnosis not present

## 2015-03-31 DIAGNOSIS — M9904 Segmental and somatic dysfunction of sacral region: Secondary | ICD-10-CM | POA: Diagnosis not present

## 2015-03-31 DIAGNOSIS — M5416 Radiculopathy, lumbar region: Secondary | ICD-10-CM | POA: Diagnosis not present

## 2015-03-31 DIAGNOSIS — M545 Low back pain: Secondary | ICD-10-CM | POA: Diagnosis not present

## 2015-04-04 DIAGNOSIS — M5416 Radiculopathy, lumbar region: Secondary | ICD-10-CM | POA: Diagnosis not present

## 2015-04-04 DIAGNOSIS — M9903 Segmental and somatic dysfunction of lumbar region: Secondary | ICD-10-CM | POA: Diagnosis not present

## 2015-04-04 DIAGNOSIS — M5136 Other intervertebral disc degeneration, lumbar region: Secondary | ICD-10-CM | POA: Diagnosis not present

## 2015-04-04 DIAGNOSIS — M9904 Segmental and somatic dysfunction of sacral region: Secondary | ICD-10-CM | POA: Diagnosis not present

## 2015-04-04 DIAGNOSIS — M545 Low back pain: Secondary | ICD-10-CM | POA: Diagnosis not present

## 2015-04-07 DIAGNOSIS — M5416 Radiculopathy, lumbar region: Secondary | ICD-10-CM | POA: Diagnosis not present

## 2015-04-07 DIAGNOSIS — M545 Low back pain: Secondary | ICD-10-CM | POA: Diagnosis not present

## 2015-04-07 DIAGNOSIS — M9904 Segmental and somatic dysfunction of sacral region: Secondary | ICD-10-CM | POA: Diagnosis not present

## 2015-04-07 DIAGNOSIS — M9903 Segmental and somatic dysfunction of lumbar region: Secondary | ICD-10-CM | POA: Diagnosis not present

## 2015-04-07 DIAGNOSIS — M5136 Other intervertebral disc degeneration, lumbar region: Secondary | ICD-10-CM | POA: Diagnosis not present

## 2015-04-11 ENCOUNTER — Other Ambulatory Visit: Payer: Self-pay | Admitting: Family Medicine

## 2015-04-11 DIAGNOSIS — M5416 Radiculopathy, lumbar region: Secondary | ICD-10-CM | POA: Diagnosis not present

## 2015-04-11 DIAGNOSIS — M9903 Segmental and somatic dysfunction of lumbar region: Secondary | ICD-10-CM | POA: Diagnosis not present

## 2015-04-11 DIAGNOSIS — M545 Low back pain: Secondary | ICD-10-CM | POA: Diagnosis not present

## 2015-04-11 DIAGNOSIS — M9904 Segmental and somatic dysfunction of sacral region: Secondary | ICD-10-CM | POA: Diagnosis not present

## 2015-04-11 DIAGNOSIS — M5136 Other intervertebral disc degeneration, lumbar region: Secondary | ICD-10-CM | POA: Diagnosis not present

## 2015-04-18 DIAGNOSIS — M5136 Other intervertebral disc degeneration, lumbar region: Secondary | ICD-10-CM | POA: Diagnosis not present

## 2015-04-18 DIAGNOSIS — M9903 Segmental and somatic dysfunction of lumbar region: Secondary | ICD-10-CM | POA: Diagnosis not present

## 2015-04-18 DIAGNOSIS — M5416 Radiculopathy, lumbar region: Secondary | ICD-10-CM | POA: Diagnosis not present

## 2015-04-18 DIAGNOSIS — M9904 Segmental and somatic dysfunction of sacral region: Secondary | ICD-10-CM | POA: Diagnosis not present

## 2015-04-18 DIAGNOSIS — M545 Low back pain: Secondary | ICD-10-CM | POA: Diagnosis not present

## 2015-04-25 DIAGNOSIS — M545 Low back pain: Secondary | ICD-10-CM | POA: Diagnosis not present

## 2015-04-25 DIAGNOSIS — M9904 Segmental and somatic dysfunction of sacral region: Secondary | ICD-10-CM | POA: Diagnosis not present

## 2015-04-25 DIAGNOSIS — M5136 Other intervertebral disc degeneration, lumbar region: Secondary | ICD-10-CM | POA: Diagnosis not present

## 2015-04-25 DIAGNOSIS — M5416 Radiculopathy, lumbar region: Secondary | ICD-10-CM | POA: Diagnosis not present

## 2015-04-25 DIAGNOSIS — M9903 Segmental and somatic dysfunction of lumbar region: Secondary | ICD-10-CM | POA: Diagnosis not present

## 2015-05-02 DIAGNOSIS — M545 Low back pain: Secondary | ICD-10-CM | POA: Diagnosis not present

## 2015-05-02 DIAGNOSIS — M5416 Radiculopathy, lumbar region: Secondary | ICD-10-CM | POA: Diagnosis not present

## 2015-05-02 DIAGNOSIS — M9904 Segmental and somatic dysfunction of sacral region: Secondary | ICD-10-CM | POA: Diagnosis not present

## 2015-05-02 DIAGNOSIS — M5136 Other intervertebral disc degeneration, lumbar region: Secondary | ICD-10-CM | POA: Diagnosis not present

## 2015-05-02 DIAGNOSIS — M9903 Segmental and somatic dysfunction of lumbar region: Secondary | ICD-10-CM | POA: Diagnosis not present

## 2015-05-09 DIAGNOSIS — M545 Low back pain: Secondary | ICD-10-CM | POA: Diagnosis not present

## 2015-05-09 DIAGNOSIS — M9903 Segmental and somatic dysfunction of lumbar region: Secondary | ICD-10-CM | POA: Diagnosis not present

## 2015-05-09 DIAGNOSIS — M5136 Other intervertebral disc degeneration, lumbar region: Secondary | ICD-10-CM | POA: Diagnosis not present

## 2015-05-09 DIAGNOSIS — M9904 Segmental and somatic dysfunction of sacral region: Secondary | ICD-10-CM | POA: Diagnosis not present

## 2015-05-09 DIAGNOSIS — M5416 Radiculopathy, lumbar region: Secondary | ICD-10-CM | POA: Diagnosis not present

## 2015-05-12 DIAGNOSIS — H02054 Trichiasis without entropian left upper eyelid: Secondary | ICD-10-CM | POA: Diagnosis not present

## 2015-06-06 ENCOUNTER — Other Ambulatory Visit: Payer: Self-pay | Admitting: Family Medicine

## 2015-06-22 ENCOUNTER — Encounter: Payer: Self-pay | Admitting: Family Medicine

## 2015-06-22 ENCOUNTER — Ambulatory Visit (INDEPENDENT_AMBULATORY_CARE_PROVIDER_SITE_OTHER): Payer: Medicare Other | Admitting: Family Medicine

## 2015-06-22 VITALS — BP 149/63 | HR 81 | Temp 98.0°F | Ht 65.0 in | Wt 167.9 lb

## 2015-06-22 DIAGNOSIS — I2510 Atherosclerotic heart disease of native coronary artery without angina pectoris: Secondary | ICD-10-CM

## 2015-06-22 DIAGNOSIS — E039 Hypothyroidism, unspecified: Secondary | ICD-10-CM

## 2015-06-22 DIAGNOSIS — I10 Essential (primary) hypertension: Secondary | ICD-10-CM

## 2015-06-22 DIAGNOSIS — Z23 Encounter for immunization: Secondary | ICD-10-CM

## 2015-06-22 DIAGNOSIS — E2839 Other primary ovarian failure: Secondary | ICD-10-CM | POA: Diagnosis not present

## 2015-06-22 DIAGNOSIS — E785 Hyperlipidemia, unspecified: Secondary | ICD-10-CM

## 2015-06-22 DIAGNOSIS — Z0000 Encounter for general adult medical examination without abnormal findings: Secondary | ICD-10-CM

## 2015-06-22 DIAGNOSIS — E559 Vitamin D deficiency, unspecified: Secondary | ICD-10-CM

## 2015-06-22 DIAGNOSIS — F411 Generalized anxiety disorder: Secondary | ICD-10-CM | POA: Diagnosis not present

## 2015-06-22 DIAGNOSIS — R0601 Orthopnea: Secondary | ICD-10-CM | POA: Insufficient documentation

## 2015-06-22 MED ORDER — SIMVASTATIN 40 MG PO TABS: 40.0000 mg | ORAL_TABLET | Freq: Every day | ORAL | Status: DC

## 2015-06-22 MED ORDER — LORAZEPAM 0.5 MG PO TABS: 0.5000 mg | ORAL_TABLET | Freq: Two times a day (BID) | ORAL | Status: DC | PRN

## 2015-06-22 MED ORDER — BUSPIRONE HCL 7.5 MG PO TABS: 7.5000 mg | ORAL_TABLET | Freq: Two times a day (BID) | ORAL | Status: DC

## 2015-06-22 MED ORDER — ASPIRIN 81 MG PO TBEC: 81.0000 mg | DELAYED_RELEASE_TABLET | Freq: Every day | ORAL | Status: DC

## 2015-06-22 MED ORDER — ZOSTER VACCINE LIVE 19400 UNT/0.65ML ~~LOC~~ SOLR: 0.6500 mL | Freq: Once | SUBCUTANEOUS | Status: DC

## 2015-06-22 MED ORDER — PANTOPRAZOLE SODIUM 40 MG PO TBEC: 40.0000 mg | DELAYED_RELEASE_TABLET | Freq: Every day | ORAL | Status: DC

## 2015-06-22 NOTE — Assessment & Plan Note (Signed)
Controlled Continue ARB, had cough with ace Also on diltiazem for concern of vasospasm, she would like to trial off so will dc for now

## 2015-06-22 NOTE — Assessment & Plan Note (Signed)
zostavax Rx given Prevnar today Dexa ordered as well

## 2015-06-22 NOTE — Assessment & Plan Note (Signed)
Check TSH On long term stable dose

## 2015-06-22 NOTE — Assessment & Plan Note (Signed)
No chest pain, does have orthopnea  Has symptoms similar to PND which may be anxiety related Considering CAD and symptoms will check TTE

## 2015-06-22 NOTE — Progress Notes (Signed)
Patient ID: Kristina Carlson, female   DOB: [birth year], 79 y.o.   MRN: [id number]   HPI  Patient presents today for  Follow-up  Anxiety Feels that she is doing well overall. She is using BuSpar twice daily without problems. She feels BuSpar is helping a lot  she describes episodes of heavy breathing and panic in the middle the night  That wakes her up out of sleep,  He states this is An approximately 4 times since her husband died about a year ago.   CAD   she had a episode of chest pain in 2000 15 that oriented admission for ACS rule out. She had  Cath then that showed widely patent coronary arteries with 40-50% stenosis of the RCA and 50% of her mid LAD , she had normal function at that time.  she denies chest pain now has not had any episodes since her episode that warranted her starting diltiazem.    hypertension Not checking blood pressure No chest pain,  Palpitations, headache, or leg edema Dyspnea as above  taking meds regularly  PMH: Smoking status noted ROS: Per HPI  Objective: BP 149/63 mmHg  Pulse 81  Temp(Src) 98 F (36.7 C) (Oral)  Ht 5\' 5"  (1.651 m)  Wt 167 lb 14.4 oz (76.159 kg)  BMI 27.94 kg/m2 Gen: NAD, alert, cooperative with exam HEENT: NCAT CV: RRR, good S1/S2, no murmur Resp: CTABL, no wheezes, non-labored Ext: No edema, warm Neuro: Alert and oriented, No gross deficits  Assessment and plan:  Health maintenance examination zostavax Rx given Prevnar today Dexa ordered as well  CAD- non obstuctive CAD at cath 06/29/14 No chest pain, does have orthopnea  Has symptoms similar to PND which may be anxiety related Considering CAD and symptoms will check TTE  Anxiety state Doing well overall buspar helping, is having episodes of dyspnea at night she feels is anxiety Sparingly use ativan, discussed with her Considering CAD, r/o CHF  HLD (hyperlipidemia) Fasting Labs, Continue tolerating statin, indicated given CAD   Hypertension, essential,  benign Controlled Continue ARB, had cough with ace Also on diltiazem for concern of vasospasm, she would like to trial off so will dc for now  Hypothyroidism Check TSH On long term stable dose   Orders Placed This Encounter  Procedures  . DG Bone Density    Standing Status: Future     Number of Occurrences:      Standing Expiration Date: 08/21/2016    Order Specific Question:  Reason for Exam (SYMPTOM  OR DIAGNOSIS REQUIRED)    Answer:  osteoporosis screen    Order Specific Question:  Preferred imaging location?    Answer:  [facility]  . Pneumococcal conjugate vaccine 13-valent  . CBC with Differential    Standing Status: Future     Number of Occurrences:      Standing Expiration Date: 06/21/2016  . Comprehensive metabolic panel    Standing Status: Future     Number of Occurrences:      Standing Expiration Date: 06/21/2016  . TSH    Standing Status: Future     Number of Occurrences:      Standing Expiration Date: 06/21/2016  . Lipid panel    Standing Status: Future     Number of Occurrences:      Standing Expiration Date: 06/21/2016  . Echocardiogram    Standing Status: Future     Number of Occurrences:      Standing Expiration Date: 09/21/2016    Order  Specific Question:  Where should this test be performed    Answer:  [facility])    Order Specific Question:  Complete or Limited study?    Answer:  Complete    Order Specific Question:  With Image Enhancing Agent or without Image Enhancing Agent?    Answer:  With Image Enhancing Agent    Order Specific Question:  Reason for exam-Echo    Answer:  Dyspnea  786.09 / R06.00    Meds ordered this encounter  Medications  . aspirin 81 MG EC tablet    Sig: Take 1 tablet (81 mg total) by mouth daily. Swallow whole.    Dispense:  90 tablet    Refill:  3  . simvastatin (ZOCOR) 40 MG tablet    Sig: Take 1 tablet (40 mg total) by mouth at bedtime.    Dispense:  90 tablet    Refill:  3  .  pantoprazole (PROTONIX) 40 MG tablet    Sig: Take 1 tablet (40 mg total) by mouth daily.    Dispense:  90 tablet    Refill:  3  . busPIRone (BUSPAR) 7.5 MG tablet    Sig: Take 1 tablet (7.5 mg total) by mouth 2 (two) times daily.    Dispense:  180 tablet    Refill:  3  . LORazepam (ATIVAN) 0.5 MG tablet    Sig: Take 1 tablet (0.5 mg total) by mouth 2 (two) times daily as needed for anxiety.    Dispense:  15 tablet    Refill:  0  . zoster vaccine live, PF, (ZOSTAVAX) 70350 UNT/0.65ML injection    Sig: Inject 19,400 Units into the skin once.    Dispense:  1 each    Refill:  0

## 2015-06-22 NOTE — Assessment & Plan Note (Signed)
Fasting Labs, Continue tolerating statin, indicated given CAD

## 2015-06-22 NOTE — Assessment & Plan Note (Signed)
Doing well overall buspar helping, is having episodes of dyspnea at night she feels is anxiety Sparingly use ativan, discussed with her Considering CAD, r/o CHF

## 2015-06-22 NOTE — Patient Instructions (Signed)
Great to see you!  Make a lab appointment after July 7th for fasting labs  Come back to follow up in 6 months  You should hear within a week about your labs, if not call and ask about them.

## 2015-06-23 ENCOUNTER — Ambulatory Visit (HOSPITAL_COMMUNITY): Admission: RE | Admit: 2015-06-23 | Payer: Medicare Other | Source: Ambulatory Visit

## 2015-06-24 ENCOUNTER — Telehealth: Payer: Self-pay | Admitting: *Deleted

## 2015-06-24 NOTE — Telephone Encounter (Signed)
Pt informed of dexa scan. Monday July 11th @ 1:10pm. Delray Alt, CMA

## 2015-07-04 ENCOUNTER — Other Ambulatory Visit: Payer: Self-pay

## 2015-07-04 ENCOUNTER — Other Ambulatory Visit: Payer: Medicare Other

## 2015-07-04 ENCOUNTER — Ambulatory Visit
Admission: RE | Admit: 2015-07-04 | Discharge: 2015-07-04 | Disposition: A | Discharge status: Home / Self Care | Payer: Medicare Other | Source: Ambulatory Visit | Attending: Family Medicine | Admitting: Family Medicine

## 2015-07-04 DIAGNOSIS — Z780 Asymptomatic menopausal state: Secondary | ICD-10-CM | POA: Diagnosis not present

## 2015-07-04 DIAGNOSIS — I10 Essential (primary) hypertension: Secondary | ICD-10-CM

## 2015-07-04 DIAGNOSIS — Z1231 Encounter for screening mammogram for malignant neoplasm of breast: Secondary | ICD-10-CM

## 2015-07-04 DIAGNOSIS — E039 Hypothyroidism, unspecified: Secondary | ICD-10-CM | POA: Diagnosis not present

## 2015-07-04 DIAGNOSIS — I2510 Atherosclerotic heart disease of native coronary artery without angina pectoris: Secondary | ICD-10-CM | POA: Diagnosis not present

## 2015-07-04 DIAGNOSIS — E2839 Other primary ovarian failure: Secondary | ICD-10-CM

## 2015-07-04 DIAGNOSIS — M85852 Other specified disorders of bone density and structure, left thigh: Secondary | ICD-10-CM | POA: Diagnosis not present

## 2015-07-04 LAB — CBC WITH DIFFERENTIAL/PLATELET
BASOS ABS: 0 10*3/uL (ref 0.0–0.1)
Basophils Relative: 1 % (ref 0–1)
EOS ABS: 0.1 10*3/uL (ref 0.0–0.7)
EOS PCT: 2 % (ref 0–5)
HCT: 41.8 % (ref 36.0–46.0)
HEMOGLOBIN: 13.7 g/dL (ref 12.0–15.0)
LYMPHS ABS: 1.1 10*3/uL (ref 0.7–4.0)
LYMPHS PCT: 26 % (ref 12–46)
MCH: 31.9 pg (ref 26.0–34.0)
MCHC: 32.8 g/dL (ref 30.0–36.0)
MCV: 97.2 fL (ref 78.0–100.0)
MONOS PCT: 6 % (ref 3–12)
MPV: 10.8 fL (ref 8.6–12.4)
Monocytes Absolute: 0.2 10*3/uL (ref 0.1–1.0)
NEUTROS PCT: 65 % (ref 43–77)
Neutro Abs: 2.7 10*3/uL (ref 1.7–7.7)
Platelets: 247 10*3/uL (ref 150–400)
RBC: 4.3 MIL/uL (ref 3.87–5.11)
RDW: 13.4 % (ref 11.5–15.5)
WBC: 4.1 10*3/uL (ref 4.0–10.5)

## 2015-07-04 LAB — COMPREHENSIVE METABOLIC PANEL
ALK PHOS: 72 U/L (ref 39–117)
ALT: 16 U/L (ref 0–35)
AST: 16 U/L (ref 0–37)
Albumin: 4 g/dL (ref 3.5–5.2)
BILIRUBIN TOTAL: 0.4 mg/dL (ref 0.2–1.2)
BUN: 16 mg/dL (ref 6–23)
CO2: 27 meq/L (ref 19–32)
Calcium: 9.4 mg/dL (ref 8.4–10.5)
Chloride: 105 mEq/L (ref 96–112)
Creat: 0.95 mg/dL (ref 0.50–1.10)
Glucose, Bld: 92 mg/dL (ref 70–99)
POTASSIUM: 4.5 meq/L (ref 3.5–5.3)
Sodium: 145 mEq/L (ref 135–145)
TOTAL PROTEIN: 6.5 g/dL (ref 6.0–8.3)

## 2015-07-04 LAB — LIPID PANEL
CHOL/HDL RATIO: 4 ratio
Cholesterol: 123 mg/dL (ref 0–200)
HDL: 31 mg/dL — ABNORMAL LOW (ref >=46)
LDL CALC: 66 mg/dL (ref 0–99)
Triglycerides: 129 mg/dL (ref <150)
VLDL: 26 mg/dL (ref 0–40)

## 2015-07-04 LAB — TSH: TSH: 5.776 u[IU]/mL — ABNORMAL HIGH (ref 0.350–4.500)

## 2015-07-04 NOTE — Progress Notes (Signed)
CBC WITH DIFF, CMP, TSH AND FLP DONE TODAY Kristina Carlson

## 2015-07-25 ENCOUNTER — Other Ambulatory Visit: Payer: Self-pay | Admitting: *Deleted

## 2015-07-25 DIAGNOSIS — H3531 Nonexudative age-related macular degeneration: Secondary | ICD-10-CM | POA: Diagnosis not present

## 2015-07-25 DIAGNOSIS — E039 Hypothyroidism, unspecified: Secondary | ICD-10-CM

## 2015-07-25 MED ORDER — LEVOTHYROXINE SODIUM 125 MCG PO TABS: 62.5000 ug | ORAL_TABLET | Freq: Every day | ORAL | Status: DC

## 2015-07-29 ENCOUNTER — Telehealth: Payer: Self-pay | Admitting: Family Medicine

## 2015-07-29 NOTE — Telephone Encounter (Signed)
Reviewed chart. Patient was last seen 06/22/15 for physical and had multiple labs completed and DEXA Scan. Called patient back and review labs, as she is now my PCP, but I have not established with her. See discussion below:  1. CMET - unremarkable, normal SCr, LFTs. Fasting glucose 93. Patient reports family history of diabetes, reassurance provided.  2. CBC - unremarkable. No anemia.  3. Fasting Lipid panel - Mild elevated TGs 120. Low HDL 33. Normal LDL 66. Well controlled on Simvastatin. No change at this time.  4. TSH - Elevated at 5.776 (previously normal 2.426 on 06/2014). Long chronic history of hypothyroidism on chronic levothyroxine supplement currently taking 62.66mcg (half tab of 125). Patient reports overall doing well without any obvious concerns or symptoms regarding her thyroid. Seems compliant with daily 62.37mcg daily, no other changes. I advised her that most likely we would need to slightly increase her Levothyroxine in future, but requested that she follow-up at [facility] to establish and discuss this in more detail.  5. DEXA Scan - 07/04/15 - Osteopenia, L-femur T-score: -2.2 (borderline almost at osteoporosis range), other T-scores were 0.5 and normal. Increased fracture risk. Patient admits to prior DEXA scan years ago, cannot recall results. Not in chart. No prior dx with osteoporosis. She is not taking supplemental Ca. Recommend starting 800-1200mg  daily Ca.  6. H/o vitamin D deficiency - Last checked 2011 at 52. Not checked with recent labs. Currently admits to taking Vitamin D supplement OTC 5,000iu daily. I advised her that 2,000iu daily would be appropriate.  Recommend follow-up with me (new PCP) to establish within 1-3 months. Increase Levothyroxine to 52mcg daily. Consider bisophosphonate therapy.  [name], [place], PGY-3

## 2015-07-29 NOTE — Telephone Encounter (Signed)
Need results for lab and bone density.  Been a month since these were done.  Please call asap.

## 2015-08-17 ENCOUNTER — Other Ambulatory Visit: Payer: Self-pay | Admitting: *Deleted

## 2015-08-17 DIAGNOSIS — I10 Essential (primary) hypertension: Secondary | ICD-10-CM

## 2015-08-18 DIAGNOSIS — H02054 Trichiasis without entropian left upper eyelid: Secondary | ICD-10-CM | POA: Diagnosis not present

## 2015-08-18 MED ORDER — LOSARTAN POTASSIUM 50 MG PO TABS: 50.0000 mg | ORAL_TABLET | Freq: Every day | ORAL | Status: DC

## 2015-08-22 ENCOUNTER — Ambulatory Visit
Admission: RE | Admit: 2015-08-22 | Discharge: 2015-08-22 | Disposition: A | Discharge status: Home / Self Care | Payer: Medicare Other | Source: Ambulatory Visit

## 2015-08-22 DIAGNOSIS — Z1231 Encounter for screening mammogram for malignant neoplasm of breast: Secondary | ICD-10-CM

## 2015-11-04 ENCOUNTER — Ambulatory Visit (INDEPENDENT_AMBULATORY_CARE_PROVIDER_SITE_OTHER): Payer: Medicare Other | Admitting: Family Medicine

## 2015-11-04 ENCOUNTER — Encounter: Payer: Self-pay | Admitting: Family Medicine

## 2015-11-04 VITALS — BP 146/66 | HR 84 | Temp 97.8°F | Ht 65.0 in | Wt 171.0 lb

## 2015-11-04 DIAGNOSIS — E039 Hypothyroidism, unspecified: Secondary | ICD-10-CM | POA: Diagnosis not present

## 2015-11-04 DIAGNOSIS — I10 Essential (primary) hypertension: Secondary | ICD-10-CM

## 2015-11-04 DIAGNOSIS — G4700 Insomnia, unspecified: Secondary | ICD-10-CM

## 2015-11-04 DIAGNOSIS — E559 Vitamin D deficiency, unspecified: Secondary | ICD-10-CM | POA: Diagnosis not present

## 2015-11-04 MED ORDER — LOSARTAN POTASSIUM 50 MG PO TABS: 50.0000 mg | ORAL_TABLET | Freq: Every day | ORAL | Status: DC

## 2015-11-04 MED ORDER — LEVOTHYROXINE SODIUM 75 MCG PO TABS: 75.0000 ug | ORAL_TABLET | Freq: Every day | ORAL | Status: DC

## 2015-11-04 NOTE — Progress Notes (Signed)
Subjective:    Patient ID: Kristina Carlson, female    DOB: [birth year], 79 y.o.   MRN: [id number]  Kristina Carlson is a 79 y.o. female presenting on 11/04/2015 for change thyroid medicine   HPI  HYPOTHYROIDISM: Last visit with PCP on 06/22/15. I had spoken to her on telephone regarding prior lab results on 07/29/2015, including CMET, CBC, fasting lipids. Specifically emphasized that her TSH was mildly elevated at 5.776 (from prior 2.426 in 06/2014), with longstanding history of hypothyroidism on chronic levothyroxine, taking 62.64mg daily (half of the 1214mtab), had advised her at that time we would increase her dose.  H/o vitamin D deficiency Last checked 2011 at 2868Currently admits to taking Vitamin D supplement OTC 5,000iu daily. I had recently advised her in 07/2015 to reduce to 2,000iu daily  INSOMNIA, with chronic anxiety: Additionally reports a chronic concern of occasional insomnia and panic attacks that wake her up at night. Symptoms seem to have started over past 1 year following her husbands passing in 05/2014. She was on Zoloft prior, now taking Buspar 7.73m42mID. Also has rx for Ativan 0.73mg40mN anxiety, has tried this for sleep before, but would prefer not to take this and has not taken it in a while. - Currently she has tried Tylenol PM with some relief - Reports current sleep history with usually going to bed at approx 2200 most nights, seems to wake up regularly q 2 hr, has discontinued all caffeine, now drinks decaf coffee, last fluids usually around 1830, no late night snacks, usually reads before bed and does not watch TV, no significant regular daily exercise  Past Medical History  Diagnosis Date  . Hyperlipidemia   . Hypertension   . Macular degeneration     -- Dr. JudiLaurey Arrowtometrist).  Taking Ocuvite.  . Insomnia   . Hypothyroid     Social History   Social History  . Marital Status: Married    Spouse Name: N/A  . Number of Children: N/A  . Years of  Education: N/A   Occupational History  . Not on file.   Social History Main Topics  . Smoking status: Passive Smoke Exposure - Never Smoker  . Smokeless tobacco: Not on file  . Alcohol Use: No  . Drug Use: No  . Sexual Activity: Not on file   Other Topics Concern  . 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   Has DNR at home.     Current Outpatient Prescriptions on File Prior to Visit  Medication Sig  . aspirin 81 MG EC tablet Take 1 tablet (81 mg total) by mouth daily. Swallow whole.  . busPIRone (BUSPAR) 7.5 MG tablet Take 1 tablet (7.5 mg total) by mouth 2 (two) times daily.  . Cholecalciferol (VITAMIN D-3) 5000 UNITS TABS Take 5,000 Units by mouth daily.  . [place] Kitchenazepam (ATIVAN) 0.5 MG tablet Take 1 tablet (0.5 mg total) by mouth 2 (two) times daily as needed for anxiety.  . Multiple Vitamins-Minerals (OCUVITE PRESERVISION) TABS Take 2 tablets by mouth daily.    . pantoprazole (PROTONIX) 40 MG tablet Take 1 tablet (40 mg total) by mouth daily.  . simvastatin (ZOCOR) 40 MG tablet Take 1 tablet (40 mg total) by mouth at bedtime.  . zoster vaccine live, PF, (ZOSTAVAX) 194025427/0.65ML injection Inject 19,400 Units into the skin once.   No current facility-administered medications on file prior  to visit.    Review of Systems  Constitutional: Negative for fever, chills, diaphoresis, activity change, appetite change and fatigue.  Respiratory: Negative for cough and shortness of breath.   Cardiovascular: Negative for chest pain, palpitations and leg swelling.  Gastrointestinal: Negative for nausea, vomiting, abdominal pain, diarrhea and constipation.  Endocrine: Negative for cold intolerance.  Musculoskeletal: Negative for arthralgias.  Skin: Negative for rash.  Neurological: Negative for dizziness, weakness, light-headedness, numbness and headaches.   Per HPI unless specifically indicated above     Objective:    BP 146/66  mmHg  Pulse 84  Temp(Src) 97.8 F (36.6 C) (Oral)  Ht 5' 5" (1.651 m)  Wt 171 lb (77.565 kg)  BMI 28.46 kg/m2  Wt Readings from Last 3 Encounters:  11/04/15 171 lb (77.565 kg)  06/22/15 167 lb 14.4 oz (76.159 kg)  11/24/14 167 lb (75.751 kg)    Physical Exam  Constitutional: She is oriented to person, place, and time. She appears well-developed and well-nourished. No distress.  HENT:  Mouth/Throat: Oropharynx is clear and moist.  Neck: Normal range of motion. Neck supple. No thyromegaly present.  Cardiovascular: Normal rate, regular rhythm, normal heart sounds and intact distal pulses.   No murmur heard. Pulmonary/Chest: Effort normal and breath sounds normal. No respiratory distress. She has no wheezes. She has no rales.  Abdominal: Soft. Bowel sounds are normal.  Musculoskeletal: She exhibits no edema.  Lymphadenopathy:    She has no cervical adenopathy.  Neurological: She is alert and oriented to person, place, and time.  Skin: Skin is warm and dry. No rash noted. She is not diaphoretic.  Nursing note and vitals reviewed.  Results for orders placed or performed in visit on 07/04/15  CBC with Differential  Result Value Ref Range   WBC 4.1 4.0 - 10.5 K/uL   RBC 4.30 3.87 - 5.11 MIL/uL   Hemoglobin 13.7 12.0 - 15.0 g/dL   HCT 41.8 36.0 - 46.0 %   MCV 97.2 78.0 - 100.0 fL   MCH 31.9 26.0 - 34.0 pg   MCHC 32.8 30.0 - 36.0 g/dL   RDW 13.4 11.5 - 15.5 %   Platelets 247 150 - 400 K/uL   MPV 10.8 8.6 - 12.4 fL   Neutrophils Relative % 65 43 - 77 %   Neutro Abs 2.7 1.7 - 7.7 K/uL   Lymphocytes Relative 26 12 - 46 %   Lymphs Abs 1.1 0.7 - 4.0 K/uL   Monocytes Relative 6 3 - 12 %   Monocytes Absolute 0.2 0.1 - 1.0 K/uL   Eosinophils Relative 2 0 - 5 %   Eosinophils Absolute 0.1 0.0 - 0.7 K/uL   Basophils Relative 1 0 - 1 %   Basophils Absolute 0.0 0.0 - 0.1 K/uL   Smear Review Criteria for review not met   Comprehensive metabolic panel  Result Value Ref Range   Sodium 145  135 - 145 mEq/L   Potassium 4.5 3.5 - 5.3 mEq/L   Chloride 105 96 - 112 mEq/L   CO2 27 19 - 32 mEq/L   Glucose, Bld 92 70 - 99 mg/dL   BUN 16 6 - 23 mg/dL   Creat 0.95 0.50 - 1.10 mg/dL   Total Bilirubin 0.4 0.2 - 1.2 mg/dL   Alkaline Phosphatase 72 39 - 117 U/L   AST 16 0 - 37 U/L   ALT 16 0 - 35 U/L   Total Protein 6.5 6.0 - 8.3 g/dL   Albumin 4.0 3.5 -  5.2 g/dL   Calcium 9.4 8.4 - 10.5 mg/dL  TSH  Result Value Ref Range   TSH 5.776 (H) 0.350 - 4.500 uIU/mL  Lipid panel  Result Value Ref Range   Cholesterol 123 0 - 200 mg/dL   Triglycerides 129 <150 mg/dL   HDL 31 (L) >=46 mg/dL   Total CHOL/HDL Ratio 4.0 Ratio   VLDL 26 0 - 40 mg/dL   LDL Cholesterol 66 0 - 99 mg/dL      Assessment & Plan:   Problem List Items Addressed This Visit      Cardiovascular and Mediastinum   Hypertension, essential, benign    Controlled HTN, BP today mildly elevated at SBP 140s, within goal of < 150/90  Meds - previously on losartan 65m (not on active list today), self discontinued Diltiazem (was on prior for CP, vasospasm) No complications   Plan:  1. Re-check BP at next visit, re-evaluate anti-HTN meds 2. Last BMET SCr normal 3. Lifestyle Mods - Goal to improve exercise, Dec salt intake, inc K+ rich vegs 4. Monitor BP at home or at drug store occasionally.       Relevant Medications   losartan (COZAAR) 50 MG tablet     Endocrine   Hypothyroidism - Primary    Controlled, asymptomatic. Last TSH 5.776 (06/2015)  Plan: 1. Titrate up levothyroxine from 62.580m daily to 7527mdaily, sent new rx 2. Advised to schedule future Lab Only visit in 6 weeks - placed order for TSH      Relevant Medications   levothyroxine (SYNTHROID, LEVOTHROID) 75 MCG tablet   Other Relevant Orders   TSH     Other   INSOMNIA NOS    Suspected secondary to chronic anxiety and some potential depression related to husband passing 1 year ago. - Seems to have very good sleep hygiene by report. Difficulty  with sleep latency less problem with onset. - On Buspar for anxiety. Has ativan PRN sleep but does not like to take. Tries tylenol PM with some relief. - reported PHQ9 score of 0 to nursing  Plan: 1. Recommend to improve sleep hygiene, specifically start regular daily exercise during day 2. Can use Tylenol PM PRN 3. Plan to further explore insomnia, anxiety, depression at next visit, may benefit for returning to SSRI      Vitamin D deficiency    Prior h/o Vit D deficiency, last checked at [phone] [id number]), prior on supplement up to 5k iu daily - Osteopenia with last DEXA 06/2015  Plan: 1. Recently advised her to continue Vitamin D 2,000iu daily maintenance 2. Ordered future Vitamin D to check in 6 weeks with TSH      Relevant Orders   VITAMIN D 25 Hydroxy (Vit-D Deficiency, Fractures)    Other Visit Diagnoses    Essential hypertension        Relevant Medications    losartan (COZAAR) 50 MG tablet       Meds ordered this encounter  Medications  . levothyroxine (SYNTHROID, LEVOTHROID) 75 MCG tablet    Sig: Take 1 tablet (75 mcg total) by mouth daily.    Dispense:  30 tablet    Refill:  2  . losartan (COZAAR) 50 MG tablet    Sig: Take 1 tablet (50 mg total) by mouth daily.    Dispense:  90 tablet    Refill:  3      Follow up plan: Return in about 6 weeks (around 12/16/2015) for thyroid lab.  AleNobie PutnamO [place]  [facility], PGY-3

## 2015-11-04 NOTE — Patient Instructions (Signed)
Thank you for coming in to clinic today.  1. It sounds like overall you are doing well. 2. Sent new rx for thyroid medicine - 73mcg daily now, we can do a 3 month supply in future if this dose is the correct one 3. Refilled Losartan for 90 day supply 4. You may stop Diltiazem as you have 5. May take Tylenol PM 6. Keep up good sleep hygiene habits - may try to do a little more walking and physical activity to help you sleep at night  - Please schedule a "Lab Only" visit (early morning, 8 to 9:00am) to get your blood work drawn here at our clinic. - in 6 weeks - You need to be fasting (No food or drink after midnight, and nothing in the morning before your blood draw). - I have already ordered your blood work (orders will be good for few months), so you may schedule this appointment at your convenience. - We can discuss results at next appointment, otherwise our office will contact you with results by phone or with a letter  Please schedule a follow-up appointment with Dr. Parks Ranger anytime in next 1 to 3 months to follow-up insomnia  If you have any other questions or concerns, please feel free to call the clinic to contact me. You may also schedule an earlier appointment if necessary.  However, if your symptoms get significantly worse, please go to the Emergency Department to seek immediate medical attention.  [name], [place]

## 2015-11-06 NOTE — Assessment & Plan Note (Signed)
Controlled, asymptomatic. Last TSH 5.776 (06/2015)  Plan: 1. Titrate up levothyroxine from 62.85mcg daily to 47mcg daily, sent new rx 2. Advised to schedule future Lab Only visit in 6 weeks - placed order for TSH

## 2015-11-06 NOTE — Assessment & Plan Note (Signed)
Suspected secondary to chronic anxiety and some potential depression related to husband passing 1 year ago. - Seems to have very good sleep hygiene by report. Difficulty with sleep latency less problem with onset. - On Buspar for anxiety. Has ativan PRN sleep but does not like to take. Tries tylenol PM with some relief. - reported PHQ9 score of 0 to nursing  Plan: 1. Recommend to improve sleep hygiene, specifically start regular daily exercise during day 2. Can use Tylenol PM PRN 3. Plan to further explore insomnia, anxiety, depression at next visit, may benefit for returning to SSRI

## 2015-11-06 NOTE — Assessment & Plan Note (Signed)
Controlled HTN, BP today mildly elevated at SBP 140s, within goal of < 150/90  Meds - previously on losartan 50mg  (not on active list today), self discontinued Diltiazem (was on prior for CP, vasospasm) No complications   Plan:  1. Re-check BP at next visit, re-evaluate anti-HTN meds 2. Last BMET SCr normal 3. Lifestyle Mods - Goal to improve exercise, Dec salt intake, inc K+ rich vegs 4. Monitor BP at home or at drug store occasionally.

## 2015-11-06 NOTE — Assessment & Plan Note (Signed)
Prior h/o Vit D deficiency, last checked at 31 (2011), prior on supplement up to 5k iu daily - Osteopenia with last DEXA 06/2015  Plan: 1. Recently advised her to continue Vitamin D 2,000iu daily maintenance 2. Ordered future Vitamin D to check in 6 weeks with TSH

## 2015-12-21 ENCOUNTER — Other Ambulatory Visit: Payer: Medicare Other

## 2015-12-21 DIAGNOSIS — E559 Vitamin D deficiency, unspecified: Secondary | ICD-10-CM | POA: Diagnosis not present

## 2015-12-21 DIAGNOSIS — E039 Hypothyroidism, unspecified: Secondary | ICD-10-CM

## 2015-12-21 LAB — TSH: TSH: 2.577 u[IU]/mL (ref 0.350–4.500)

## 2015-12-21 NOTE — Progress Notes (Signed)
Tsh and vit d done today [facility] Degan Hanser

## 2015-12-22 ENCOUNTER — Other Ambulatory Visit: Payer: Self-pay | Admitting: Family Medicine

## 2015-12-22 ENCOUNTER — Encounter: Payer: Self-pay | Admitting: Family Medicine

## 2015-12-22 DIAGNOSIS — E559 Vitamin D deficiency, unspecified: Secondary | ICD-10-CM

## 2015-12-22 DIAGNOSIS — E039 Hypothyroidism, unspecified: Secondary | ICD-10-CM

## 2015-12-22 LAB — VITAMIN D 25 HYDROXY (VIT D DEFICIENCY, FRACTURES): Vit D, 25-Hydroxy: 54 ng/mL (ref 30–100)

## 2015-12-22 MED ORDER — CHOLECALCIFEROL 50 MCG (2000 UT) PO CAPS
2000.0000 [IU] | ORAL_CAPSULE | Freq: Every day | ORAL | Status: DC
Start: 2015-12-22 — End: 2017-08-12

## 2015-12-22 MED ORDER — LEVOTHYROXINE SODIUM 75 MCG PO TABS: 75.0000 ug | ORAL_TABLET | Freq: Every day | ORAL | Status: DC

## 2016-01-25 ENCOUNTER — Other Ambulatory Visit: Payer: Self-pay | Admitting: Family Medicine

## 2016-01-25 DIAGNOSIS — E039 Hypothyroidism, unspecified: Secondary | ICD-10-CM

## 2016-01-30 DIAGNOSIS — Z9842 Cataract extraction status, left eye: Secondary | ICD-10-CM | POA: Diagnosis not present

## 2016-01-30 DIAGNOSIS — H00025 Hordeolum internum left lower eyelid: Secondary | ICD-10-CM | POA: Diagnosis not present

## 2016-01-30 DIAGNOSIS — H353133 Nonexudative age-related macular degeneration, bilateral, advanced atrophic without subfoveal involvement: Secondary | ICD-10-CM | POA: Diagnosis not present

## 2016-01-30 DIAGNOSIS — H52223 Regular astigmatism, bilateral: Secondary | ICD-10-CM | POA: Diagnosis not present

## 2016-01-30 DIAGNOSIS — H00022 Hordeolum internum right lower eyelid: Secondary | ICD-10-CM | POA: Diagnosis not present

## 2016-01-30 DIAGNOSIS — H02054 Trichiasis without entropian left upper eyelid: Secondary | ICD-10-CM | POA: Diagnosis not present

## 2016-05-07 DIAGNOSIS — H02054 Trichiasis without entropian left upper eyelid: Secondary | ICD-10-CM | POA: Diagnosis not present

## 2016-06-20 ENCOUNTER — Ambulatory Visit (INDEPENDENT_AMBULATORY_CARE_PROVIDER_SITE_OTHER): Payer: Medicare Other | Admitting: Family Medicine

## 2016-06-20 ENCOUNTER — Encounter: Payer: Self-pay | Admitting: Family Medicine

## 2016-06-20 VITALS — BP 145/73 | HR 77 | Temp 98.4°F | Ht 65.0 in | Wt 174.2 lb

## 2016-06-20 DIAGNOSIS — F411 Generalized anxiety disorder: Secondary | ICD-10-CM | POA: Diagnosis not present

## 2016-06-20 DIAGNOSIS — E559 Vitamin D deficiency, unspecified: Secondary | ICD-10-CM

## 2016-06-20 DIAGNOSIS — E039 Hypothyroidism, unspecified: Secondary | ICD-10-CM

## 2016-06-20 DIAGNOSIS — M8580 Other specified disorders of bone density and structure, unspecified site: Secondary | ICD-10-CM

## 2016-06-20 DIAGNOSIS — I10 Essential (primary) hypertension: Secondary | ICD-10-CM | POA: Diagnosis not present

## 2016-06-20 DIAGNOSIS — K219 Gastro-esophageal reflux disease without esophagitis: Secondary | ICD-10-CM

## 2016-06-20 DIAGNOSIS — I2510 Atherosclerotic heart disease of native coronary artery without angina pectoris: Secondary | ICD-10-CM

## 2016-06-20 DIAGNOSIS — R7309 Other abnormal glucose: Secondary | ICD-10-CM

## 2016-06-20 DIAGNOSIS — E785 Hyperlipidemia, unspecified: Secondary | ICD-10-CM | POA: Diagnosis not present

## 2016-06-20 DIAGNOSIS — Z0000 Encounter for general adult medical examination without abnormal findings: Secondary | ICD-10-CM

## 2016-06-20 LAB — COMPLETE METABOLIC PANEL WITH GFR
ALBUMIN: 4.3 g/dL (ref 3.6–5.1)
ALK PHOS: 74 U/L (ref 33–130)
ALT: 14 U/L (ref 6–29)
AST: 14 U/L (ref 10–35)
BUN: 18 mg/dL (ref 7–25)
CHLORIDE: 102 mmol/L (ref 98–110)
CO2: 28 mmol/L (ref 20–31)
Calcium: 9.5 mg/dL (ref 8.6–10.4)
Creat: 1.03 mg/dL — ABNORMAL HIGH (ref 0.60–0.93)
GFR, EST NON AFRICAN AMERICAN: 53 mL/min — AB (ref >=60)
GFR, Est African American: 61 mL/min (ref >=60)
GLUCOSE: 112 mg/dL — AB (ref 65–99)
POTASSIUM: 4.6 mmol/L (ref 3.5–5.3)
SODIUM: 139 mmol/L (ref 135–146)
Total Bilirubin: 0.4 mg/dL (ref 0.2–1.2)
Total Protein: 6.8 g/dL (ref 6.1–8.1)

## 2016-06-20 LAB — TSH: TSH: 1.63 m[IU]/L

## 2016-06-20 LAB — LDL CHOLESTEROL, DIRECT: Direct LDL: 95 mg/dL (ref <130)

## 2016-06-20 MED ORDER — ASPIRIN 81 MG PO TBEC: 81.0000 mg | DELAYED_RELEASE_TABLET | Freq: Every day | ORAL | Status: DC

## 2016-06-20 MED ORDER — SIMVASTATIN 40 MG PO TABS: 40.0000 mg | ORAL_TABLET | Freq: Every day | ORAL | Status: DC

## 2016-06-20 MED ORDER — BUSPIRONE HCL 7.5 MG PO TABS: 7.5000 mg | ORAL_TABLET | Freq: Two times a day (BID) | ORAL | Status: DC

## 2016-06-20 MED ORDER — PANTOPRAZOLE SODIUM 40 MG PO TBEC: 40.0000 mg | DELAYED_RELEASE_TABLET | Freq: Every day | ORAL | Status: DC

## 2016-06-20 NOTE — Assessment & Plan Note (Signed)
Improved on Buspar. Self discontinued Lorazepam. Sleep improved. Refill Buspar today.

## 2016-06-20 NOTE — Progress Notes (Addendum)
Subjective:    Patient ID: Kristina Carlson, female    DOB: 1936-01-27, 80 y.o.   MRN: [id number]  Kristina Carlson is a 80 y.o. female presenting on 06/20/2016 for Follow-up   HPI  BILATERAL LOWER LEG PAIN: - Reports new symptom present past several months without known onset, worse Right lateral leg compared to Left, moderate pain up to 4/10, occasionally during the day but mostly present only at night, does not wake her up but she notices it if awake (prior problems with insomnia), not worse with activity. Not taking any medications for it. Tried some topical muscle rub with mild relief. - No prior history of DVT - Has been taking Simvastatin for "years" and never had similar problem with any other muscle aches - Denies muscle weakness, redness, swelling  HYPOTHYROIDISM, follow-up: - Last visit 10/2015 for same complaint, had increased Levothyroxine from 62.33mcg to 75 mcg given elevated TSH to 5.7, seems to be tolerating this dose well. She is overdue for TSH re-check. >6 months later. - No additional concerns regarding thyroid today - Denies fatigue, weight gain, weakness  HLD: Last fasting lipid panel 06/2015, with low HDL otherwise stable cholesterol and LDL 66 on simvastatin 40mg  daily, had been tolerating well without myalgia, however see above leg pain complaint. Request refill Zocor  Vitamin D Deficiency / Osteopenia - Known osteopenia last DEXA 06/2015 - Last vitamin D level up to 48 in (11/2015), has continued to take maintenance Vitamin D supplement 2,000iu daily, previously took higher dose of 5k daily. Due for repeat Vitamin D level today.  INSOMNIA, with chronic anxiety: - Request refill on Buspar today. Seems to be doing overall better with regards to anxiety and panic attacks. Sleeping better. No longer using Lorazepam 0.5mg  PRN anxiety. No refill on this today.  PMH: GERD - request refill Protonix CAD - Denies any CP, SOB, DOE - requests refill on ASA 81 mg daily  Social  History  Substance Use Topics  . Smoking status: Passive Smoke Exposure - Never Smoker  . Smokeless tobacco: None  . Alcohol Use: No    Review of Systems Per HPI unless specifically indicated above     Objective:    BP 145/73 mmHg  Pulse 77  Temp(Src) 98.4 F (36.9 C) (Oral)  Ht 5\' 5"  (1.651 m)  Wt 174 lb 3.2 oz (79.017 kg)  BMI 28.99 kg/m2  Wt Readings from Last 3 Encounters:  06/20/16 174 lb 3.2 oz (79.017 kg)  11/04/15 171 lb (77.565 kg)  06/22/15 167 lb 14.4 oz (76.159 kg)    Physical Exam  Constitutional: She is oriented to person, place, and time. She appears well-developed and well-nourished. No distress.  Well-appearing, comfortable, cooperative  HENT:  Mouth/Throat: Oropharynx is clear and moist.  Neck: Normal range of motion. Neck supple. No thyromegaly present.  Cardiovascular: Normal rate, regular rhythm, normal heart sounds and intact distal pulses.   No murmur heard. Pulmonary/Chest: Effort normal and breath sounds normal. No respiratory distress. She has no wheezes. She has no rales.  Musculoskeletal: She exhibits no edema or tenderness (Bilateral lower extremity non-tender to palpation over Right lateral leg where reports of pain usually occur).  Lower legs without erythema, ulceration or any skin changes.  Lymphadenopathy:    She has no cervical adenopathy.  Neurological: She is alert and oriented to person, place, and time.  Skin: Skin is warm and dry. No rash noted. She is not diaphoretic.  Psychiatric: She has a normal mood and  affect. Her behavior is normal.  Nursing note and vitals reviewed.  Results for orders placed or performed in visit on 06/20/16  COMPLETE METABOLIC PANEL WITH GFR  Result Value Ref Range   Sodium 139 135 - 146 mmol/L   Potassium 4.6 3.5 - 5.3 mmol/L   Chloride 102 98 - 110 mmol/L   CO2 28 20 - 31 mmol/L   Glucose, Bld 112 (H) 65 - 99 mg/dL   BUN 18 7 - 25 mg/dL   Creat 1.03 (H) 0.60 - 0.93 mg/dL   Total Bilirubin 0.4 0.2  - 1.2 mg/dL   Alkaline Phosphatase 74 33 - 130 U/L   AST 14 10 - 35 U/L   ALT 14 6 - 29 U/L   Total Protein 6.8 6.1 - 8.1 g/dL   Albumin 4.3 3.6 - 5.1 g/dL   Calcium 9.5 8.6 - 10.4 mg/dL   GFR, Est African American 61 >=60 mL/min   GFR, Est Non African American 53 (L) >=60 mL/min  TSH  Result Value Ref Range   TSH 1.63 mIU/L  VITAMIN D 25 Hydroxy (Vit-D Deficiency, Fractures)  Result Value Ref Range   Vit D, 25-Hydroxy 52 30 - 100 ng/mL  LDL cholesterol, direct  Result Value Ref Range   Direct LDL 95 <130 mg/dL      Assessment & Plan:   Problem List Items Addressed This Visit    Vitamin D deficiency    Stable since last Vitamin D >50, on daily maintenance Continue Vitamin D supplement 2,000iu daily maintenance Check Vitamin D today      Relevant Orders   VITAMIN D 25 Hydroxy (Vit-D Deficiency, Fractures) (Completed)   Other abnormal glucose    Mild elevated glucose on fasting chemistry to 112, chart review with prior normal < 100 and some other abnormal glucose. Do not see A1c screening for Pre-Diabetes / Diabetes in past. Recommend check A1c at next office visit. Sent letter to patient discussing results and this recommendation.      Osteopenia    Future repeat DEXA monitoring if concern for worsening, can repeat within 1-2 years. Sooner if fracture. Already on Vitamin D Future discussion with bisphosphonate trial      Hypothyroidism    Stable, asymptomatic. Last TSH 2.577 (11/2015)  Plan: 1. Re-check TSH today 2. Continue Levothryoxine 57mcg daily at this time, adjust pending TSH      Relevant Orders   TSH (Completed)   Hypertension, essential, benign - Primary    Controlled, BP near goal today < [id number] No complications  Plan: 1. Continue Losartan+K 50mg  daily 2. Check CMET today follow Cr, previously stable 3. Encouraged some mild daily exercise with walking 4. Monitor BP outside office      Relevant Medications   aspirin 81 MG EC tablet    simvastatin (ZOCOR) 40 MG tablet   Other Relevant Orders   COMPLETE METABOLIC PANEL WITH GFR (Completed)   HLD (hyperlipidemia)    Prior stable lipids with controlled LDL. Fasting today but too early for full lipid panel not covered by medicare. Check direct LDL today, given already on statin for LDL monitoring Concern possible mild myalgia in lower ext from statin, chronic problem mild without worsening, stable LFTs. Consider future change in statin if worsening or persistent, can reduce dose or go to more infrequent dosing if needed      Relevant Medications   aspirin 81 MG EC tablet   simvastatin (ZOCOR) 40 MG tablet   Other Relevant Orders  LDL cholesterol, direct (Completed)   Health maintenance examination    Encouraged to fill Zostavax rx, given last year pharmacy has on file. If unable to obtain vaccination, can notify clinic for repeat rx      GERD (gastroesophageal reflux disease)   Relevant Medications   pantoprazole (PROTONIX) 40 MG tablet   CAD- non obstuctive CAD at cath 06/29/14    Stable. Refilled ASA 81 daily      Relevant Medications   aspirin 81 MG EC tablet   simvastatin (ZOCOR) 40 MG tablet   Anxiety state    Improved on Buspar. Self discontinued Lorazepam. Sleep improved. Refill Buspar today.      Relevant Medications   busPIRone (BUSPAR) 7.5 MG tablet      Additionally for R>L Lower extremity pain, seems most likely MSK etiology vs statin myalgia, chronic problem without worsening, perhaps related to inactivity, otherwise no red flags without edema or concern for DVT. Not related to activity unlikely claudication / vascular or neurological. - Reassurance - May try compression overnight with ace wrap to see if helps relieve symptoms, may continue muscle rub - Future consider trial off or change of statin, if worsening may check CK, prior normal LFTs  Meds ordered this encounter  Medications  . pantoprazole (PROTONIX) 40 MG tablet    Sig: Take 1  tablet (40 mg total) by mouth daily.    Dispense:  90 tablet    Refill:  1  . busPIRone (BUSPAR) 7.5 MG tablet    Sig: Take 1 tablet (7.5 mg total) by mouth 2 (two) times daily.    Dispense:  180 tablet    Refill:  1  . aspirin 81 MG EC tablet    Sig: Take 1 tablet (81 mg total) by mouth daily. Swallow whole.    Dispense:  90 tablet    Refill:  3  . simvastatin (ZOCOR) 40 MG tablet    Sig: Take 1 tablet (40 mg total) by mouth at bedtime.    Dispense:  90 tablet    Refill:  1      Follow up plan: Return in about 3 months (around 09/20/2016) for check-up, meet new doctor, follow TSH.  Check A1c  [name], [place], PGY-3

## 2016-06-20 NOTE — Assessment & Plan Note (Signed)
Prior stable lipids with controlled LDL. Fasting today but too early for full lipid panel not covered by medicare. Check direct LDL today, given already on statin for LDL monitoring Concern possible mild myalgia in lower ext from statin, chronic problem mild without worsening, stable LFTs. Consider future change in statin if worsening or persistent, can reduce dose or go to more infrequent dosing if needed

## 2016-06-20 NOTE — Assessment & Plan Note (Signed)
Future repeat DEXA monitoring if concern for worsening, can repeat within 1-2 years. Sooner if fracture. Already on Vitamin D Future discussion with bisphosphonate trial

## 2016-06-20 NOTE — Assessment & Plan Note (Signed)
Stable since last Vitamin D >50, on daily maintenance Continue Vitamin D supplement 2,000iu daily maintenance Check Vitamin D today

## 2016-06-20 NOTE — Assessment & Plan Note (Signed)
Stable, asymptomatic. Last TSH 2.577 (11/2015)  Plan: 1. Re-check TSH today 2. Continue Levothryoxine 6mcg daily at this time, adjust pending TSH

## 2016-06-20 NOTE — Assessment & Plan Note (Signed)
Controlled, BP near goal today < [id number] No complications  Plan: 1. Continue Losartan+K 50mg  daily 2. Check CMET today follow Cr, previously stable 3. Encouraged some mild daily exercise with walking 4. Monitor BP outside office

## 2016-06-20 NOTE — Patient Instructions (Signed)
Thank you for coming in to clinic today.  1. Overall it sounds like you are doing well. 2. The leg pain may be muscle aches, from inactivity or could be due to your cholesterol medication, simvastatin. This should not get worse, if it does we can adjust your medication. Otherwise I am reassured that it does not sound like other significant leg problems (not consistent with vascular problem, nerve problem, restless leg, blood clot) 3. Refilled meds 4. Recommend Shingles vaccine (Zostavax) at pharmacy, please bring [place] a copy of the paper that shows you received the vaccine and we can update your record  Blood work today will mail letter or call soon with results if need to change Thyroid medicine  Please schedule a follow-up appointment with  in Dr Lorenso Courier within 3 months to meet new doctor and follow-up (she will be a 3rd year resident as well, only here for 1 year)  If you have any other questions or concerns, please feel free to call the clinic to contact me. You may also schedule an earlier appointment if necessary.  However, if your symptoms get significantly worse, please go to the Emergency Department to seek immediate medical attention.  [name], [place]

## 2016-06-20 NOTE — Assessment & Plan Note (Signed)
Encouraged to fill Zostavax rx, given last year pharmacy has on file. If unable to obtain vaccination, can notify clinic for repeat rx

## 2016-06-20 NOTE — Assessment & Plan Note (Signed)
Stable. Refilled ASA 81 daily

## 2016-06-21 ENCOUNTER — Encounter: Payer: Self-pay | Admitting: Family Medicine

## 2016-06-21 DIAGNOSIS — R7309 Other abnormal glucose: Secondary | ICD-10-CM | POA: Insufficient documentation

## 2016-06-21 LAB — VITAMIN D 25 HYDROXY (VIT D DEFICIENCY, FRACTURES): Vit D, 25-Hydroxy: 52 ng/mL (ref 30–100)

## 2016-06-21 NOTE — Assessment & Plan Note (Signed)
Mild elevated glucose on fasting chemistry to 112, chart review with prior normal < 100 and some other abnormal glucose. Do not see A1c screening for Pre-Diabetes / Diabetes in past. Recommend check A1c at next office visit. Sent letter to patient discussing results and this recommendation.

## 2016-07-04 DIAGNOSIS — M9903 Segmental and somatic dysfunction of lumbar region: Secondary | ICD-10-CM | POA: Diagnosis not present

## 2016-07-04 DIAGNOSIS — M9904 Segmental and somatic dysfunction of sacral region: Secondary | ICD-10-CM | POA: Diagnosis not present

## 2016-07-04 DIAGNOSIS — M545 Low back pain: Secondary | ICD-10-CM | POA: Diagnosis not present

## 2016-07-04 DIAGNOSIS — M5136 Other intervertebral disc degeneration, lumbar region: Secondary | ICD-10-CM | POA: Diagnosis not present

## 2016-07-04 DIAGNOSIS — M5416 Radiculopathy, lumbar region: Secondary | ICD-10-CM | POA: Diagnosis not present

## 2016-07-18 ENCOUNTER — Other Ambulatory Visit: Payer: Self-pay | Admitting: Family Medicine

## 2016-07-18 DIAGNOSIS — E039 Hypothyroidism, unspecified: Secondary | ICD-10-CM

## 2016-07-18 NOTE — Telephone Encounter (Signed)
Refilled levothyroxine.  Archie Patten, MD [facility] Resident  07/18/2016, 1:40 PM

## 2016-07-30 ENCOUNTER — Other Ambulatory Visit: Payer: Self-pay | Admitting: Family Medicine

## 2016-07-30 DIAGNOSIS — H353132 Nonexudative age-related macular degeneration, bilateral, intermediate dry stage: Secondary | ICD-10-CM | POA: Diagnosis not present

## 2016-07-30 DIAGNOSIS — H00025 Hordeolum internum left lower eyelid: Secondary | ICD-10-CM | POA: Diagnosis not present

## 2016-07-30 DIAGNOSIS — H02054 Trichiasis without entropian left upper eyelid: Secondary | ICD-10-CM | POA: Diagnosis not present

## 2016-07-30 DIAGNOSIS — Z1231 Encounter for screening mammogram for malignant neoplasm of breast: Secondary | ICD-10-CM

## 2016-07-30 DIAGNOSIS — H00022 Hordeolum internum right lower eyelid: Secondary | ICD-10-CM | POA: Diagnosis not present

## 2016-08-21 DIAGNOSIS — H02054 Trichiasis without entropian left upper eyelid: Secondary | ICD-10-CM | POA: Diagnosis not present

## 2016-09-03 ENCOUNTER — Ambulatory Visit
Admission: RE | Admit: 2016-09-03 | Discharge: 2016-09-03 | Disposition: A | Discharge status: Home / Self Care | Payer: Medicare Other | Source: Ambulatory Visit | Attending: Internal Medicine | Admitting: Internal Medicine

## 2016-09-03 DIAGNOSIS — Z1231 Encounter for screening mammogram for malignant neoplasm of breast: Secondary | ICD-10-CM

## 2016-10-03 ENCOUNTER — Encounter: Payer: Self-pay | Admitting: Family Medicine

## 2016-10-03 ENCOUNTER — Ambulatory Visit (INDEPENDENT_AMBULATORY_CARE_PROVIDER_SITE_OTHER): Payer: Medicare Other | Admitting: Family Medicine

## 2016-10-03 VITALS — BP 146/88 | HR 76 | Temp 98.1°F | Ht 65.0 in | Wt 175.0 lb

## 2016-10-03 DIAGNOSIS — M791 Myalgia, unspecified site: Secondary | ICD-10-CM

## 2016-10-03 DIAGNOSIS — Z0000 Encounter for general adult medical examination without abnormal findings: Secondary | ICD-10-CM

## 2016-10-03 DIAGNOSIS — I10 Essential (primary) hypertension: Secondary | ICD-10-CM | POA: Diagnosis not present

## 2016-10-03 DIAGNOSIS — N8189 Other female genital prolapse: Secondary | ICD-10-CM

## 2016-10-03 DIAGNOSIS — K219 Gastro-esophageal reflux disease without esophagitis: Secondary | ICD-10-CM

## 2016-10-03 DIAGNOSIS — R7309 Other abnormal glucose: Secondary | ICD-10-CM

## 2016-10-03 LAB — POCT GLYCOSYLATED HEMOGLOBIN (HGB A1C): HEMOGLOBIN A1C: 5.5

## 2016-10-03 LAB — COMPLETE METABOLIC PANEL WITH GFR
ALT: 14 U/L (ref 6–29)
AST: 15 U/L (ref 10–35)
Albumin: 4.1 g/dL (ref 3.6–5.1)
Alkaline Phosphatase: 67 U/L (ref 33–130)
BUN: 14 mg/dL (ref 7–25)
CALCIUM: 9.7 mg/dL (ref 8.6–10.4)
CHLORIDE: 104 mmol/L (ref 98–110)
CO2: 28 mmol/L (ref 20–31)
CREATININE: 1 mg/dL — AB (ref 0.60–0.93)
GFR, Est African American: 62 mL/min (ref >=60)
GFR, Est Non African American: 54 mL/min — ABNORMAL LOW (ref >=60)
GLUCOSE: 85 mg/dL (ref 65–99)
POTASSIUM: 4.2 mmol/L (ref 3.5–5.3)
SODIUM: 142 mmol/L (ref 135–146)
Total Bilirubin: 0.4 mg/dL (ref 0.2–1.2)
Total Protein: 6.7 g/dL (ref 6.1–8.1)

## 2016-10-03 LAB — CK: Total CK: 108 U/L (ref 7–177)

## 2016-10-03 MED ORDER — LOSARTAN POTASSIUM 50 MG PO TABS: 50.0000 mg | ORAL_TABLET | Freq: Every day | ORAL | 3 refills | Status: DC

## 2016-10-03 NOTE — Assessment & Plan Note (Signed)
BP at goal per JNC 8 guidelines. - refilled losartan.

## 2016-10-03 NOTE — Patient Instructions (Addendum)
It was nice to meet you. I did not see any evidence of uterine prolapse on my exam today, I did see some weak pelvic floor muscles. I have refilled your losartan. Stop taking the simvastatin, I will call you the results of your labwork from today.   Kegel Exercises The goal of Kegel exercises is to isolate and exercise your pelvic floor muscles. These muscles act as a hammock that supports the rectum, vagina, small intestine, and uterus. As the muscles weaken, the hammock sags and these organs are displaced from their normal positions. Kegel exercises can strengthen your pelvic floor muscles and help you to improve bladder and bowel control, improve sexual response, and help reduce many problems and some discomfort during pregnancy. Kegel exercises can be done anywhere and at any time. HOW TO PERFORM KEGEL EXERCISES 1. Locate your pelvic floor muscles. To do this, squeeze (contract) the muscles that you use when you try to stop the flow of urine. You will feel a tightness in the vaginal area (women) and a tight lift in the rectal area (men and women). 2. When you begin, contract your pelvic muscles tight for 2-5 seconds, then relax them for 2-5 seconds. This is one set. Do 4-5 sets with a short pause in between. 3. Contract your pelvic muscles for 8-10 seconds, then relax them for 8-10 seconds. Do 4-5 sets. If you cannot contract your pelvic muscles for 8-10 seconds, try 5-7 seconds and work your way up to 8-10 seconds. Your goal is 4-5 sets of 10 contractions each day. Keep your stomach, buttocks, and legs relaxed during the exercises. Perform sets of both short and long contractions. Vary your positions. Perform these contractions 3-4 times per day. Perform sets while you are:   Lying in bed in the morning.  Standing at lunch.  Sitting in the late afternoon.  Lying in bed at night. You should do 40-50 contractions per day. Do not perform more Kegel exercises per day than recommended.  Overexercising can cause muscle fatigue. Continue these exercises for for at least 15-20 weeks or as directed by your caregiver.   This information is not intended to replace advice given to you by your health care provider. Make sure you discuss any questions you have with your health care provider.   Document Released: 11/26/2012 Document Revised: 12/31/2014 Document Reviewed: 11/26/2012 Elsevier Interactive Patient Education [employer].

## 2016-10-03 NOTE — Assessment & Plan Note (Signed)
Already received flu vaccine. Discussed shingles vaccine, pt defers due to cost but the Rx is on file at her pharmacy if needed.

## 2016-10-03 NOTE — Progress Notes (Signed)
Subjective: CC: meet new doctor, leg pain  HPI: Patient is a 80 y.o. female with a past medical history of bilateral leg pain, hypothyroidism, GERD, HLD, osteopenia, insomnia, anxiety  presenting to clinic today to meet her new doctor.   Bilateral leg pain: present for 5-6 months. Notes pain is in the thighs bilaterally. She can't describe the pain- just notes that it "hurts" and isn't shooting or sharp. Rated pain is a 4/10. Pain comes on sometimes without cause, othertimes she notes it with ambulation.  Sometimes the R anterior and anterior shin has numbness and tingling.  No cramping.  No weakness. No redness or swelling noted. She hasn't tried any medications for it. She asks for another medication besides simvastatin as her previous provider stated this could be the cause.  Stable thoracic back pain, she sees a Restaurant manager, fast food. No hip pain.   Vaginal issues: the patient notes the other day when she was bathing that "something felt weird."  She felt there may be extra tissue or something. Worried as she hasn't had a pap smear in over 10 years. Denies any pain, dysuria, vaginal discharge. She hasn't noticed this when having a BM. No urinary incontinence. No stool inconteience or constipation. She's had 1 vaginal delivery in the past.   Hypertension Blood pressure at home: doesn't check Meds: Compliant with losartan  Side effects: none ROS: Denies headache, dizziness, visual changes, nausea, vomiting, chest pain, abdominal pain or shortness of breath. Noted to have abnormal glucose on previous BMET- will get an A1c today per previous PCP  GERD: diagnosed in the ED after death of family member. She has started to taper down on protonix. Would like to stop this all other. No symptoms currently   Social History: never smoker, passive 2nd had smoke   Health Maintenance: due for Zostavax, notes she will not get this as the pharmacy charged > 1$100  ROS: All other systems reviewed and are  negative.  Past Medical History Patient Active Problem List   Diagnosis Date Noted  . Myalgia 10/03/2016  . Pelvic floor weakness in female 10/03/2016  . Other abnormal glucose 06/21/2016  . GERD (gastroesophageal reflux disease) 06/20/2016  . Osteopenia 06/20/2016  . Estrogen deficiency 06/22/2015  . Orthopnea 06/22/2015  . Anxiety state 07/15/2014  . CAD- non obstuctive CAD at cath 06/29/14 06/30/2014  . Hypertension, essential, benign 09/01/2012  . Health maintenance examination 03/11/2012  . Vitamin D deficiency 08/24/2010  . HLD (hyperlipidemia) 03/08/2010  . Hypothyroidism 02/20/2007  . INSOMNIA NOS 02/20/2007    Medications- reviewed and updated  Objective: Office vital signs reviewed. BP (!) 146/88 (BP Location: Right Arm, Patient Position: Sitting, Cuff Size: Large)   Pulse 76   Temp 98.1 F (36.7 C) (Oral)   Ht 5\' 5"  (1.651 m)   Wt 175 lb (79.4 kg)   BMI 29.12 kg/m    Physical Examination:  General: Awake, alert, well- nourished, NAD Cardio: RRR, no m/r/g noted. No thrill.  Pulm: No increased WOB.  CTAB, without wheezes, rhonchi or crackles noted.  GU: GYN:  External genitalia within normal limits.  Vaginal mucosa pink, moist, normal rugae.  Nonfriable cervix without lesions, no discharge or bleeding noted on speculum exam.  Bimanual exam revealed normal, nongravid uterus.  No cervical motion tenderness. No adnexal masses bilaterally. Some pouching base of the pelvic floor but not uterine or rectal prolapse noted.  Extremities: WWP, No edema, cyanosis or clubbing; no tenderness to palpation  +1 pulses bilaterally MSK: Normal  gait and station Skin: dry, intact, no rashes or lesions Neuro: Strength and sensation grossly intact in the LEs, 2/4  Assessment/Plan: Hypertension, essential, benign BP at goal per JNC 8 guidelines. - refilled losartan.   GERD (gastroesophageal reflux disease) Diagnosed in the ED. Was on protonix, notes she feels better and is slowly  titrating off. - d/c protonix. - may consider H2 blocker if needed in the future.   Health maintenance examination Already received flu vaccine. Discussed shingles vaccine, pt defers due to cost but the Rx is on file at her pharmacy if needed.   Other abnormal glucose Will get A1c today.  Myalgia I am not convinced her symptoms are secondary to statin, however I have been tricked in the past. Myalgias are intermittent.  - d/c statin for now - will get CMP and CK - will f/u with patient on these results  Pelvic floor weakness in female Weakness and decreased tone of the pelvic floor noted. No prolapse noted fortunately. Additionally, no other symptoms such as difficulty/change in urination or defecation. - discussed Kegel exercises. - discussed return precautions.   Orders Placed This Encounter  Procedures  . CK  . COMPLETE METABOLIC PANEL WITH GFR  . POCT A1C    Meds ordered this encounter  Medications  . losartan (COZAAR) 50 MG tablet    Sig: Take 1 tablet (50 mg total) by mouth daily.    Dispense:  90 tablet    Refill:  [place] PGY-3, [place]

## 2016-10-03 NOTE — Assessment & Plan Note (Signed)
Will get A1c today.

## 2016-10-03 NOTE — Assessment & Plan Note (Signed)
Diagnosed in the ED. Was on protonix, notes she feels better and is slowly titrating off. - d/c protonix. - may consider H2 blocker if needed in the future.

## 2016-10-03 NOTE — Assessment & Plan Note (Signed)
I am not convinced her symptoms are secondary to statin, however I have been tricked in the past. Myalgias are intermittent.  - d/c statin for now - will get CMP and CK - will f/u with patient on these results

## 2016-10-03 NOTE — Assessment & Plan Note (Signed)
Weakness and decreased tone of the pelvic floor noted. No prolapse noted fortunately. Additionally, no other symptoms such as difficulty/change in urination or defecation. - discussed Kegel exercises. - discussed return precautions.

## 2016-10-12 ENCOUNTER — Encounter: Payer: Self-pay | Admitting: Family Medicine

## 2017-01-06 ENCOUNTER — Other Ambulatory Visit: Payer: Self-pay | Admitting: Family Medicine

## 2017-01-06 DIAGNOSIS — E039 Hypothyroidism, unspecified: Secondary | ICD-10-CM

## 2017-01-29 DIAGNOSIS — H02054 Trichiasis without entropian left upper eyelid: Secondary | ICD-10-CM | POA: Diagnosis not present

## 2017-01-29 DIAGNOSIS — Z9842 Cataract extraction status, left eye: Secondary | ICD-10-CM | POA: Diagnosis not present

## 2017-01-29 DIAGNOSIS — Z9841 Cataract extraction status, right eye: Secondary | ICD-10-CM | POA: Diagnosis not present

## 2017-01-29 DIAGNOSIS — H353133 Nonexudative age-related macular degeneration, bilateral, advanced atrophic without subfoveal involvement: Secondary | ICD-10-CM | POA: Diagnosis not present

## 2017-02-01 ENCOUNTER — Other Ambulatory Visit: Payer: Self-pay | Admitting: *Deleted

## 2017-02-01 MED ORDER — LEVOTHYROXINE SODIUM 75 MCG PO TABS: ORAL_TABLET | ORAL | 0 refills | Status: DC

## 2017-02-01 NOTE — Telephone Encounter (Signed)
Refill request for 90 day supply.  Martin, Tamika L, RN  

## 2017-03-04 ENCOUNTER — Other Ambulatory Visit: Payer: Self-pay | Admitting: *Deleted

## 2017-03-04 MED ORDER — LEVOTHYROXINE SODIUM 75 MCG PO TABS: ORAL_TABLET | ORAL | 1 refills | Status: DC

## 2017-04-25 ENCOUNTER — Other Ambulatory Visit: Payer: Self-pay | Admitting: Family Medicine

## 2017-04-25 NOTE — Telephone Encounter (Signed)
Refilled Synthroid x 2 months, pt needs f/u in the new few months.  Thanks, Archie Patten, MD [facility] Resident  04/25/2017, 5:14 PM

## 2017-06-01 ENCOUNTER — Other Ambulatory Visit: Payer: Self-pay | Admitting: Family Medicine

## 2017-06-01 DIAGNOSIS — I2510 Atherosclerotic heart disease of native coronary artery without angina pectoris: Secondary | ICD-10-CM

## 2017-06-02 ENCOUNTER — Other Ambulatory Visit: Payer: Self-pay | Admitting: Family Medicine

## 2017-06-02 DIAGNOSIS — I2510 Atherosclerotic heart disease of native coronary artery without angina pectoris: Secondary | ICD-10-CM

## 2017-07-23 ENCOUNTER — Other Ambulatory Visit: Payer: Self-pay | Admitting: *Deleted

## 2017-07-23 MED ORDER — LEVOTHYROXINE SODIUM 75 MCG PO TABS: ORAL_TABLET | ORAL | 1 refills | Status: DC

## 2017-07-29 DIAGNOSIS — H353133 Nonexudative age-related macular degeneration, bilateral, advanced atrophic without subfoveal involvement: Secondary | ICD-10-CM | POA: Diagnosis not present

## 2017-08-11 NOTE — Progress Notes (Signed)
   Subjective:   Patient ID: Kristina Carlson    DOB: 10/04/1936, 80 y.o. female   MRN: [id number]  CC: "Meet new PCP"  HPI: Kristina Carlson is a 80 y.o. female who presents to clinic today to establish care with new PCP. Problems discussed today are as follows:  Hypothyroidism: Patient currently on Synthroid 75 g daily. Has been on thyroid medication for many years. No complaints today. ROS: Denies heat or cold intolerance, hair loss, change in weight, constipation or diarrhea.  Coronary artery disease: Diagnosed by left heart cath in 2015. Previously on simvastatin but discontinued due to suspicion for myalgias. Proximal lower extremity pain resolved after discontinuation. Patient requesting new cholesterol medication. ROS: Denies fatigue, chest pain, shortness of breath, palpitations, PND, orthopnea, feelings of syncope.  Hypertension: Currently on losartan. Never smoker.  Complete ROS performed, see HPI for pertinent.  PMFSH: Non-obstructive diffuse CAD, HTN, HLD, osteopenia, hypothyroidism, GERD. Smoking status reviewed. Medications reviewed.  Objective:   BP 130/84   Pulse 94   Temp 97.9 F (36.6 C) (Oral)   Ht 5' 5" (1.651 m)   Wt 172 lb (78 kg)   BMI 28.62 kg/m  Vitals and nursing note reviewed.  General: well nourished, well developed, in no acute distress with non-toxic appearance HEENT: normocephalic, atraumatic, moist mucous membranes Neck: supple, non-tender without lymphadenopathy CV: regular rate and rhythm without murmurs, rubs, or gallops, no lower extremity edema Lungs: clear to auscultation bilaterally with normal work of breathing Abdomen: soft, non-tender, non-distended, no masses or organomegaly palpable, normoactive bowel sounds Skin: warm, dry, no rashes or lesions, cap refill < 2 seconds Extremities: warm and well perfused, normal tone  Assessment & Plan:   Primary hypertension Chronic. Control. Nonsmoker. No statin therapy. On aspirin  daily. --Continue losartan 50 mg daily --Initiating atorvastatin 40 mg daily, continue ASA 81 mg daily --Will check CBC with differential, CMP, LDL  Hypothyroidism Chronic. Stable. Last TSH in 2017 controlled. Denies new symptoms consistent with thyroid abnormalities. --Will check TSH level and adjust levothyroxine as appropriate  Coronary artery disease Chronic. Stable. No history of angina or dyspnea since LHC in 2015. Not currently on statin therapy due to history of myalgias with simvastatin. --Continue losartan 50 mg daily, ASA 81 mg daily, and will initiate atorvastatin 40 mg daily  Orders Placed This Encounter  Procedures  . CBC with Differential/Platelet  . CMP14+EGFR  . TSH  . LDL cholesterol, direct  . VITAMIN D 25 Hydroxy (Vit-D Deficiency, Fractures)   Meds ordered this encounter  Medications  . Calcium Carb-Cholecalciferol (CALCIUM 600+D3) 600-200 MG-UNIT TABS    Sig: Take 1 tablet by mouth daily.  . atorvastatin (LIPITOR) 40 MG tablet    Sig: Take 1 tablet (40 mg total) by mouth daily.    Dispense:  90 tablet    Refill:  3  . Zoster Vac Recomb Adjuvanted (SHINGRIX) injection    Sig: Inject 0.5 mLs into the muscle once.    Dispense:  0.5 mL    Refill:  0    David McMullen, DO [facility], PGY-2 08/12/2017 10:34 AM 

## 2017-08-11 NOTE — Assessment & Plan Note (Deleted)
Last bone scan 06/2015.

## 2017-08-12 ENCOUNTER — Ambulatory Visit (INDEPENDENT_AMBULATORY_CARE_PROVIDER_SITE_OTHER): Payer: Medicare Other | Admitting: Family Medicine

## 2017-08-12 ENCOUNTER — Encounter: Payer: Self-pay | Admitting: Family Medicine

## 2017-08-12 VITALS — BP 130/84 | HR 94 | Temp 97.9°F | Ht 65.0 in | Wt 172.0 lb

## 2017-08-12 DIAGNOSIS — I10 Essential (primary) hypertension: Secondary | ICD-10-CM

## 2017-08-12 DIAGNOSIS — K219 Gastro-esophageal reflux disease without esophagitis: Secondary | ICD-10-CM | POA: Diagnosis not present

## 2017-08-12 DIAGNOSIS — I2510 Atherosclerotic heart disease of native coronary artery without angina pectoris: Secondary | ICD-10-CM | POA: Diagnosis not present

## 2017-08-12 DIAGNOSIS — M8580 Other specified disorders of bone density and structure, unspecified site: Secondary | ICD-10-CM | POA: Diagnosis not present

## 2017-08-12 DIAGNOSIS — E039 Hypothyroidism, unspecified: Secondary | ICD-10-CM

## 2017-08-12 MED ORDER — ZOSTER VAC RECOMB ADJUVANTED 50 MCG/0.5ML IM SUSR: 0.5000 mL | Freq: Once | INTRAMUSCULAR | 0 refills | Status: AC

## 2017-08-12 MED ORDER — ATORVASTATIN CALCIUM 40 MG PO TABS: 40.0000 mg | ORAL_TABLET | Freq: Every day | ORAL | 3 refills | Status: DC

## 2017-08-12 NOTE — Assessment & Plan Note (Addendum)
Chronic. Control. Nonsmoker. No statin therapy. On aspirin daily. --Continue losartan 50 mg daily --Initiating atorvastatin 40 mg daily, continue ASA 81 mg daily --Will check CBC with differential, CMP, LDL

## 2017-08-12 NOTE — Assessment & Plan Note (Addendum)
Chronic. Stable. No history of angina or dyspnea since LHC in 2015. Not currently on statin therapy due to history of myalgias with simvastatin. --Continue losartan 50 mg daily, ASA 81 mg daily, and will initiate atorvastatin 40 mg daily

## 2017-08-12 NOTE — Assessment & Plan Note (Addendum)
Chronic. Stable. Last TSH in 2017 controlled. Denies new symptoms consistent with thyroid abnormalities. --Will check TSH level and adjust levothyroxine as appropriate

## 2017-08-12 NOTE — Patient Instructions (Signed)
Thank you for coming in to see [place] today. Please see below to review our plan for today's visit.  1. I will call you if your blood work is abnormal, otherwise expect to receive results in the mail. 2. Please go and receive your Shingrix shot to prevent shingles.  3. We will start you on a new cholesterol medication called atorvastatin (Lipitor). This is a cleaner medication and touch less likely to cause muscle pains. If you do experience new onset muscle pain, discontinuing this medication immediately. See below for information  Return to clinic in 1 month to check Lipitor.  Please call the clinic at [phone] if your symptoms worsen or you have any concerns. It was my pleasure to see you. -- Harriet [place], [place], PGY-2  Atorvastatin tablets What is this medicine? ATORVASTATIN (a TORE va sta tin) is known as a HMG-CoA reductase inhibitor or 'statin'. It lowers the level of cholesterol and triglycerides in the blood. This drug may also reduce the risk of heart attack, stroke, or other health problems in patients with risk factors for heart disease. Diet and lifestyle changes are often used with this drug. This medicine may be used for other purposes; ask your health care provider or pharmacist if you have questions. COMMON BRAND NAME(S): Lipitor What should I tell my health care provider before I take this medicine? They need to know if you have any of these conditions: -frequently drink alcoholic beverages -history of stroke, TIA -kidney disease -liver disease -muscle aches or weakness -other medical condition -an unusual or allergic reaction to atorvastatin, other medicines, foods, dyes, or preservatives -pregnant or trying to get pregnant -breast-feeding How should I use this medicine? Take this medicine by mouth with a glass of water. Follow the directions on the prescription label. You can take this medicine with or without food. Take your doses at  regular intervals. Do not take your medicine more often than directed. Talk to your pediatrician regarding the use of this medicine in children. While this drug may be prescribed for children as young as 50 years old for selected conditions, precautions do apply. Overdosage: If you think you have taken too much of this medicine contact a poison control center or emergency room at once. NOTE: This medicine is only for you. Do not share this medicine with others. What if I miss a dose? If you miss a dose, take it as soon as you can. If it is almost time for your next dose, take only that dose. Do not take double or extra doses. What may interact with this medicine? Do not take this medicine with any of the following medications: -red yeast rice -telaprevir -telithromycin -voriconazole This medicine may also interact with the following medications: -alcohol -antiviral medicines for HIV or AIDS -boceprevir -certain antibiotics like clarithromycin, erythromycin, troleandomycin -certain medicines for cholesterol like fenofibrate or gemfibrozil -cimetidine -clarithromycin -colchicine -cyclosporine -digoxin -female hormones, like estrogens or progestins and birth control pills -grapefruit juice -medicines for fungal infections like fluconazole, itraconazole, ketoconazole -niacin -rifampin -spironolactone This list may not describe all possible interactions. Give your health care provider a list of all the medicines, herbs, non-prescription drugs, or dietary supplements you use. Also tell them if you smoke, drink alcohol, or use illegal drugs. Some items may interact with your medicine. What should I watch for while using this medicine? Visit your doctor or health care professional for regular check-ups. You may need regular tests to make sure your liver is  working properly. Tell your doctor or health care professional right away if you get any unexplained muscle pain, tenderness, or weakness,  especially if you also have a fever and tiredness. Your doctor or health care professional may tell you to stop taking this medicine if you develop muscle problems. If your muscle problems do not go away after stopping this medicine, contact your health care professional. This drug is only part of a total heart-health program. Your doctor or a dietician can suggest a low-cholesterol and low-fat diet to help. Avoid alcohol and smoking, and keep a proper exercise schedule. Do not use this drug if you are pregnant or breast-feeding. Serious side effects to an unborn child or to an infant are possible. Talk to your doctor or pharmacist for more information. This medicine may affect blood sugar levels. If you have diabetes, check with your doctor or health care professional before you change your diet or the dose of your diabetic medicine. If you are going to have surgery tell your health care professional that you are taking this drug. What side effects may I notice from receiving this medicine? Side effects that you should report to your doctor or health care professional as soon as possible: -allergic reactions like skin rash, itching or hives, swelling of the face, lips, or tongue -dark urine -fever -joint pain -muscle cramps, pain -redness, blistering, peeling or loosening of the skin, including inside the mouth -trouble passing urine or change in the amount of urine -unusually weak or tired -yellowing of eyes or skin Side effects that usually do not require medical attention (report to your doctor or health care professional if they continue or are bothersome): -constipation -heartburn -stomach gas, pain, upset This list may not describe all possible side effects. Call your doctor for medical advice about side effects. You may report side effects to FDA at 1-800-FDA-1088. Where should I keep my medicine? Keep out of the reach of children. Store at room temperature between 20 to 25 degrees C  (68 to 77 degrees F). Throw away any unused medicine after the expiration date. NOTE: This sheet is a summary. It may not cover all possible information. If you have questions about this medicine, talk to your doctor, pharmacist, or health care provider.  2018 Elsevier/Gold Standard (2011-10-30 51:10:21)

## 2017-08-13 ENCOUNTER — Encounter: Payer: Self-pay | Admitting: Family Medicine

## 2017-08-13 LAB — CBC WITH DIFFERENTIAL/PLATELET
BASOS ABS: 0 10*3/uL (ref 0.0–0.2)
Basos: 0 %
EOS (ABSOLUTE): 0.1 10*3/uL (ref 0.0–0.4)
EOS: 2 %
HEMATOCRIT: 42.1 % (ref 34.0–46.6)
HEMOGLOBIN: 13.8 g/dL (ref 11.1–15.9)
IMMATURE GRANS (ABS): 0 10*3/uL (ref 0.0–0.1)
IMMATURE GRANULOCYTES: 0 %
Lymphocytes Absolute: 1 10*3/uL (ref 0.7–3.1)
Lymphs: 23 %
MCH: 32.3 pg (ref 26.6–33.0)
MCHC: 32.8 g/dL (ref 31.5–35.7)
MCV: 99 fL — ABNORMAL HIGH (ref 79–97)
Monocytes Absolute: 0.4 10*3/uL (ref 0.1–0.9)
Monocytes: 10 %
NEUTROS PCT: 65 %
Neutrophils Absolute: 2.8 10*3/uL (ref 1.4–7.0)
Platelets: 252 10*3/uL (ref 150–379)
RBC: 4.27 x10E6/uL (ref 3.77–5.28)
RDW: 13.6 % (ref 12.3–15.4)
WBC: 4.2 10*3/uL (ref 3.4–10.8)

## 2017-08-13 LAB — CMP14+EGFR
ALBUMIN: 4.3 g/dL (ref 3.5–4.8)
ALK PHOS: 93 IU/L (ref 39–117)
ALT: 21 IU/L (ref 0–32)
AST: 22 IU/L (ref 0–40)
Albumin/Globulin Ratio: 1.7 (ref 1.2–2.2)
BUN / CREAT RATIO: 23 (ref 12–28)
BUN: 17 mg/dL (ref 8–27)
Bilirubin Total: 0.2 mg/dL (ref 0.0–1.2)
CALCIUM: 9.8 mg/dL (ref 8.7–10.3)
CO2: 27 mmol/L (ref 20–29)
CREATININE: 0.75 mg/dL (ref 0.57–1.00)
Chloride: 105 mmol/L (ref 96–106)
GFR calc Af Amer: 88 mL/min/{1.73_m2} (ref >59)
GFR, EST NON AFRICAN AMERICAN: 77 mL/min/{1.73_m2} (ref >59)
GLOBULIN, TOTAL: 2.6 g/dL (ref 1.5–4.5)
GLUCOSE: 79 mg/dL (ref 65–99)
Potassium: 5 mmol/L (ref 3.5–5.2)
SODIUM: 146 mmol/L — AB (ref 134–144)
Total Protein: 6.9 g/dL (ref 6.0–8.5)

## 2017-08-13 LAB — LDL CHOLESTEROL, DIRECT: LDL DIRECT: 158 mg/dL — AB (ref 0–99)

## 2017-08-13 LAB — VITAMIN D 25 HYDROXY (VIT D DEFICIENCY, FRACTURES): Vit D, 25-Hydroxy: 35.4 ng/mL (ref 30.0–100.0)

## 2017-08-13 LAB — TSH: TSH: 2.87 u[IU]/mL (ref 0.450–4.500)

## 2017-08-30 ENCOUNTER — Other Ambulatory Visit: Payer: Self-pay | Admitting: Family Medicine

## 2017-08-30 DIAGNOSIS — Z1231 Encounter for screening mammogram for malignant neoplasm of breast: Secondary | ICD-10-CM

## 2017-09-09 ENCOUNTER — Ambulatory Visit
Admission: RE | Admit: 2017-09-09 | Discharge: 2017-09-09 | Disposition: A | Discharge status: Home / Self Care | Payer: Medicare Other | Source: Ambulatory Visit | Attending: Internal Medicine | Admitting: Internal Medicine

## 2017-09-09 ENCOUNTER — Encounter: Payer: Self-pay | Admitting: Family Medicine

## 2017-09-09 DIAGNOSIS — Z1231 Encounter for screening mammogram for malignant neoplasm of breast: Secondary | ICD-10-CM | POA: Diagnosis not present

## 2017-09-16 NOTE — Progress Notes (Signed)
   Subjective:   Patient ID: Kristina Carlson    DOB: July 31, 1936, 81 y.o. female   MRN: [id number]  CC: "Medication refill"  HPI: Kristina Carlson is a 81 y.o. female who presents to clinic today medication refill. Problems discussed today are as follows:  Hypertension: Well-controlled with losartan. Patient is a nonsmoker. Requesting refill of losartan. ROS: Denies chest pain, shortness of breath, headache, change in vision.  Coronary artery disease: Tolerating atorvastatin without side effects for the past 1 month along with daily aspirin. ROS: Denies melena or hematochezia, myalgias, joint pain.  Complete ROS performed, see HPI for pertinent.  [place]: Non-obstructive diffuse CAD, HTN, HLD, osteopenia, hypothyroidism, GERD. Surgical LHC with coronary angio 2015. Family history stroke, DM. Smoking status reviewed. Medications reviewed.  Objective:   BP 134/84   Pulse 68   Temp 97.6 F (36.4 C) (Oral)   Ht 5\' 5"  (1.651 m)   Wt 171 lb (77.6 kg)   SpO2 97%   BMI 28.46 kg/m  Vitals and nursing note reviewed.  General: well nourished, well developed, in no acute distress with non-toxic appearance HEENT: normocephalic, atraumatic, moist mucous membranes CV: regular rate and rhythm without murmurs, rubs, or gallops, no lower extremity edema Lungs: clear to auscultation bilaterally with normal work of breathing Abdomen: soft, non-tender, non-distended, no masses or organomegaly palpable, normoactive bowel sounds Skin: warm, dry, no rashes or lesions, cap refill < 2 seconds Extremities: warm and well perfused, normal tone  Assessment & Plan:   Coronary artery disease Chronic. On high intensity statin and daily ASA.  --Continue atorvastatin 40 mg daily --LDL level in 1 month, will make adjustments to statin therapy based on results  Primary hypertension Chronic. Controlled. On ARB. --Refill given for Losartan 50 mg daily  Orders Placed This Encounter  Procedures  . LDL cholesterol,  direct    Standing Status:   Future    Standing Expiration Date:   09/17/2018   Meds ordered this encounter  Medications  . losartan (COZAAR) 50 MG tablet    Sig: Take 1 tablet (50 mg total) by mouth daily.    Dispense:  90 tablet    Refill:  [place], [place], PGY-2 09/17/2017 11:30 AM

## 2017-09-17 ENCOUNTER — Ambulatory Visit (INDEPENDENT_AMBULATORY_CARE_PROVIDER_SITE_OTHER): Payer: Medicare Other | Admitting: Family Medicine

## 2017-09-17 ENCOUNTER — Encounter: Payer: Self-pay | Admitting: Family Medicine

## 2017-09-17 VITALS — BP 134/84 | HR 68 | Temp 97.6°F | Ht 65.0 in | Wt 171.0 lb

## 2017-09-17 DIAGNOSIS — I10 Essential (primary) hypertension: Secondary | ICD-10-CM | POA: Diagnosis not present

## 2017-09-17 DIAGNOSIS — I2510 Atherosclerotic heart disease of native coronary artery without angina pectoris: Secondary | ICD-10-CM | POA: Diagnosis not present

## 2017-09-17 MED ORDER — LOSARTAN POTASSIUM 50 MG PO TABS: 50.0000 mg | ORAL_TABLET | Freq: Every day | ORAL | 3 refills | Status: DC

## 2017-09-17 NOTE — Patient Instructions (Addendum)
Thank you for coming in to see [place] today. Please see below to review our plan for today's visit.  1. Return to the clinic in one month to have your LDL (bad cholesterol) checked. You do not need to fast to have this blood test. I will mail you the results. 2. You still qualify for a wellness visit here at the clinic. I would like you to be seen in approximately 3 months by her RN named Lauren. 3. I have sent in a refill of your losartan. 4. Please fill out your portion of the disability parking place card and bring it back to the clinic.  Please call the clinic at [phone] if your symptoms worsen or you have any concerns. It was my pleasure to see you. -- Harriet Butte, [place] [facility], PGY-2

## 2017-09-17 NOTE — Assessment & Plan Note (Addendum)
Chronic. On high intensity statin and daily ASA.  --Continue atorvastatin 40 mg daily --LDL level in 1 month, will make adjustments to statin therapy based on results

## 2017-09-17 NOTE — Assessment & Plan Note (Addendum)
Chronic. Controlled. On ARB. --Refill given for Losartan 50 mg daily

## 2017-10-23 ENCOUNTER — Other Ambulatory Visit: Payer: Medicare Other

## 2017-10-23 ENCOUNTER — Telehealth: Payer: Self-pay | Admitting: Family Medicine

## 2017-10-23 DIAGNOSIS — I2510 Atherosclerotic heart disease of native coronary artery without angina pectoris: Secondary | ICD-10-CM | POA: Diagnosis not present

## 2017-10-23 NOTE — Telephone Encounter (Signed)
Reviewed, completed, and signed form.  Note routed to RN team inbasket and placed completed form in Clinic RN's office (wall pocket above desk).  Vineland Bing, DO

## 2017-10-23 NOTE — Telephone Encounter (Signed)
Form placed in PCP box 

## 2017-10-23 NOTE — Telephone Encounter (Signed)
[employer] Disability parking placard  form dropped off for at front desk for completion.  Verified that patient section of form has been completed.  Last DOS with PCP was 09/17/17  Placed form in Red team folder to be completed by clinical staff.  Carmina Miller

## 2017-10-24 ENCOUNTER — Encounter: Payer: Self-pay | Admitting: Family Medicine

## 2017-10-24 LAB — LDL CHOLESTEROL, DIRECT: LDL DIRECT: 67 mg/dL (ref 0–99)

## 2017-10-24 NOTE — Telephone Encounter (Signed)
Patient requesting form be mailed. Verified home address and form placed in to be mailed box. Hubbard Hartshorn, RN, BSN

## 2018-01-29 DIAGNOSIS — Z9842 Cataract extraction status, left eye: Secondary | ICD-10-CM | POA: Diagnosis not present

## 2018-01-29 DIAGNOSIS — H524 Presbyopia: Secondary | ICD-10-CM | POA: Diagnosis not present

## 2018-01-29 DIAGNOSIS — H353133 Nonexudative age-related macular degeneration, bilateral, advanced atrophic without subfoveal involvement: Secondary | ICD-10-CM | POA: Diagnosis not present

## 2018-01-29 DIAGNOSIS — Z9841 Cataract extraction status, right eye: Secondary | ICD-10-CM | POA: Diagnosis not present

## 2018-04-14 ENCOUNTER — Other Ambulatory Visit: Payer: Self-pay

## 2018-04-15 MED ORDER — LEVOTHYROXINE SODIUM 75 MCG PO TABS: 75.0000 ug | ORAL_TABLET | Freq: Every day | ORAL | 3 refills | Status: DC

## 2018-04-18 DIAGNOSIS — J209 Acute bronchitis, unspecified: Secondary | ICD-10-CM | POA: Diagnosis not present

## 2018-04-18 DIAGNOSIS — R05 Cough: Secondary | ICD-10-CM | POA: Diagnosis not present

## 2018-06-19 ENCOUNTER — Encounter: Payer: Self-pay | Admitting: Family Medicine

## 2018-06-19 ENCOUNTER — Other Ambulatory Visit: Payer: Self-pay

## 2018-06-19 ENCOUNTER — Ambulatory Visit (INDEPENDENT_AMBULATORY_CARE_PROVIDER_SITE_OTHER): Payer: Medicare Other | Admitting: Family Medicine

## 2018-06-19 VITALS — BP 122/78 | HR 68 | Temp 97.6°F | Ht 65.0 in | Wt 174.0 lb

## 2018-06-19 DIAGNOSIS — I10 Essential (primary) hypertension: Secondary | ICD-10-CM | POA: Diagnosis not present

## 2018-06-19 DIAGNOSIS — I2510 Atherosclerotic heart disease of native coronary artery without angina pectoris: Secondary | ICD-10-CM

## 2018-06-19 DIAGNOSIS — Z0000 Encounter for general adult medical examination without abnormal findings: Secondary | ICD-10-CM | POA: Diagnosis not present

## 2018-06-19 DIAGNOSIS — R202 Paresthesia of skin: Secondary | ICD-10-CM

## 2018-06-19 NOTE — Patient Instructions (Signed)
Thank you for coming in to see [place] today. Please see below to review our plan for today's visit.  1.  I will call you if your lab results are abnormal, otherwise expect to receive results in the mail. 2.  We will see you again in 1 year or sooner as needed.  Please call the clinic at [phone] if your symptoms worsen or you have any concerns. It was our pleasure to serve you.  Harriet Butte, [place], PGY-2

## 2018-06-19 NOTE — Assessment & Plan Note (Signed)
Chronic.  Has known diffuse CAD of native artery.  Remains asymptomatic.  On high intensity statin and daily aspirin.  Blood pressure well controlled. -Continue losartan-HCTZ 50-12.5 mg daily, aspirin 81 mg daily, Lipitor 40 mg daily

## 2018-06-19 NOTE — Assessment & Plan Note (Signed)
Health maintenance up-to-date.  Never smoker.  Blood pressure well controlled. - Checking CBC, CMET, LDL

## 2018-06-19 NOTE — Progress Notes (Signed)
Subjective   Patient ID: Kristina Carlson    DOB: October 28, 1936, 82 y.o. female   MRN: [id number]  CC: "Physical exam"  HPI: Kristina Carlson is a 82 y.o. female who presents to clinic today for the following:  Annual physical exam: Patient is here today for her annual Medicare physical exam.  She has endorsed some numbness on her anterior thighs but is otherwise without medical concerns.  She is a never smoker.  She is up-to-date on her vaccinations.  She has no health maintenance due today.  Patient denies fevers or chills, feelings of fatigue or syncope, chest pain, shortness of breath, palpitations, nausea or vomiting, abdominal pain, diarrhea or constipation, melena or hematochezia, dysuria, motor weakness.  Primary hypertension: Patient has long-standing hypertension.  She endorses adherence to losartan he does have history of cough allergy with ACE inhibitors.  Patient states she does not miss her doses.  Patient is on a daily aspirin.  Again, she is a never smoker.  Coronary artery disease: Patient with known diffuse nonobstructive CAD.  Has history of chest pain but well controlled as of recent.  Adherent to high intensity statin and daily aspirin.  Patient denies lower extremity pain with ambulation.  Anterior thigh numbness: Patient endorses intermittent bilateral anterior thigh numbness.  She denies associated numbness in other extremities.  This is been a gradual and long-standing issue over the last year.  She denies any motor strength weakness, facial droop, change in speech or swallowing.  Patient has no difficulty with ambulation.  She did endorse taking short-term course of PPI.  ROS: see HPI for pertinent.  [place]: CAD, HTN, HLD, osteopenia, hypothyroidism, GERD. Surgical history LHC with coronary angio 2015. Family history stroke, DM. Smoking status reviewed. Medications reviewed.  Objective   BP 122/78   Pulse 68   Temp 97.6 F (36.4 C) (Oral)   Ht '5\' 5"'  (1.651 m)   Wt 174 lb  (78.9 kg)   SpO2 98%   BMI 28.96 kg/m  Vitals and nursing note reviewed.  General: elderly female, well nourished, well developed, NAD with non-toxic appearance HEENT: normocephalic, atraumatic, moist mucous membranes Neck: supple, non-tender without lymphadenopathy Cardiovascular: regular rate and rhythm without murmurs, rubs, or gallops Lungs: clear to auscultation bilaterally with normal work of breathing Abdomen: soft, non-tender, non-distended, normoactive bowel sounds Skin: warm, dry, no rashes or lesions, cap refill < 2 seconds Extremities: warm and well perfused, normal tone, no edema, 5/5 motor strength in all 4 extremities Neuro: moves all 4 extremities, no facial droop or dysarthria  Assessment & Plan   Encounter for annual physical exam Health maintenance up-to-date.  Never smoker.  Blood pressure well controlled. - Checking CBC, CMET, LDL  Coronary artery disease Chronic.  Has known diffuse CAD of native artery.  Remains asymptomatic.  On high intensity statin and daily aspirin.  Blood pressure well controlled. -Continue losartan-HCTZ 50-12.5 mg daily, aspirin 81 mg daily, Lipitor 40 mg daily  Primary hypertension Chronic.  Well-controlled.  Never smoker. - Checking CBC, CMET, lipid panel, TSH - Continue losartan-HCTZ 50 mg - 12.5 mg daily  Paresthesia of bilateral legs Chronic.  Intermittent episodes of anterior thigh only.  No associated weakness.  Less likely CNS involvement.  Could be related to superficial femoral nerve impingement.  Patient without paresthesia at present. - Checking electrolytes and B12 - Reviewed return precautions  Orders Placed This Encounter  Procedures  . CBC  . CMP14+EGFR  . TSH  . Lipid panel  .  Vitamin B12   No orders of the defined types were placed in this encounter.   Harriet Butte, [place], PGY-2 06/19/2018, 12:13 PM

## 2018-06-19 NOTE — Assessment & Plan Note (Addendum)
Chronic.  Well-controlled.  Never smoker. - Checking CBC, CMET, lipid panel, TSH - Continue losartan-HCTZ 50 mg - 12.5 mg daily

## 2018-06-19 NOTE — Assessment & Plan Note (Signed)
Chronic.  Intermittent episodes of anterior thigh only.  No associated weakness.  Less likely CNS involvement.  Could be related to superficial femoral nerve impingement.  Patient without paresthesia at present. - Checking electrolytes and B12 - Reviewed return precautions

## 2018-06-20 ENCOUNTER — Other Ambulatory Visit: Payer: Self-pay | Admitting: Family Medicine

## 2018-06-20 DIAGNOSIS — E538 Deficiency of other specified B group vitamins: Secondary | ICD-10-CM

## 2018-06-20 LAB — CBC
Hematocrit: 43.1 % (ref 34.0–46.6)
Hemoglobin: 14.4 g/dL (ref 11.1–15.9)
MCH: 32.4 pg (ref 26.6–33.0)
MCHC: 33.4 g/dL (ref 31.5–35.7)
MCV: 97 fL (ref 79–97)
PLATELETS: 240 10*3/uL (ref 150–450)
RBC: 4.44 x10E6/uL (ref 3.77–5.28)
RDW: 14.2 % (ref 12.3–15.4)
WBC: 3.5 10*3/uL (ref 3.4–10.8)

## 2018-06-20 LAB — VITAMIN B12: VITAMIN B 12: 152 pg/mL — AB (ref 232–1245)

## 2018-06-20 LAB — CMP14+EGFR
ALBUMIN: 4.3 g/dL (ref 3.5–4.8)
ALT: 20 IU/L (ref 0–32)
AST: 16 IU/L (ref 0–40)
Albumin/Globulin Ratio: 1.8 (ref 1.2–2.2)
Alkaline Phosphatase: 89 IU/L (ref 39–117)
BUN / CREAT RATIO: 16 (ref 12–28)
BUN: 15 mg/dL (ref 8–27)
Bilirubin Total: 0.7 mg/dL (ref 0.0–1.2)
CALCIUM: 9.7 mg/dL (ref 8.7–10.3)
CO2: 26 mmol/L (ref 20–29)
CREATININE: 0.93 mg/dL (ref 0.57–1.00)
Chloride: 104 mmol/L (ref 96–106)
GFR, EST AFRICAN AMERICAN: 68 mL/min/{1.73_m2} (ref >59)
GFR, EST NON AFRICAN AMERICAN: 59 mL/min/{1.73_m2} — AB (ref >59)
GLUCOSE: 99 mg/dL (ref 65–99)
Globulin, Total: 2.4 g/dL (ref 1.5–4.5)
Potassium: 4.7 mmol/L (ref 3.5–5.2)
Sodium: 145 mmol/L — ABNORMAL HIGH (ref 134–144)
TOTAL PROTEIN: 6.7 g/dL (ref 6.0–8.5)

## 2018-06-20 LAB — LIPID PANEL
CHOL/HDL RATIO: 3.7 ratio (ref 0.0–4.4)
Cholesterol, Total: 133 mg/dL (ref 100–199)
HDL: 36 mg/dL — AB (ref >39)
LDL CALC: 77 mg/dL (ref 0–99)
TRIGLYCERIDES: 102 mg/dL (ref 0–149)
VLDL CHOLESTEROL CAL: 20 mg/dL (ref 5–40)

## 2018-06-20 LAB — TSH: TSH: 1.53 u[IU]/mL (ref 0.450–4.500)

## 2018-06-20 MED ORDER — VITAMIN B-12 1000 MCG PO TABS: 1000.0000 ug | ORAL_TABLET | Freq: Every day | ORAL | 3 refills | Status: DC

## 2018-07-08 ENCOUNTER — Other Ambulatory Visit: Payer: Self-pay | Admitting: Family Medicine

## 2018-07-08 ENCOUNTER — Ambulatory Visit
Admission: RE | Admit: 2018-07-08 | Discharge: 2018-07-08 | Disposition: A | Discharge status: Home / Self Care | Payer: Medicare Other | Source: Ambulatory Visit | Attending: Family Medicine | Admitting: Family Medicine

## 2018-07-08 DIAGNOSIS — M79604 Pain in right leg: Secondary | ICD-10-CM | POA: Diagnosis not present

## 2018-07-08 DIAGNOSIS — M79661 Pain in right lower leg: Secondary | ICD-10-CM | POA: Diagnosis not present

## 2018-07-08 DIAGNOSIS — M7121 Synovial cyst of popliteal space [Baker], right knee: Secondary | ICD-10-CM | POA: Diagnosis not present

## 2018-07-18 ENCOUNTER — Other Ambulatory Visit: Payer: Self-pay | Admitting: Family Medicine

## 2018-07-18 DIAGNOSIS — I2510 Atherosclerotic heart disease of native coronary artery without angina pectoris: Secondary | ICD-10-CM

## 2018-08-05 DIAGNOSIS — H353133 Nonexudative age-related macular degeneration, bilateral, advanced atrophic without subfoveal involvement: Secondary | ICD-10-CM | POA: Diagnosis not present

## 2018-08-12 ENCOUNTER — Other Ambulatory Visit: Payer: Self-pay | Admitting: Family Medicine

## 2018-08-12 DIAGNOSIS — Z1231 Encounter for screening mammogram for malignant neoplasm of breast: Secondary | ICD-10-CM

## 2018-09-17 ENCOUNTER — Other Ambulatory Visit: Payer: Self-pay | Admitting: Family Medicine

## 2018-09-17 ENCOUNTER — Ambulatory Visit
Admission: RE | Admit: 2018-09-17 | Discharge: 2018-09-17 | Disposition: A | Discharge status: Home / Self Care | Payer: Medicare Other | Source: Ambulatory Visit | Attending: Nurse Practitioner | Admitting: Nurse Practitioner

## 2018-09-17 DIAGNOSIS — Z1231 Encounter for screening mammogram for malignant neoplasm of breast: Secondary | ICD-10-CM

## 2018-09-17 DIAGNOSIS — I10 Essential (primary) hypertension: Secondary | ICD-10-CM

## 2018-09-22 ENCOUNTER — Ambulatory Visit (INDEPENDENT_AMBULATORY_CARE_PROVIDER_SITE_OTHER): Payer: Medicare Other | Admitting: Family Medicine

## 2018-09-22 ENCOUNTER — Encounter: Payer: Self-pay | Admitting: Family Medicine

## 2018-09-22 VITALS — BP 125/80 | HR 89 | Temp 97.6°F | Wt 178.4 lb

## 2018-09-22 DIAGNOSIS — R202 Paresthesia of skin: Secondary | ICD-10-CM | POA: Diagnosis not present

## 2018-09-22 DIAGNOSIS — E538 Deficiency of other specified B group vitamins: Secondary | ICD-10-CM | POA: Diagnosis not present

## 2018-09-22 NOTE — Assessment & Plan Note (Signed)
Recent finding.  Currently tolerating cyanocobalamin.  Questionable symptomatic peripheral neuropathy on anterior thighs, otherwise asymptomatic.  Weight appears to be stable, in fact increasing over the last several years.  No concerns for associated memory loss.  Blood work appeared normal during last visit in June. - Rechecking B12 level  - Continue cyanocobalamin 1000 mcg daily

## 2018-09-22 NOTE — Assessment & Plan Note (Signed)
Chronic.  Improved.  Unsure if this is related to vitamin B12 deficiency given improvement with use of supplements.  Does seem more consistent with impingement of superficial femoral cutaneous nerve.  No red flags. - Reviewed return precautions - See plan for B12 deficiency - RTC 1 year or sooner if needed

## 2018-09-22 NOTE — Progress Notes (Signed)
   Subjective   Patient ID: Kristina Carlson    DOB: October 20, 1936, 82 y.o. female   MRN: [id number]  CC: "B12"  HPI: Kristina Carlson is a 82 y.o. female who presents to clinic today for the following:  B12 deficiency: Patient is here today for follow-up regarding B12 deficiency during annual physical back in June.  This was checked in the context of her unexplained bilateral anterior thigh numbness.  He has no prior history of B12 deficiency.  She has been taking 1000 mcg of cyanocobalamin daily.  She denies symptoms of fatigue or syncope, tongue pain, nausea or vomiting, abdominal pain, memory loss.  She has no history of anemia or increased MCV based on her recent blood work in June.  Anterior thigh numbness: Patient reports improvement of her numbness localized to the anterior portion of her thighs bilaterally since starting cyanocobalamin since being diagnosed with B12 deficiency back in June.  She does occasionally have some numbness but this is much better than her last visit.  She has no weakness in her legs and does not have difficulty with ambulation.  She has no other associated numbness.  ROS: see HPI for pertinent.  [place]: CAD, HTN, HLD, osteopenia, hypothyroidism, GERD.Surgical history LHC with coronary angio 2015. Family historystroke,DM. Smoking status reviewed. Medications reviewed.  Objective   BP 125/80 (BP Location: Right Arm, Patient Position: Sitting, Cuff Size: Normal)   Pulse 89   Temp 97.6 F (36.4 C) (Oral)   Wt 178 lb 6.4 oz (80.9 kg)   SpO2 95%   BMI 29.69 kg/m  Vitals and nursing note reviewed.  General: well nourished, well developed, NAD with non-toxic appearance HEENT: normocephalic, atraumatic, moist mucous membranes Cardiovascular: regular rate and rhythm without murmurs, rubs, or gallops Lungs: clear to auscultation bilaterally with normal work of breathing Abdomen: soft, non-tender, non-distended, normoactive bowel sounds Skin: warm, dry, no rashes or  lesions, cap refill < 2 seconds Extremities: warm and well perfused, normal tone, no edema Neuro: grossly intact throughout, no dysarthria Psych: euthymic mood, congruent affect  Assessment & Plan   Paresthesia of bilateral legs Chronic.  Improved.  Unsure if this is related to vitamin B12 deficiency given improvement with use of supplements.  Does seem more consistent with impingement of superficial femoral cutaneous nerve.  No red flags. - Reviewed return precautions - See plan for B12 deficiency - RTC 1 year or sooner if needed  B12 deficiency Recent finding.  Currently tolerating cyanocobalamin.  Questionable symptomatic peripheral neuropathy on anterior thighs, otherwise asymptomatic.  Weight appears to be stable, in fact increasing over the last several years.  No concerns for associated memory loss.  Blood work appeared normal during last visit in June. - Rechecking B12 level  - Continue cyanocobalamin 1000 mcg daily  Orders Placed This Encounter  Procedures  . Vitamin B12   No orders of the defined types were placed in this encounter.   Harriet Butte, [place], PGY-3 09/22/2018, 2:14 PM

## 2018-09-22 NOTE — Patient Instructions (Signed)
Thank you for coming in to see [place] today. Please see below to review our plan for today's visit.  I will call you if your B12 is still low. See you in 1 year.  Please call the clinic at [phone] if your symptoms worsen or you have any concerns. It was our pleasure to serve you.  Harriet Butte, [place], PGY-3

## 2018-09-23 ENCOUNTER — Encounter: Payer: Self-pay | Admitting: Family Medicine

## 2018-09-23 LAB — VITAMIN B12: Vitamin B-12: 985 pg/mL (ref 232–1245)

## 2018-12-23 IMAGING — MG 2D DIGITAL SCREENING BILATERAL MAMMOGRAM WITH CAD AND ADJUNCT TO
8 of 12 series · 8 of 28 positions shown · non-contrast
Comparison: Previous exam(s).

CLINICAL DATA: Screening.

EXAM:
2D DIGITAL SCREENING BILATERAL MAMMOGRAM WITH CAD AND ADJUNCT TOMO

[R CC synth-2D]
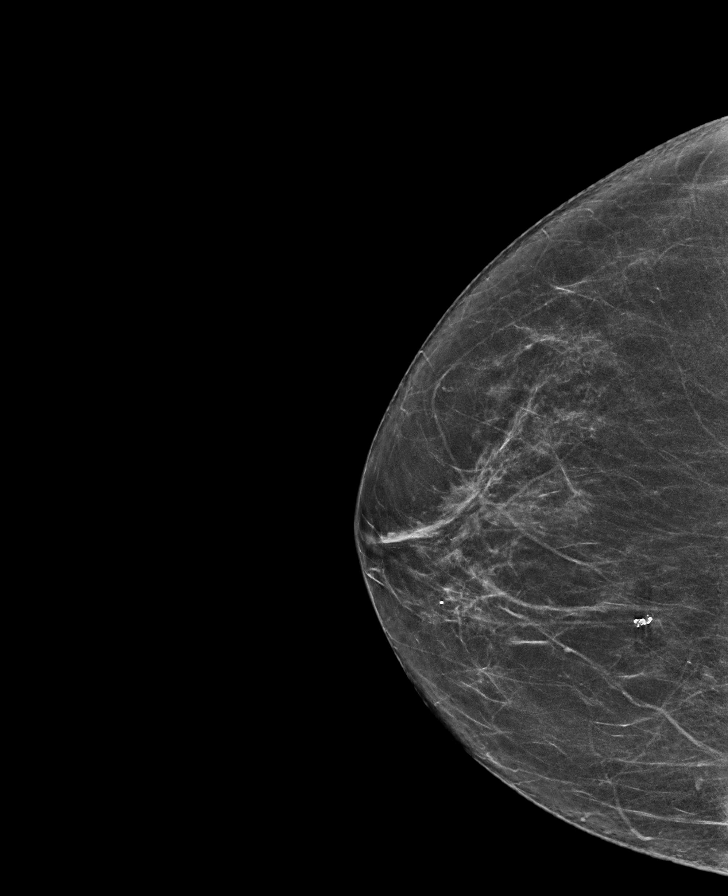

[L CC synth-2D]
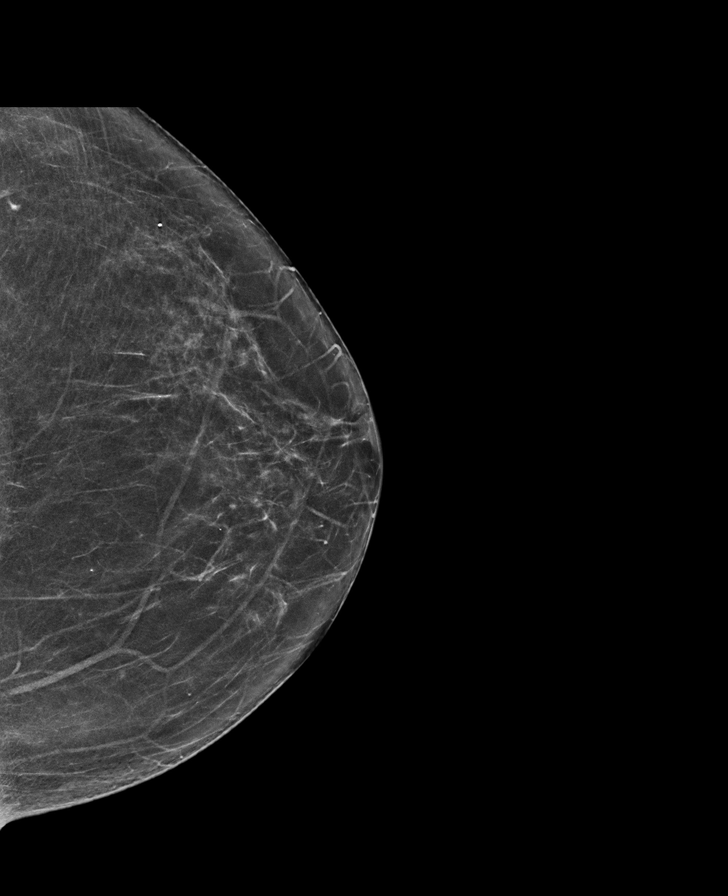

[L CC]
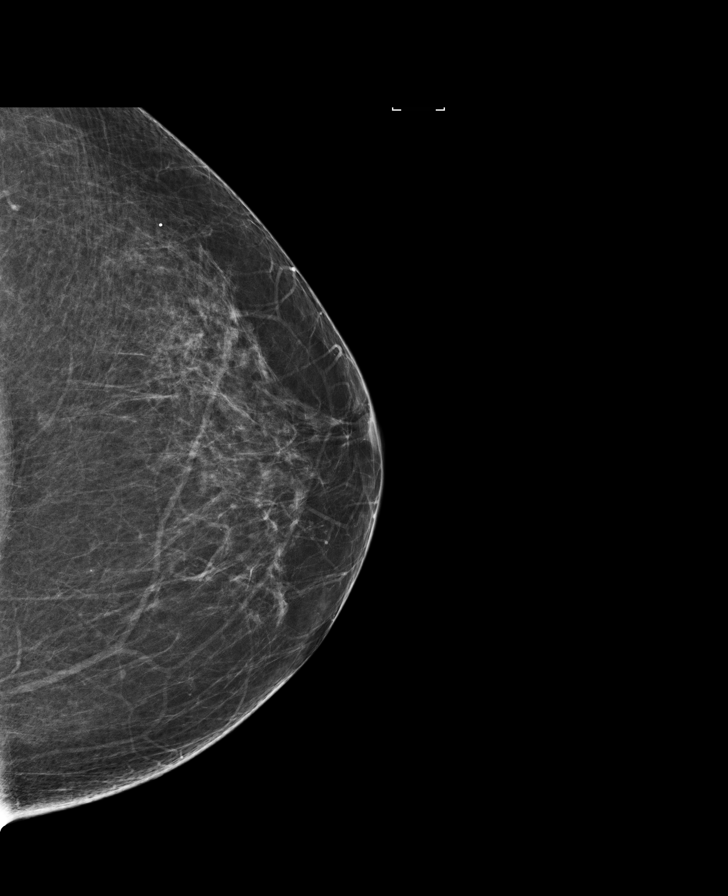

[L MLO]
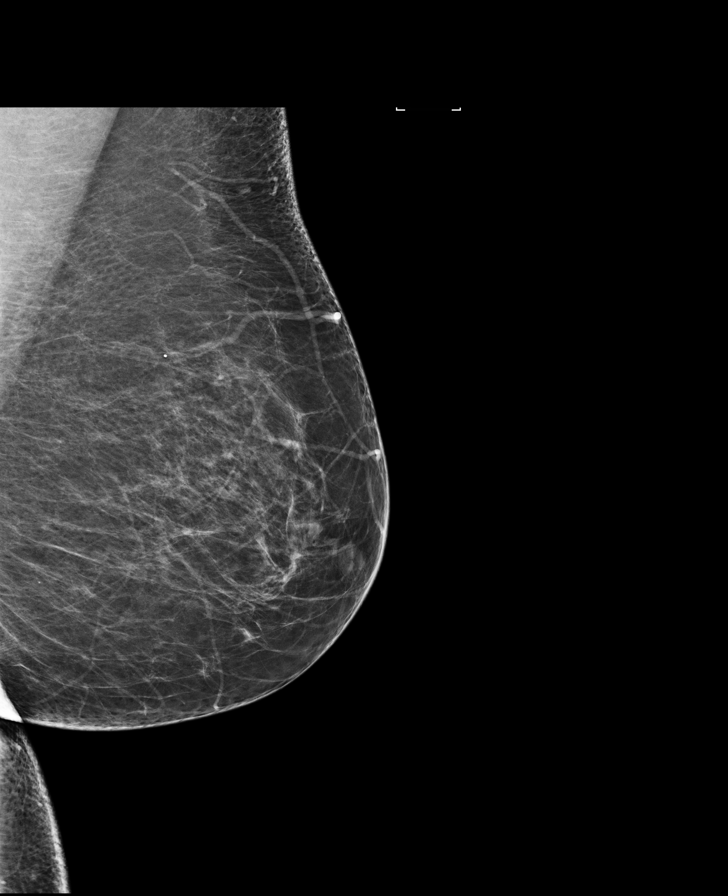

[R MLO]
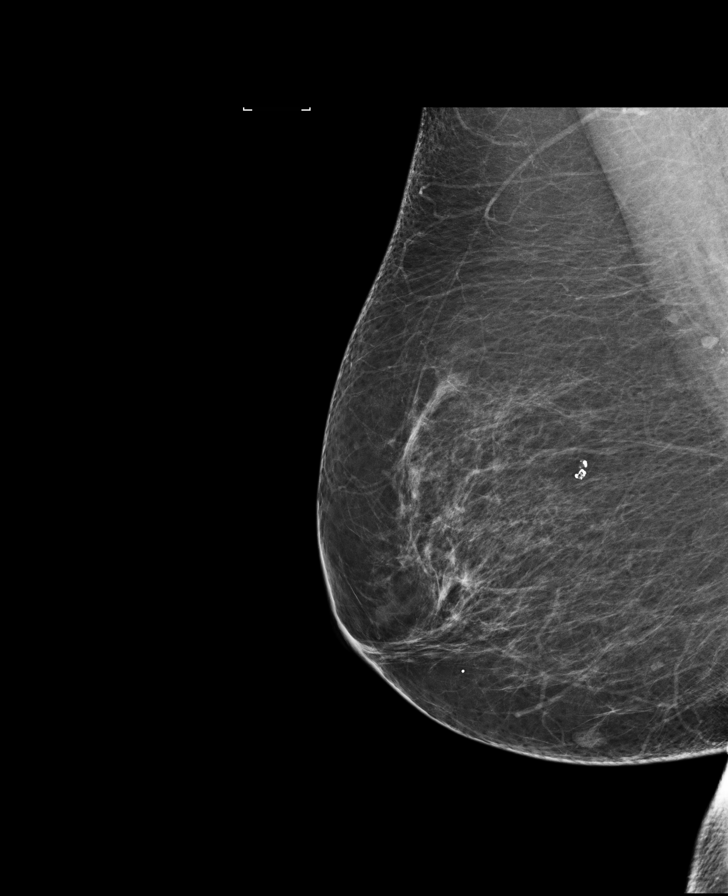

[L MLO synth-2D]
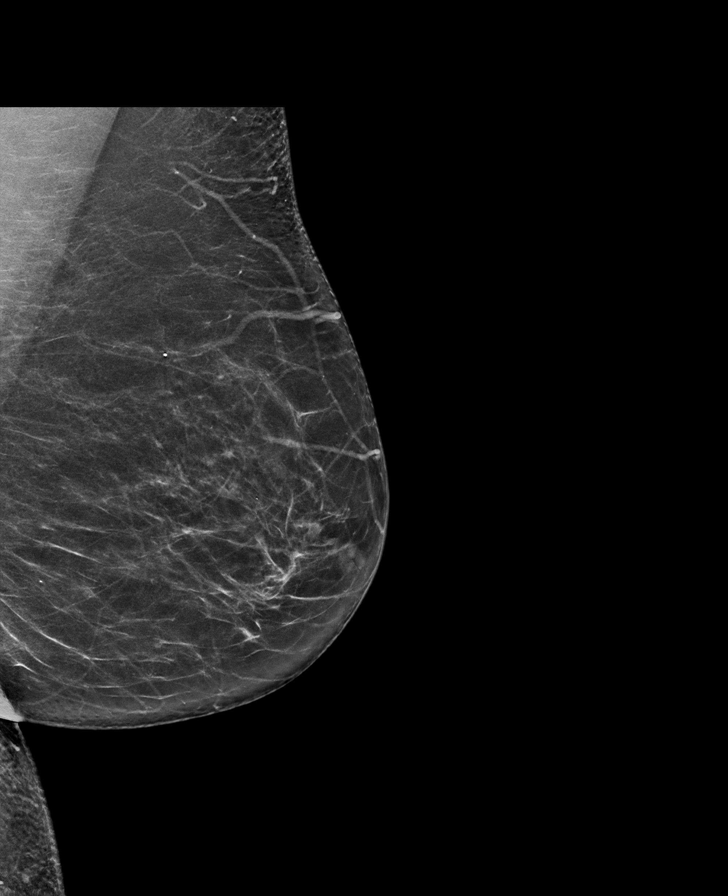

[R CC]
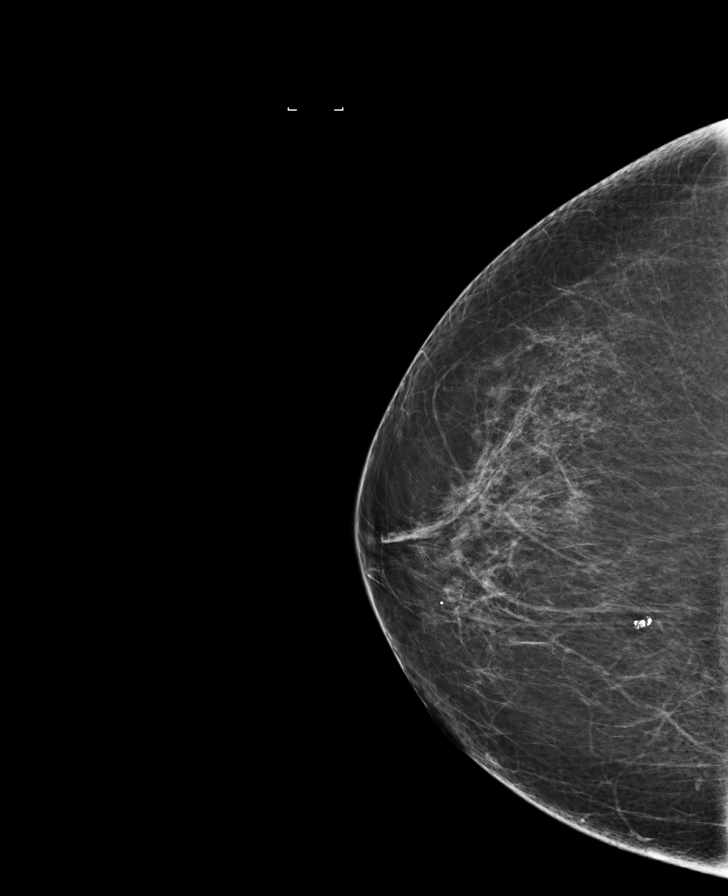

[R MLO synth-2D]
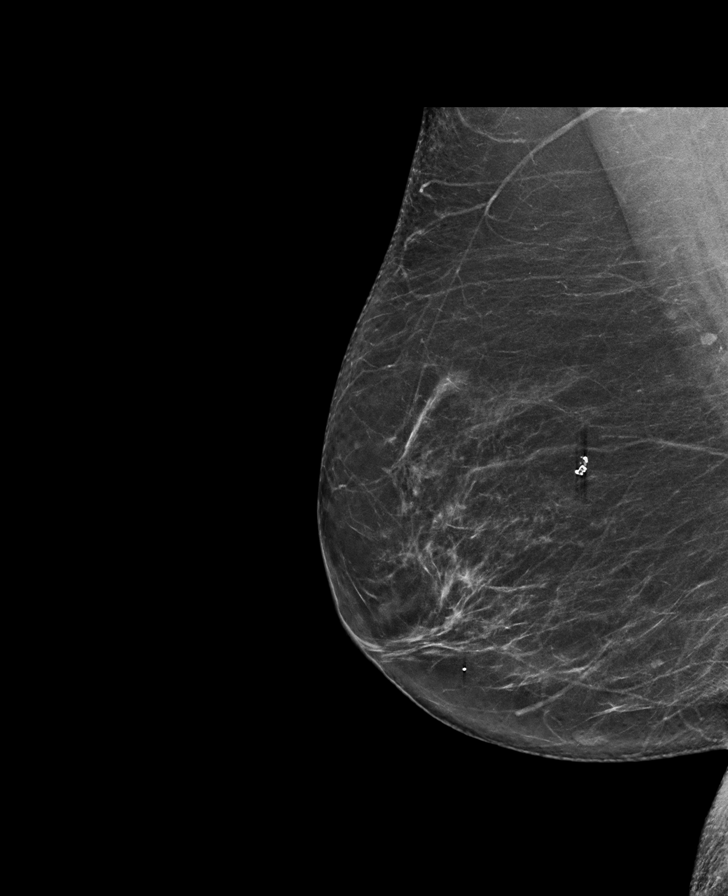

[8 of 28 positions shown; findings below may reference images not displayed]

ACR Breast Density Category b: There are scattered areas of
fibroglandular density.
FINDINGS: There are no findings suspicious for malignancy. Images were
processed with CAD.
IMPRESSION: No mammographic evidence of malignancy. A result letter of this
screening mammogram will be mailed directly to the patient.

RECOMMENDATION:
Screening mammogram in one year. (Code:97-6-RS4)

BI-RADS CATEGORY  1: Negative.

## 2019-02-04 DIAGNOSIS — H353133 Nonexudative age-related macular degeneration, bilateral, advanced atrophic without subfoveal involvement: Secondary | ICD-10-CM | POA: Diagnosis not present

## 2019-02-04 DIAGNOSIS — H0288A Meibomian gland dysfunction right eye, upper and lower eyelids: Secondary | ICD-10-CM | POA: Diagnosis not present

## 2019-02-04 DIAGNOSIS — H52223 Regular astigmatism, bilateral: Secondary | ICD-10-CM | POA: Diagnosis not present

## 2019-02-04 DIAGNOSIS — Z9841 Cataract extraction status, right eye: Secondary | ICD-10-CM | POA: Diagnosis not present

## 2019-02-04 DIAGNOSIS — H0288B Meibomian gland dysfunction left eye, upper and lower eyelids: Secondary | ICD-10-CM | POA: Diagnosis not present

## 2019-02-04 DIAGNOSIS — Z9842 Cataract extraction status, left eye: Secondary | ICD-10-CM | POA: Diagnosis not present

## 2019-04-09 DIAGNOSIS — R05 Cough: Secondary | ICD-10-CM | POA: Diagnosis not present

## 2019-04-09 DIAGNOSIS — R0982 Postnasal drip: Secondary | ICD-10-CM | POA: Diagnosis not present

## 2019-04-09 DIAGNOSIS — J209 Acute bronchitis, unspecified: Secondary | ICD-10-CM | POA: Diagnosis not present

## 2019-04-13 ENCOUNTER — Other Ambulatory Visit: Payer: Self-pay | Admitting: Family Medicine

## 2019-04-15 ENCOUNTER — Emergency Department (HOSPITAL_COMMUNITY)
Admission: EM | Admit: 2019-04-15 | Discharge: 2019-04-15 | Disposition: A | Discharge status: Home / Self Care | Payer: Medicare Other | Attending: Emergency Medicine | Admitting: Emergency Medicine

## 2019-04-15 ENCOUNTER — Other Ambulatory Visit: Payer: Self-pay

## 2019-04-15 ENCOUNTER — Encounter (HOSPITAL_COMMUNITY): Payer: Self-pay | Admitting: Emergency Medicine

## 2019-04-15 ENCOUNTER — Telehealth: Payer: Self-pay | Admitting: Interventional Cardiology

## 2019-04-15 ENCOUNTER — Emergency Department (HOSPITAL_COMMUNITY): Payer: Medicare Other

## 2019-04-15 DIAGNOSIS — R079 Chest pain, unspecified: Secondary | ICD-10-CM

## 2019-04-15 DIAGNOSIS — R0789 Other chest pain: Secondary | ICD-10-CM | POA: Insufficient documentation

## 2019-04-15 DIAGNOSIS — I2510 Atherosclerotic heart disease of native coronary artery without angina pectoris: Secondary | ICD-10-CM | POA: Insufficient documentation

## 2019-04-15 DIAGNOSIS — E039 Hypothyroidism, unspecified: Secondary | ICD-10-CM | POA: Diagnosis not present

## 2019-04-15 DIAGNOSIS — Z79899 Other long term (current) drug therapy: Secondary | ICD-10-CM | POA: Insufficient documentation

## 2019-04-15 DIAGNOSIS — I10 Essential (primary) hypertension: Secondary | ICD-10-CM | POA: Diagnosis not present

## 2019-04-15 DIAGNOSIS — Z7722 Contact with and (suspected) exposure to environmental tobacco smoke (acute) (chronic): Secondary | ICD-10-CM | POA: Diagnosis not present

## 2019-04-15 LAB — CBC WITH DIFFERENTIAL/PLATELET
Abs Immature Granulocytes: 0.01 10*3/uL (ref 0.00–0.07)
Basophils Absolute: 0 10*3/uL (ref 0.0–0.1)
Basophils Relative: 0 %
Eosinophils Absolute: 0.1 10*3/uL (ref 0.0–0.5)
Eosinophils Relative: 2 %
HCT: 44.4 % (ref 36.0–46.0)
Hemoglobin: 14.6 g/dL (ref 12.0–15.0)
Immature Granulocytes: 0 %
Lymphocytes Relative: 23 %
Lymphs Abs: 1 10*3/uL (ref 0.7–4.0)
MCH: 32.4 pg (ref 26.0–34.0)
MCHC: 32.9 g/dL (ref 30.0–36.0)
MCV: 98.7 fL (ref 80.0–100.0)
Monocytes Absolute: 0.3 10*3/uL (ref 0.1–1.0)
Monocytes Relative: 8 %
Neutro Abs: 3 10*3/uL (ref 1.7–7.7)
Neutrophils Relative %: 67 %
Platelets: 206 10*3/uL (ref 150–400)
RBC: 4.5 MIL/uL (ref 3.87–5.11)
RDW: 13 % (ref 11.5–15.5)
WBC: 4.5 10*3/uL (ref 4.0–10.5)
nRBC: 0 % (ref 0.0–0.2)

## 2019-04-15 LAB — COMPREHENSIVE METABOLIC PANEL
ALT: 37 U/L (ref 0–44)
AST: 25 U/L (ref 15–41)
Albumin: 3.9 g/dL (ref 3.5–5.0)
Alkaline Phosphatase: 79 U/L (ref 38–126)
Anion gap: 12 (ref 5–15)
BUN: 13 mg/dL (ref 8–23)
CO2: 27 mmol/L (ref 22–32)
Calcium: 9.8 mg/dL (ref 8.9–10.3)
Chloride: 104 mmol/L (ref 98–111)
Creatinine, Ser: 0.85 mg/dL (ref 0.44–1.00)
GFR calc Af Amer: >60 mL/min (ref >60)
GFR calc non Af Amer: >60 mL/min (ref >60)
Glucose, Bld: 96 mg/dL (ref 70–99)
Potassium: 4.2 mmol/L (ref 3.5–5.1)
Sodium: 143 mmol/L (ref 135–145)
Total Bilirubin: 0.8 mg/dL (ref 0.3–1.2)
Total Protein: 6.7 g/dL (ref 6.5–8.1)

## 2019-04-15 LAB — TROPONIN I
Troponin I: <0.03 ng/mL (ref <0.03)
Troponin I: <0.03 ng/mL (ref <0.03)

## 2019-04-15 MED ORDER — PANTOPRAZOLE SODIUM 20 MG PO TBEC: 20.0000 mg | DELAYED_RELEASE_TABLET | Freq: Every day | ORAL | 0 refills | Status: DC

## 2019-04-15 NOTE — Telephone Encounter (Signed)
New Message:   Please have Dr Tamala Julian to call Annett Fabian, PA. She wants Dr Tamala Julian to actually see a pt in the office. Dr Tamala Julian did a Cath on her in 2015. She have not seen him or any Cardiologist in the office.

## 2019-04-15 NOTE — ED Provider Notes (Signed)
[facility] EMERGENCY DEPARTMENT Provider Note   CSN: [id number] Arrival date & time: 04/15/19  1142    History   Chief Complaint Chief Complaint  Patient presents with  . Chest Pain    HPI Kristina Carlson is a 83 y.o. female.     HPI  Patient is an 83 yo female with a PMH of HLD, HTN, cardiac catheterization in 2015 showing nonobstructive CAD, with greatest lesion 50% stenosis of LAD presenting for left sided chest pain. Patient was referred from UC. Patient reports she was treated last week for bronchitis out of UC with amoxicillin. She reports that she has had some leftsided nonradiating chest discomfort that mostly affects her at night for approximately a week, but it seemed to worsen today. She reports that the chest pain is mild on my evaluation here. Patient reports that the cough is improving. She has some phlegm in her throat, but denies any productive cough or hemoptysis.  Patient denies any exertional chest pain.  She denies any lower extremity edema or calf tenderness.  Patient denies any history DVT/PE, hormone use, cancer treatment, recent mobilization, hospitalization, or recent surgery.  Past Medical History:  Diagnosis Date  . Hyperlipidemia   . Hypertension   . Hypothyroid   . Insomnia   . Macular degeneration    -- Dr. Laurey Arrow (optometrist).  Taking Ocuvite.    Patient Active Problem List   Diagnosis Date Noted  . B12 deficiency 09/22/2018  . Paresthesia of bilateral legs 06/19/2018  . GERD (gastroesophageal reflux disease) 06/20/2016  . Osteopenia 06/20/2016  . Coronary artery disease 06/30/2014  . Primary hypertension 09/01/2012  . Vitamin D deficiency, unspecified 08/24/2010  . Familial multiple lipoprotein-type hyperlipidemia 03/08/2010  . Hypothyroidism 02/20/2007    Past Surgical History:  Procedure Laterality Date  . LEFT HEART CATHETERIZATION WITH CORONARY ANGIOGRAM N/A 06/29/2014   Procedure: LEFT HEART CATHETERIZATION  WITH CORONARY ANGIOGRAM;  Surgeon: Sinclair Grooms, MD;  Location: [facility] CATH LAB;  Service: Cardiovascular;  Laterality: N/A;     OB History   No obstetric history on file.      Home Medications    Prior to Admission medications   Medication Sig Start Date End Date Taking? Authorizing Provider  amoxicillin-clavulanate (AUGMENTIN) 875-125 MG tablet Take 1 tablet by mouth every 12 (twelve) hours. 04/09/19  Yes [provider]  aspirin 81 MG EC tablet TAKE 1 TABLET (81 MG TOTAL) BY MOUTH DAILY. SWALLOW WHOLE. Patient taking differently: Take 81 mg by mouth daily.  06/04/17  Yes Archie Patten, MD  atorvastatin (LIPITOR) 40 MG tablet TAKE 1 TABLET BY MOUTH EVERY DAY Patient taking differently: Take 40 mg by mouth daily at 6 PM.  07/18/18  Yes Diallo, Abdoulaye, MD  Calcium Carb-Cholecalciferol (CALCIUM 600+D3) 600-200 MG-UNIT TABS Take 1 tablet by mouth daily.    Yes [provider]  levothyroxine (SYNTHROID) 75 MCG tablet TAKE 1 TABLET BY MOUTH DAILY BEFORE BREAKFAST. TAKE 1 TABLET (75 MCG TOTAL) BY MOUTH DAILY. Patient taking differently: Take 75 mcg by mouth daily before breakfast.  04/13/19  Yes [place] Bing, DO  losartan (COZAAR) 50 MG tablet TAKE 1 TABLET BY MOUTH EVERY DAY Patient taking differently: Take 50 mg by mouth daily.  09/17/18  Yes [place] Bing, DO  Multiple Vitamins-Minerals (OCUVITE PRESERVISION) TABS Take 1 tablet by mouth 2 (two) times a day.    Yes [provider]  vitamin B-12 (CYANOCOBALAMIN) 1000 MCG tablet Take 1 tablet (  1,000 mcg total) by mouth daily. 06/20/18  Yes [place] Bing, DO    Family History Family History  Problem Relation Age of Onset  . Diabetes Brother   . Stroke Father     Social History Social History   Tobacco Use  . Smoking status: Passive Smoke Exposure - Never Smoker  . Smokeless tobacco: Never Used  Substance Use Topics  . Alcohol use: No  . Drug use: No     Allergies   Lisinopril    Review of Systems Review of Systems  Constitutional: Negative for chills and fever.  HENT: Negative for congestion and sore throat.   Eyes: Negative for visual disturbance.  Respiratory: Negative for cough, chest tightness and shortness of breath.   Cardiovascular: Positive for chest pain. Negative for palpitations and leg swelling.  Gastrointestinal: Negative for abdominal pain, nausea and vomiting.  Genitourinary: Negative for dysuria.  Musculoskeletal: Negative for back pain and myalgias.  Skin: Negative for rash.  Neurological: Negative for dizziness, syncope, light-headedness and headaches.     Physical Exam Updated Vital Signs BP (!) 158/83   Pulse 96   Temp 98.5 F (36.9 C) (Oral)   Resp 20   Ht 5\' 5"  (1.651 m)   Wt 78 kg   SpO2 97%   BMI 28.62 kg/m   Physical Exam Vitals signs and nursing note reviewed.  Constitutional:      General: She is not in acute distress.    Appearance: She is well-developed.  HENT:     Head: Normocephalic and atraumatic.  Eyes:     Conjunctiva/sclera: Conjunctivae normal.     Pupils: Pupils are equal, round, and reactive to light.  Neck:     Musculoskeletal: Normal range of motion and neck supple.  Cardiovascular:     Rate and Rhythm: Normal rate and regular rhythm.     Pulses:          Radial pulses are 2+ on the right side and 2+ on the left side.     Heart sounds: S1 normal and S2 normal. No murmur.     Comments: No LE edema or calf tenderness.  Pulmonary:     Effort: Pulmonary effort is normal.     Breath sounds: Normal breath sounds. No wheezing or rales.  Abdominal:     General: There is no distension.     Palpations: Abdomen is soft.     Tenderness: There is no abdominal tenderness. There is no guarding.  Musculoskeletal: Normal range of motion.        General: No deformity.  Lymphadenopathy:     Cervical: No cervical adenopathy.  Skin:    General: Skin is warm and dry.     Findings: No erythema or rash.   Neurological:     Mental Status: She is alert.     Comments: Cranial nerves grossly intact. Patient moves extremities symmetrically and with good coordination.  Psychiatric:        Behavior: Behavior normal.        Thought Content: Thought content normal.        Judgment: Judgment normal.      ED Treatments / Results  Labs (all labs ordered are listed, but only abnormal results are displayed) Labs Reviewed  COMPREHENSIVE METABOLIC PANEL  CBC WITH DIFFERENTIAL/PLATELET  TROPONIN I  TROPONIN I    EKG EKG Interpretation  Date/Time:  Wednesday April 15 2019 11:55:24 EDT Ventricular Rate:  85 PR Interval:    QRS Duration: 80 QT  Interval:  363 QTC Calculation: 432 R Axis:   19 Text Interpretation:  Sinus rhythm Low voltage, precordial leads unremarkable ecg Confirmed by Carmin Muskrat [phone]) on 04/15/2019 6:07:17 PM   Radiology Dg Chest Portable 1 View  Result Date: 04/15/2019 CLINICAL DATA:  Chest pain. EXAM: PORTABLE CHEST 1 VIEW COMPARISON:  06/29/2014 FINDINGS: Both lungs are clear. Negative for a pneumothorax. Heart and mediastinum are within normal limits. Trachea is midline. Again noted is scoliosis in the thoracolumbar spine region. IMPRESSION: No active disease. Electronically Signed   By: Markus Daft M.D.   On: 04/15/2019 13:35    Procedures Procedures (including critical care time)  Medications Ordered in ED Medications - No data to display   Initial Impression / Assessment and Plan / ED Course  I have reviewed the triage vital signs and the nursing notes.  Pertinent labs & imaging results that were available during my care of the patient were reviewed by me and considered in my medical decision making (see chart for details).  Clinical Course as of Apr 15 1807  Wed Apr 15, 2019  1527 Reassessed. Updated. In NAD.    [AM]  8756 Spoke with staff for Dr. Tamala Julian who will call back regarding pt. He will contact regarding making appointment for clinic.  Appreciate her involvement.    [AM]  1703 Reassessed. Minimal pain at this time. Appt made at [facility].   [AM]    Clinical Course User Index [AM] Albesa Seen, PA-C       Differential diagnosis includes ACS, PE, thoracic aortic dissection, Boerhaave's syndrome, cardiac tamponade, pneumothorax, incarcerated diaphragmatic hernia, cholecystitis, esophageal spasm, gastroesophageal reflux, herpes zoster of the thorax, pericarditis, pneumonia, chest wall pain, costochondritis.   Doubt ACS, as delta troponin is negative, initial EKG showed no signs of ischemia, infarction, or arrhythmia, and HEART score 4, although CP history highly atypical for ischemic heart disease. Doubt PE as patient is experiencing no shortness of breath, patient has intermittent chest pain, and patient not tachycardic. Other than age, no other risk factors for PE per Wells score and PERC criteria. Doubt TAD by hx, CXR showed no widening mediastinum, and pulses equal in all extremities. Patient remained nontoxic appearing and in no acute distress during emergency department course. Vital signs stable in the emergency department. Therefore, doubt esophageal rupture, cardiac tamponade, or pneumothorax. Pericarditis less likely due to no preceding infectious symptoms and pain not improved in upright positions. No abnormal labs. Patient is aware of diagnostic uncertainty and was given strict return precautions.   Case discussed with cardiologist office for Dr. Tamala Julian and obtained follow up for patient early next week. Risk of admission for CP rule out felt by myself and attending physician to be greater than benefit at this time given very low risk of ACS with negative workup.   Given patient's nighttime chest pain, and her report that she has a history of GERD and stopped pantoprozole for period of time, will restart pantoprazole.   Patient and family understand and are in agreement with plan of care. Personally updated  patient's granddaughter, Ander Purpura, who assists in patient's care.   This is a shared visit with Dr. Jola Schmidt. Patient was independently evaluated by this attending physician. Attending physician consulted in evaluation and discharge management.  Final Clinical Impressions(s) / ED Diagnoses   Final diagnoses:  Left-sided chest pain    ED Discharge Orders         Ordered    Ambulatory referral to Cardiology  04/15/19 1528    pantoprazole (PROTONIX) 20 MG tablet  Daily     04/15/19 1846           Tamala Julian 04/15/19 2313    Jola Schmidt, MD 04/16/19 [phone]

## 2019-04-15 NOTE — ED Triage Notes (Addendum)
Pt arrives to ED from urgent care with complaints of on going chest pain for the last couple of weeks. Pt stated that the pain had gotten worse today. Pt states the pain is across her entire chest and does not radiate.

## 2019-04-15 NOTE — Discharge Instructions (Signed)
Please read and follow all provided instructions.  Your diagnoses today include:  1. Left-sided chest pain     Tests performed today include: An EKG of your heart A chest x-ray Cardiac enzymes - a blood test for heart muscle damage Blood counts and electrolytes Vital signs. See below for your results today.   Medications prescribed:   Take any prescribed medications only as directed.  Please start taking your protonix.   Follow-up instructions: Please follow-up with your primary care provider as soon as you can for further evaluation of your symptoms.   Return instructions:  SEEK IMMEDIATE MEDICAL ATTENTION IF: You have severe chest pain, especially if the pain is crushing or pressure-like and spreads to the arms, back, neck, or jaw, or if you have sweating, nausea (feeling sick to your stomach), or shortness of breath. THIS IS AN EMERGENCY. Don't wait to see if the pain will go away. Get medical help at once. Call 911 or 0 (operator). DO NOT drive yourself to the hospital.  Your chest pain gets worse and does not go away with rest.  You have an attack of chest pain lasting longer than usual, despite rest and treatment with the medications your caregiver has prescribed.  You wake from sleep with chest pain or shortness of breath. You feel dizzy or faint. You have chest pain not typical of your usual pain for which you originally saw your caregiver.  You have any other emergent concerns regarding your health.  Additional Information: Chest pain comes from many different causes. Your caregiver has diagnosed you as having chest pain that is not specific for one problem, but does not require admission.  You are at low risk for an acute heart condition or other serious illness.   Your vital signs today were: BP 127/65    Pulse 67    Temp 98.5 F (36.9 C) (Oral)    Resp 13    Ht 5\' 5"  (1.651 m)    Wt 78 kg    SpO2 99%    BMI 28.62 kg/m  If your blood pressure (BP) was elevated above  135/85 this visit, please have this repeated by your doctor within one month. --------------

## 2019-04-15 NOTE — Telephone Encounter (Signed)
Spoke with Annett Fabian, PA.  She states pt presented with atypical CP, occurring at night.  Not exertional.  States she just wanted to get pt a visit with Dr. Tamala Julian.  Pt currently scheduled for virtual visit with Dr. Tamala Julian 4/28.

## 2019-04-20 ENCOUNTER — Telehealth: Payer: Self-pay

## 2019-04-20 NOTE — Telephone Encounter (Signed)
Pt will use her Granddaughters phone for her video visit. [phone]  YOUR CARDIOLOGY TEAM HAS ARRANGED FOR AN E-VISIT FOR YOUR APPOINTMENT - PLEASE REVIEW IMPORTANT INFORMATION BELOW SEVERAL DAYS PRIOR TO YOUR APPOINTMENT  Due to the recent COVID-19 pandemic, we are transitioning in-person office visits to tele-medicine visits in an effort to decrease unnecessary exposure to our patients, their families, and staff. These visits are billed to your insurance just like a normal visit is. We also encourage you to sign up for MyChart if you have not already done so. You will need a smartphone if possible. For patients that do not have this, we can still complete the visit using a regular telephone but do prefer a smartphone to enable video when possible. You may have a family member that lives with you that can help. If possible, we also ask that you have a blood pressure cuff and scale at home to measure your blood pressure, heart rate and weight prior to your scheduled appointment. Patients with clinical needs that need an in-person evaluation and testing will still be able to come to the office if absolutely necessary. If you have any questions, feel free to call our office.     YOUR PROVIDER WILL BE USING THE FOLLOWING PLATFORM TO COMPLETE YOUR VISIT: Doximity  . IF USING MYCHART - How to Download the MyChart App to Your SmartPhone   - If Apple, go to [employer] and type in MyChart in the search bar and download the app. If Android, ask patient to go to [employer] and type in [place] in the search bar and download the app. The app is free but as with any other app downloads, your phone may require you to verify saved payment information or Apple/Android password.  - You will need to then log into the app with your MyChart username and password, and select  as your healthcare provider to link the account.  - When it is time for your visit, go to the MyChart app, find appointments,  and click Begin Video Visit. Be sure to Select Allow for your device to access the Microphone and Camera for your visit. You will then be connected, and your provider will be with you shortly.  **If you have any issues connecting or need assistance, please contact MyChart service desk [phone])**  **If using a computer, in order to ensure the best quality for your visit, you will need to use either of the following Internet Browsers: Insurance underwriter or [employer]**  . IF USING DOXIMITY or DOXY.ME - The staff will give you instructions on receiving your link to join the meeting the day of your visit.      2-3 DAYS BEFORE YOUR APPOINTMENT  You will receive a telephone call from one of our [place] team members - your caller ID may say "Unknown caller." If this is a video visit, we will walk you through how to get the video launched on your phone. We will remind you check your blood pressure, heart rate and weight prior to your scheduled appointment. If you have an Apple Watch or Kardia, please upload any pertinent ECG strips the day before or morning of your appointment to [place]. Our staff will also make sure you have reviewed the consent and agree to move forward with your scheduled tele-health visit.     THE DAY OF YOUR APPOINTMENT  Approximately 15 minutes prior to your scheduled appointment, you will receive a telephone call from one  of [facility] team - your caller ID may say "Unknown caller."  Our staff will confirm medications, vital signs for the day and any symptoms you may be experiencing. Please have this information available prior to the time of visit start. It may also be helpful for you to have a pad of paper and pen handy for any instructions given during your visit. They will also walk you through joining the smartphone meeting if this is a video visit.    CONSENT FOR TELE-HEALTH VISIT - PLEASE REVIEW  I hereby voluntarily request, consent and authorize  [facility] and its employed or contracted physicians, physician assistants, nurse practitioners or other licensed health care professionals (the Practitioner), to provide me with telemedicine health care services (the "Services") as deemed necessary by the treating Practitioner. I acknowledge and consent to receive the Services by the Practitioner via telemedicine. I understand that the telemedicine visit will involve communicating with the Practitioner through live audiovisual communication technology and the disclosure of certain medical information by electronic transmission. I acknowledge that I have been given the opportunity to request an in-person assessment or other available alternative prior to the telemedicine visit and am voluntarily participating in the telemedicine visit.  I understand that I have the right to withhold or withdraw my consent to the use of telemedicine in the course of my care at any time, without affecting my right to future care or treatment, and that the Practitioner or I may terminate the telemedicine visit at any time. I understand that I have the right to inspect all information obtained and/or recorded in the course of the telemedicine visit and may receive copies of available information for a reasonable fee.  I understand that some of the potential risks of receiving the Services via telemedicine include:  Marland Kitchen Delay or interruption in medical evaluation due to technological equipment failure or disruption; . Information transmitted may not be sufficient (e.g. poor resolution of images) to allow for appropriate medical decision making by the Practitioner; and/or  . In rare instances, security protocols could fail, causing a breach of personal health information.  Furthermore, I acknowledge that it is my responsibility to provide information about my medical history, conditions and care that is complete and accurate to the best of my ability. I acknowledge that  Practitioner's advice, recommendations, and/or decision may be based on factors not within their control, such as incomplete or inaccurate data provided by me or distortions of diagnostic images or specimens that may result from electronic transmissions. I understand that the practice of medicine is not an exact science and that Practitioner makes no warranties or guarantees regarding treatment outcomes. I acknowledge that I will receive a copy of this consent concurrently upon execution via email to the email address I last provided but may also request a printed copy by calling the office of [place].    I understand that my insurance will be billed for this visit.   I have read or had this consent read to me. . I understand the contents of this consent, which adequately explains the benefits and risks of the Services being provided via telemedicine.  . I have been provided ample opportunity to ask questions regarding this consent and the Services and have had my questions answered to my satisfaction. . I give my informed consent for the services to be provided through the use of telemedicine in my medical care  By participating in this telemedicine visit I agree to the above.

## 2019-04-20 NOTE — Progress Notes (Signed)
Virtual Visit via Video Note   This visit type was conducted due to national recommendations for restrictions regarding the COVID-19 Pandemic (e.g. social distancing) in an effort to limit this patient's exposure and mitigate transmission in our community.  Due to her co-morbid illnesses, this patient is at least at moderate risk for complications without adequate follow up.  This format is felt to be most appropriate for this patient at this time.  All issues noted in this document were discussed and addressed.  A limited physical exam was performed with this format.  Please refer to the patient's chart for her consent to telehealth for [facility].   Evaluation Performed:  Follow-up visit  Date:  04/20/2019   ID:  Kristina Carlson, Kristina Carlson Nov 14, 1936, MRN [id number]  Patient Location: Home Provider Location: Office  PCP:  [place] Bing, DO  Cardiologist:  No primary care provider on file.  Electrophysiologist:  None   Chief Complaint:  Chest pain  History of Present Illness:    Kristina Carlson is a 83 y.o. female with essential hypertension, hyperlipidemia, hypothyroidism, and non-obstructive CAD(2015).  Recent emergency room visit for chest pain.  Evaluation data was closely reviewed.  EKG was normal.  Cardiac markers were unremarkable.  She characterizes the chest pain is focal left parasternal that awakens her from sleep with an intensity of 7/10.  He usually goes away after she gets up and starts moving around during the day.  The discomfort can last for hours.  It is an aching discomfort.  It does not radiate.  No associated symptoms.  In fact she is having the discomfort now, that she grades at 3/10 in intensity.  Activity does not make it worse.  She has no other complaints.  This feels similar to the discomfort that she had that led to coronary angiography several years ago.    The patient does not have symptoms concerning for COVID-19 infection (fever, chills, cough, or new  shortness of breath).    Past Medical History:  Diagnosis Date  . Hyperlipidemia   . Hypertension   . Hypothyroid   . Insomnia   . Macular degeneration    -- Dr. Laurey Arrow (optometrist).  Taking Ocuvite.   Past Surgical History:  Procedure Laterality Date  . LEFT HEART CATHETERIZATION WITH CORONARY ANGIOGRAM N/A 06/29/2014   Procedure: LEFT HEART CATHETERIZATION WITH CORONARY ANGIOGRAM;  Surgeon: Sinclair Grooms, MD;  Location: [facility] CATH LAB;  Service: Cardiovascular;  Laterality: N/A;     No outpatient medications have been marked as taking for the 04/21/19 encounter (Appointment) with Belva Crome, MD.     Allergies:   Lisinopril   Social History   Tobacco Use  . Smoking status: Passive Smoke Exposure - Never Smoker  . Smokeless tobacco: Never Used  Substance Use Topics  . Alcohol use: No  . Drug use: No     Family Hx: The patient's family history includes Diabetes in her brother; Stroke in her father.  ROS:   Please see the history of present illness.    Otherwise feels she is doing okay.  She is ambulatory and able to do for herself at home. All other systems reviewed and are negative.   Prior CV studies:   The following studies were reviewed today: CARDIAC CATH 06/2014:  Mild LAD 50%  OM 1 30%  50% ostial RCA  Labs/Other Tests and Data Reviewed:    EKG:  An ECG dated 04/15/2019 was personally reviewed today  and demonstrated:  Demonstrated normal sinus rhythm with low voltage.  The tracing was otherwise normal.  Recent Labs: 06/19/2018: TSH 1.530 04/15/2019: ALT 37; BUN 13; Creatinine, Ser 0.85; Hemoglobin 14.6; Platelets 206; Potassium 4.2; Sodium 143   Recent Lipid Panel Lab Results  Component Value Date/Time   CHOL 133 06/19/2018 10:30 AM   TRIG 102 06/19/2018 10:30 AM   HDL 36 (L) 06/19/2018 10:30 AM   CHOLHDL 3.7 06/19/2018 10:30 AM   CHOLHDL 4.0 07/04/2015 08:29 AM   LDLCALC 77 06/19/2018 10:30 AM   LDLDIRECT 67 10/23/2017 10:03 AM    LDLDIRECT 95 06/20/2016 09:36 AM    Wt Readings from Last 3 Encounters:  04/15/19 172 lb (78 kg)  09/22/18 178 lb 6.4 oz (80.9 kg)  06/19/18 174 lb (78.9 kg)     Objective:    Vital Signs:  There were no vitals taken for this visit.   VITAL SIGNS:  reviewed GEN:  Elderly RESPIRATORY:  normal respiratory effort, symmetric expansion NEURO:  alert and oriented x 3, no obvious focal deficit  ASSESSMENT & PLAN:    1. Chest pain of uncertain etiology   2. Coronary artery disease involving native coronary artery of native heart without angina pectoris   3. Essential hypertension   4. Hyperlipidemia LDL goal <70    PLAN:  1. The discomfort strikes me as being musculoskeletal/nonischemic.  We will try ibuprofen 400 mg twice daily for 4 doses with food.  Also given her a prescription for nitroglycerin 0.4 mg to be used if the discomfort is severe that awakens her in the morning.  Cautioned not to stand or try to ambulate for at least 15 minutes after the nitroglycerin.  She needs to report the results with sublingual nitroglycerin.  She is currently on adequate therapy including beta-blocker, ARB, and aspirin, and statin therapy. 2. It is difficult to tell if there is progression of coronary disease.  Data during the emergency room rule out is unremarkable.  EKG is normal. 3. Blood pressure is stable. 4. LDL was 77 in June 2019.  Do not want to miss progression of coronary disease.  Given the chronicity of the discomfort (now greater than 2 weeks), normal EKG, negative markers x2, prolonged discomfort, with atypical features I am leaning towards musculoskeletal discomfort.  She will give [place] follow-up on the course of her discomfort in the next 48 to 72 hours.  COVID-19 Education: The signs and symptoms of COVID-19 were discussed with the patient and how to seek care for testing (follow up with PCP or arrange E-visit).  The importance of social distancing was discussed today.  Time:    Today, I have spent 20  minutes with the patient with telehealth technology discussing the above problems.     Medication Adjustments/Labs and Tests Ordered: Current medicines are reviewed at length with the patient today.  Concerns regarding medicines are outlined above.   Tests Ordered: No orders of the defined types were placed in this encounter.   Medication Changes: No orders of the defined types were placed in this encounter.   Disposition:  Follow up prn  Signed, Sinclair Grooms, MD  04/20/2019 6:14 PM    [facility]

## 2019-04-21 ENCOUNTER — Other Ambulatory Visit: Payer: Self-pay

## 2019-04-21 ENCOUNTER — Telehealth (INDEPENDENT_AMBULATORY_CARE_PROVIDER_SITE_OTHER): Payer: Medicare Other | Admitting: Interventional Cardiology

## 2019-04-21 ENCOUNTER — Encounter: Payer: Self-pay | Admitting: Interventional Cardiology

## 2019-04-21 VITALS — BP 146/68 | Ht 65.0 in | Wt 171.0 lb

## 2019-04-21 DIAGNOSIS — R079 Chest pain, unspecified: Secondary | ICD-10-CM

## 2019-04-21 DIAGNOSIS — I2510 Atherosclerotic heart disease of native coronary artery without angina pectoris: Secondary | ICD-10-CM

## 2019-04-21 DIAGNOSIS — I10 Essential (primary) hypertension: Secondary | ICD-10-CM

## 2019-04-21 DIAGNOSIS — R0789 Other chest pain: Secondary | ICD-10-CM

## 2019-04-21 DIAGNOSIS — E785 Hyperlipidemia, unspecified: Secondary | ICD-10-CM

## 2019-04-21 MED ORDER — NITROGLYCERIN 0.4 MG SL SUBL: 0.4000 mg | SUBLINGUAL_TABLET | SUBLINGUAL | 1 refills | Status: DC | PRN

## 2019-04-21 NOTE — Patient Instructions (Signed)
Medication Instructions:  1) Please take Advil/Ibuprofen 400 mg twice daily with food for 4 doses (2 days) 2) You are prescribed NITROGLYCERIN for severe chest pain that wakes you up from sleep. Make sure to follow instructions to take this medication. A single nitroglycerin sublingual tablet should be placed under your tongue and allowed to dissolve. Do not chew or swallow the tablet. You may take an additional tablet every 5 minutes, but no more than 3 tablets in 15 minutes. If your chest pain does not resolve after taking 3 tablets, seek immediate medical attention.   Follow-Up: Please call our office Thursday or Friday for an update.

## 2019-04-30 ENCOUNTER — Telehealth: Payer: Self-pay | Admitting: Interventional Cardiology

## 2019-04-30 NOTE — Telephone Encounter (Signed)
Spoke with pt and advised if sx return to contact the office.  Pt verbalized understanding.

## 2019-04-30 NOTE — Telephone Encounter (Signed)
Pt called to report to April that she is feeling much better. Pt went to ED after speaking with April, and feels much better after ED visit

## 2019-06-05 ENCOUNTER — Other Ambulatory Visit: Payer: Self-pay | Admitting: Interventional Cardiology

## 2019-06-08 ENCOUNTER — Other Ambulatory Visit: Payer: Self-pay

## 2019-06-08 ENCOUNTER — Encounter: Payer: Self-pay | Admitting: Family Medicine

## 2019-06-08 ENCOUNTER — Ambulatory Visit (INDEPENDENT_AMBULATORY_CARE_PROVIDER_SITE_OTHER): Payer: Medicare Other | Admitting: Family Medicine

## 2019-06-08 VITALS — BP 180/82 | HR 79

## 2019-06-08 DIAGNOSIS — E538 Deficiency of other specified B group vitamins: Secondary | ICD-10-CM | POA: Diagnosis not present

## 2019-06-08 DIAGNOSIS — K219 Gastro-esophageal reflux disease without esophagitis: Secondary | ICD-10-CM

## 2019-06-08 MED ORDER — VITAMIN B-12 1000 MCG PO TABS: 1000.0000 ug | ORAL_TABLET | Freq: Every day | ORAL | 3 refills | Status: DC

## 2019-06-08 MED ORDER — PANTOPRAZOLE SODIUM 40 MG PO TBEC: 40.0000 mg | DELAYED_RELEASE_TABLET | Freq: Every day | ORAL | 0 refills | Status: DC

## 2019-06-08 NOTE — Progress Notes (Signed)
   Subjective   Patient ID: Kristina Carlson    DOB: 01/05/1936, 83 y.o. female   MRN: [id number]  CC: "chest pain"  HPI: Kristina Carlson is a 83 y.o. female who presents to clinic today for the following:  Chest pain: Kristina Carlson is presented today for follow-up concerning chronic chest discomfort located inferior to her sternum.  She has a history of GERD with Protonix use which was discontinued after she presented on 04/15/2019 with substernal chest pain.  Patient tried medication for 2 weeks but discontinued because she felt like it was not working.  She subsequently was seen at the emergency room for an EKG was reassuring along with normal troponins.  Follow-up with cardiology via tele-visit supported noncardiac chest pain.  Patient has not been using any antireflux medications and does enjoy coffee and tea in the morning and evening.  She has a EGD back in 2015 which was normal.  She denies any shortness of breath, unexplained weight loss.  ROS: see HPI for pertinent.  [place]: Reviewed. Smoking status reviewed. Medications reviewed.  Objective   BP (!) 180/82   Pulse 79   SpO2 99%  Vitals and nursing note reviewed.  General: well nourished, well developed, NAD with non-toxic appearance HEENT: normocephalic, atraumatic, moist mucous membranes Neck: supple, non-tender without lymphadenopathy Cardiovascular: regular rate and rhythm without murmurs, rubs, or gallops Lungs: clear to auscultation bilaterally with normal work of breathing Abdomen: soft, non-tender, non-distended, normoactive bowel sounds Skin: warm, dry, no rashes or lesions, cap refill < 2 seconds Extremities: warm and well perfused, normal tone, no edema  Assessment & Plan   GERD (gastroesophageal reflux disease) Likely source of chest discomfort.  May be costochondritis, although GERD seems to fit the picture.  Weight stable.  No red flags. - Given trial for high-dose PPI with Protonix 40 mg daily and instructed to take  Pepcid 20 mg daily tonight and as needed - Advised to discontinue caffeine products after 2 PM and instructed to avoid eating and drinking 2 hours prior to bedtime and keep head of bed elevated - Reviewed return precautions  No orders of the defined types were placed in this encounter.  Meds ordered this encounter  Medications  . vitamin B-12 (CYANOCOBALAMIN) 1000 MCG tablet    Sig: Take 1 tablet (1,000 mcg total) by mouth daily.    Dispense:  90 tablet    Refill:  3  . pantoprazole (PROTONIX) 40 MG tablet    Sig: Take 1 tablet (40 mg total) by mouth daily for 30 days.    Dispense:  30 tablet    Refill:  0    Harriet Butte, DO [place], PGY-3 06/08/2019, 5:00 PM

## 2019-06-08 NOTE — Patient Instructions (Signed)
Thank you for coming in to see [place] today. Please see below to review our plan for today's visit.  1.  Your chest burning and discomfort is likely related to acid reflux.  I did call in a refill for the high-dose Protonix 40 mg daily.  Take this medication as directed.  You can also take Pepcid 20 mg over-the-counter on an as-needed basis. 2.  I did call in a refill for your vitamin B12.  Please call the clinic at [phone] if your symptoms worsen or you have any concerns. It was our pleasure to serve you.  Harriet Butte, [place], PGY-3

## 2019-06-08 NOTE — Assessment & Plan Note (Signed)
Likely source of chest discomfort.  May be costochondritis, although GERD seems to fit the picture.  Weight stable.  No red flags. - Given trial for high-dose PPI with Protonix 40 mg daily and instructed to take Pepcid 20 mg daily tonight and as needed - Advised to discontinue caffeine products after 2 PM and instructed to avoid eating and drinking 2 hours prior to bedtime and keep head of bed elevated - Reviewed return precautions

## 2019-07-10 ENCOUNTER — Other Ambulatory Visit: Payer: Self-pay | Admitting: *Deleted

## 2019-07-10 DIAGNOSIS — I2510 Atherosclerotic heart disease of native coronary artery without angina pectoris: Secondary | ICD-10-CM

## 2019-07-10 DIAGNOSIS — K219 Gastro-esophageal reflux disease without esophagitis: Secondary | ICD-10-CM

## 2019-07-11 MED ORDER — ATORVASTATIN CALCIUM 40 MG PO TABS: 40.0000 mg | ORAL_TABLET | Freq: Every day | ORAL | 3 refills | Status: DC

## 2019-07-11 MED ORDER — PANTOPRAZOLE SODIUM 40 MG PO TBEC: 40.0000 mg | DELAYED_RELEASE_TABLET | Freq: Every day | ORAL | 0 refills | Status: DC

## 2019-07-24 ENCOUNTER — Telehealth: Payer: Medicare Other | Admitting: Family Medicine

## 2019-07-24 ENCOUNTER — Other Ambulatory Visit: Payer: Self-pay

## 2019-07-28 ENCOUNTER — Other Ambulatory Visit: Payer: Self-pay

## 2019-07-28 ENCOUNTER — Telehealth (INDEPENDENT_AMBULATORY_CARE_PROVIDER_SITE_OTHER): Payer: Medicare Other | Admitting: Family Medicine

## 2019-07-28 DIAGNOSIS — J988 Other specified respiratory disorders: Secondary | ICD-10-CM | POA: Diagnosis not present

## 2019-07-28 DIAGNOSIS — U071 COVID-19: Secondary | ICD-10-CM | POA: Diagnosis not present

## 2019-07-28 NOTE — Progress Notes (Signed)
[place] Telemedicine Visit I connected with  Kristina Carlson on 07/28/19 by a video enabled telemedicine application and verified that I am speaking with the correct person using two identifiers.   I discussed the limitations of evaluation and management by telemedicine. The patient expressed understanding and agreed to proceed.  Patient consented to have virtual visit. Method of visit: Telephone  Encounter participants: Patient: Kristina Carlson - located at Home Provider: Caroline More - located at [facility] Others (if applicable): None  Chief Complaint: concern regarding COVID  HPI: COVID Reports she has had nausea x2-3 days. Also has had cough since 7/18. Has phlegm in her throat that is trying to cough up. Was tested for COVID and was positive.   Patient is currently quarantine. Her son tested positive and is staying with her. Has an appointment on Saturday to be re-tested.   Patient reports that ever since April "has been having problems". Has been told she has acid reflux. No one ever checked her for COVID. Even went to ED and was told it is not her heart. Talked to cardiologist and informed her it was not her heart. Patient denies SOB or CP.   ROS: per HPI  Pertinent PMHx: CAD, HTN, GERD  Exam:  Respiratory: speaking full sentences, no respiratory distress   Assessment/Plan:  COVID-19 Patient with positive coronavirus test at outpatient lab center.  Is currently under quarantine with her son who also tested positive.  Conservative measures discussed such as drinking honey for cough, Tylenol for fever, drinking plenty of fluids.  Advised to increase water intake to help thin mucus as well.  Advised against using ibuprofen.  Strict return precautions given.  Advised to go to the emergency room with any shortness of breath or chest pain.  Patient should continue to quarantine until no further symptoms.  Strict return precautions given.  Follow-up as needed.     Time spent during visit with patient: 9 minutes

## 2019-07-28 NOTE — Assessment & Plan Note (Addendum)
Patient with positive coronavirus test at outpatient lab center.  Is currently under quarantine with her son who also tested positive.  Conservative measures discussed such as drinking honey for cough, Tylenol for fever, drinking plenty of fluids.  Advised to increase water intake to help thin mucus as well.  Advised against using ibuprofen.  Strict return precautions given.  Advised to go to the emergency room with any shortness of breath or chest pain.  Patient should continue to quarantine until no further symptoms.  Strict return precautions given.  Follow-up as needed.

## 2019-07-31 ENCOUNTER — Other Ambulatory Visit: Payer: Self-pay

## 2019-07-31 DIAGNOSIS — Z20822 Contact with and (suspected) exposure to covid-19: Secondary | ICD-10-CM

## 2019-08-01 LAB — NOVEL CORONAVIRUS, NAA: SARS-CoV-2, NAA: NOT DETECTED

## 2019-08-04 ENCOUNTER — Other Ambulatory Visit: Payer: Self-pay

## 2019-08-04 DIAGNOSIS — K219 Gastro-esophageal reflux disease without esophagitis: Secondary | ICD-10-CM

## 2019-08-04 MED ORDER — PANTOPRAZOLE SODIUM 40 MG PO TBEC: 40.0000 mg | DELAYED_RELEASE_TABLET | Freq: Every day | ORAL | 0 refills | Status: DC

## 2019-08-07 ENCOUNTER — Other Ambulatory Visit: Payer: Self-pay

## 2019-08-07 DIAGNOSIS — Z20822 Contact with and (suspected) exposure to covid-19: Secondary | ICD-10-CM

## 2019-08-09 LAB — NOVEL CORONAVIRUS, NAA: SARS-CoV-2, NAA: NOT DETECTED

## 2019-08-12 DIAGNOSIS — H353133 Nonexudative age-related macular degeneration, bilateral, advanced atrophic without subfoveal involvement: Secondary | ICD-10-CM | POA: Diagnosis not present

## 2019-08-28 ENCOUNTER — Other Ambulatory Visit: Payer: Self-pay | Admitting: Family Medicine

## 2019-08-28 DIAGNOSIS — Z1231 Encounter for screening mammogram for malignant neoplasm of breast: Secondary | ICD-10-CM

## 2019-09-08 ENCOUNTER — Other Ambulatory Visit: Payer: Self-pay | Admitting: *Deleted

## 2019-09-08 DIAGNOSIS — I10 Essential (primary) hypertension: Secondary | ICD-10-CM

## 2019-09-08 MED ORDER — LOSARTAN POTASSIUM 50 MG PO TABS: 50.0000 mg | ORAL_TABLET | Freq: Every day | ORAL | 3 refills | Status: DC

## 2019-10-15 ENCOUNTER — Ambulatory Visit (INDEPENDENT_AMBULATORY_CARE_PROVIDER_SITE_OTHER): Payer: Medicare Other | Admitting: Family Medicine

## 2019-10-15 ENCOUNTER — Other Ambulatory Visit: Payer: Self-pay

## 2019-10-15 ENCOUNTER — Ambulatory Visit
Admission: RE | Admit: 2019-10-15 | Discharge: 2019-10-15 | Disposition: A | Discharge status: Home / Self Care | Payer: Medicare Other | Source: Ambulatory Visit | Attending: Family Medicine | Admitting: Family Medicine

## 2019-10-15 VITALS — BP 150/82 | HR 90 | Wt 165.6 lb

## 2019-10-15 DIAGNOSIS — Z1231 Encounter for screening mammogram for malignant neoplasm of breast: Secondary | ICD-10-CM | POA: Diagnosis not present

## 2019-10-15 DIAGNOSIS — R0789 Other chest pain: Secondary | ICD-10-CM | POA: Diagnosis not present

## 2019-10-15 DIAGNOSIS — E785 Hyperlipidemia, unspecified: Secondary | ICD-10-CM

## 2019-10-15 MED ORDER — MELOXICAM 15 MG PO TABS: 15.0000 mg | ORAL_TABLET | Freq: Every day | ORAL | 0 refills | Status: DC

## 2019-10-15 NOTE — Patient Instructions (Signed)
It was great meeting you today!  I think your chest pain is due to a pectoral muscle strain.  I gave you some rehab exercises for this.  I also gave you a prescription for a strong anti-inflammatory.  I would continue to take Tylenol as needed for the pain outside of the meloxicam.  I gave you a handout for Voltaren gel which you can apply this area and should be helpful.  I will give you a call if the mammogram comes back abnormal in some way but it looks good by my read.  I do not think that your symptoms are due to a heart problem or due to reflux.

## 2019-10-16 LAB — LIPID PANEL
Chol/HDL Ratio: 3.8 ratio (ref 0.0–4.4)
Cholesterol, Total: 130 mg/dL (ref 100–199)
HDL: 34 mg/dL — ABNORMAL LOW (ref >39)
LDL Chol Calc (NIH): 62 mg/dL (ref 0–99)
Triglycerides: 205 mg/dL — ABNORMAL HIGH (ref 0–149)
VLDL Cholesterol Cal: 34 mg/dL (ref 5–40)

## 2019-10-19 ENCOUNTER — Encounter: Payer: Self-pay | Admitting: Family Medicine

## 2019-10-19 DIAGNOSIS — R0789 Other chest pain: Secondary | ICD-10-CM | POA: Insufficient documentation

## 2019-10-19 NOTE — Assessment & Plan Note (Signed)
Patient with left chest wall tenderness in distribution of left pectoral muscle. Has been evaluated by cardiology in the past and this is not felt to be consistent with cardiac issue. Has been trialed on ppi and this did not help either. Gave patient pectoral muscle rehab exercises. Mammogram performed on day of clinic visit which did not show any abnormality. Gave patient 5 day supply of meloxicam to see if this might help with inflammation. Recommended ice for 59minutes bid and heating pad to the area. Can also try topical liniment such as biofreeze or voltaren gel to see if this helps. - meloxicam 15mg  daily for 5 days - alternate ice and heat - gave rehab excercises - topical liniments as needed - follow up prn - gave return precautions, discussed worrying findings.

## 2019-10-19 NOTE — Progress Notes (Signed)
   HPI 83 year old female who presents for left chest/breat pain. She states that the pain has been occurring intermittently for 6 months or so. It is located in her superior left chest above the area of breast tissue. It only occurs with activity and is not present at rest. The pain is dull and aching most of the time.  This was first evaluate back in June 2020. She was referred to cardiology who did not fee like her pain was consistent with cardiac etiology. She was given nitroglycerin at that appointment and instructed to try it if she developed the chest pain. She trailed this medication and it did not improve her pain. She was trialed on a PPI in late June, in the event her pain was consistent with GERD. This has not helped her pain either.  Mammogram performed at day of clinic visit with no abnormality seen.  CC: chest pain   ROS:   Review of Systems See HPI for ROS.   CC, SH/smoking status, and VS noted  Objective: BP (!) 150/82   Pulse 90   Wt 165 lb 9.6 oz (75.1 kg)   SpO2 97%   BMI 27.56 kg/m  Gen: very pleasant 83 year old female, no acute distress, comfortable CV: rrr, no m/r/g Resp: lungs ctab, no accessory muscle use Neuro: Alert and oriented, Speech clear, No gross deficits Left breast: no masses, lumps, or tenderness elicited within breast tissue. Left chest wall: with patient arm above head, the left superior pectoralis muscle was tender to palpation. Tenderness also elicited with pectoral flexion.   Assessment and plan:  Chest wall tenderness Patient with left chest wall tenderness in distribution of left pectoral muscle. Has been evaluated by cardiology in the past and this is not felt to be consistent with cardiac issue. Has been trialed on ppi and this did not help either. Gave patient pectoral muscle rehab exercises. Mammogram performed on day of clinic visit which did not show any abnormality. Gave patient 5 day supply of meloxicam to see if this might help  with inflammation. Recommended ice for 96minutes bid and heating pad to the area. Can also try topical liniment such as biofreeze or voltaren gel to see if this helps. - meloxicam 15mg  daily for 5 days - alternate ice and heat - gave rehab excercises - topical liniments as needed - follow up prn - gave return precautions, discussed worrying findings.   Orders Placed This Encounter  Procedures  . Lipid panel    Order Specific Question:   Has the patient fasted?    Answer:   No    Meds ordered this encounter  Medications  . meloxicam (MOBIC) 15 MG tablet    Sig: Take 1 tablet (15 mg total) by mouth daily.    Dispense:  5 tablet    Refill:  0     Guadalupe Dawn MD PGY-3 Family Medicine Resident  10/19/2019 11:20 AM

## 2019-12-29 DIAGNOSIS — Z20822 Contact with and (suspected) exposure to covid-19: Secondary | ICD-10-CM | POA: Diagnosis not present

## 2019-12-29 DIAGNOSIS — R070 Pain in throat: Secondary | ICD-10-CM | POA: Diagnosis not present

## 2020-02-17 DIAGNOSIS — H353133 Nonexudative age-related macular degeneration, bilateral, advanced atrophic without subfoveal involvement: Secondary | ICD-10-CM | POA: Diagnosis not present

## 2020-02-17 DIAGNOSIS — Z9841 Cataract extraction status, right eye: Secondary | ICD-10-CM | POA: Diagnosis not present

## 2020-02-17 DIAGNOSIS — H0288A Meibomian gland dysfunction right eye, upper and lower eyelids: Secondary | ICD-10-CM | POA: Diagnosis not present

## 2020-02-17 DIAGNOSIS — H52223 Regular astigmatism, bilateral: Secondary | ICD-10-CM | POA: Diagnosis not present

## 2020-02-17 DIAGNOSIS — H0288B Meibomian gland dysfunction left eye, upper and lower eyelids: Secondary | ICD-10-CM | POA: Diagnosis not present

## 2020-02-17 DIAGNOSIS — Z9842 Cataract extraction status, left eye: Secondary | ICD-10-CM | POA: Diagnosis not present

## 2020-02-29 ENCOUNTER — Ambulatory Visit: Payer: Medicare Other | Attending: Internal Medicine

## 2020-02-29 DIAGNOSIS — Z23 Encounter for immunization: Secondary | ICD-10-CM | POA: Insufficient documentation

## 2020-02-29 NOTE — Progress Notes (Signed)
   Covid-19 Vaccination Clinic  Name:  Kristina Carlson    MRN: [id number] DOB: 1936-02-25  02/29/2020  [name] was observed post Covid-19 immunization for 15 minutes without incident. She was provided with Vaccine Information Sheet and instruction to access the V-Safe system.   [name] was instructed to call 911 with any severe reactions post vaccine: Marland Kitchen Difficulty breathing  . Swelling of face and throat  . A fast heartbeat  . A bad rash all over body  . Dizziness and weakness   Immunizations Administered    Name Date Dose VIS Date Route   Pfizer COVID-19 Vaccine 02/29/2020 10:52 AM 0.3 mL 12/04/2019 Intramuscular   Manufacturer: [place]   Lot: [id number]   [place]: [id number]

## 2020-03-22 ENCOUNTER — Ambulatory Visit: Payer: Medicare Other | Attending: Internal Medicine

## 2020-03-22 DIAGNOSIS — Z23 Encounter for immunization: Secondary | ICD-10-CM

## 2020-03-22 NOTE — Progress Notes (Signed)
   Covid-19 Vaccination Clinic  Name:  Kristina Carlson    MRN: [id number] DOB: March 06, 1936  03/22/2020  Ms. Swiss was observed post Covid-19 immunization for 15 minutes without incident. She was provided with Vaccine Information Sheet and instruction to access the V-Safe system.   Ms. Funches was instructed to call 911 with any severe reactions post vaccine: Marland Kitchen Difficulty breathing  . Swelling of face and throat  . A fast heartbeat  . A bad rash all over body  . Dizziness and weakness   Immunizations Administered    Name Date Dose VIS Date Route   Pfizer COVID-19 Vaccine 03/22/2020 12:15 PM 0.3 mL 12/04/2019 Intramuscular   Manufacturer: [place]   Lot: [id number]   [place]: [id number]

## 2020-03-29 ENCOUNTER — Ambulatory Visit: Payer: Medicare Other

## 2020-03-29 ENCOUNTER — Other Ambulatory Visit: Payer: Self-pay

## 2020-03-30 ENCOUNTER — Ambulatory Visit (INDEPENDENT_AMBULATORY_CARE_PROVIDER_SITE_OTHER): Payer: Medicare Other

## 2020-03-30 ENCOUNTER — Other Ambulatory Visit: Payer: Self-pay

## 2020-03-30 VITALS — Ht 65.0 in | Wt 165.0 lb

## 2020-03-30 DIAGNOSIS — Z0000 Encounter for general adult medical examination without abnormal findings: Secondary | ICD-10-CM | POA: Diagnosis not present

## 2020-03-30 NOTE — Progress Notes (Addendum)
Subjective:   Kristina Carlson is a 84 y.o. female who presents for Medicare Annual (Subsequent) preventive examination.  The patient consented to a virtual visit.  Review of Systems: Defer to PCP.  Cardiac Risk Factors include: advanced age (>45men, >8 women);hypertension  Objective:   Vitals: Ht 5\' 5"  (1.651 m)   Wt 165 lb (74.8 kg)   BMI 27.46 kg/m   Body mass index is 27.46 kg/m.  Advanced Directives 03/30/2020 10/15/2019 06/08/2019 04/15/2019 06/19/2018 09/17/2017 08/12/2017  Does Patient Have a Medical Advance Directive? Yes No No Yes No Yes Yes  Type of Advance Directive Living will;Healthcare Power of [place];Living will - Living will Living will  Does patient want to make changes to medical advance directive? - - - No - Patient declined - - -  Copy of [place] in Chart? No - copy requested - - - - - -  Would patient like information on creating a medical advance directive? - No - Patient declined No - Patient declined - No - Patient declined - -   Tobacco Social History   Tobacco Use  Smoking Status Never Smoker  Smokeless Tobacco Never Used     Clinical Intake:  Pre-visit preparation completed: Yes  How often do you need to have someone help you when you read instructions, pamphlets, or other written materials from your doctor or pharmacy?: 2 - Rarely What is the last grade level you completed in school?: high school  Past Medical History:  Diagnosis Date  . Hyperlipidemia   . Hypertension   . Hypothyroid   . Insomnia   . Macular degeneration    -- Dr. Laurey Arrow (optometrist).  Taking Ocuvite.   Past Surgical History:  Procedure Laterality Date  . LEFT HEART CATHETERIZATION WITH CORONARY ANGIOGRAM N/A 06/29/2014   Procedure: LEFT HEART CATHETERIZATION WITH CORONARY ANGIOGRAM;  Surgeon: Sinclair Grooms, MD;  Location: [facility] CATH LAB;  Service: Cardiovascular;  Laterality: N/A;   Family History  Problem  Relation Age of Onset  . Diabetes Brother   . Stroke Father    Social History   Socioeconomic History  . Marital status: Widowed    Spouse name: Not on file  . Number of children: 1  . Years of education: 38  . Highest education level: High school graduate  Occupational History  . Occupation: Retired   Tobacco Use  . Smoking status: Never Smoker  . Smokeless tobacco: Never Used  Substance and Sexual Activity  . Alcohol use: No  . Drug use: No  . Sexual activity: Not Currently  Other Topics Concern  . Not on file  Social History Narrative   Patient is a widow, her son and grandson live 1/2 mile down the street from her.   Patient is very social in her community and church.    Patient enjoys reading, watching TV, and spending time with her family.    Social Determinants of Health   Financial Resource Strain: Low Risk   . Difficulty of Paying Living Expenses: Not hard at all  Food Insecurity: No Food Insecurity  . Worried About Charity fundraiser in the Last Year: Never true  . Ran Out of Food in the Last Year: Never true  Transportation Needs: No Transportation Needs  . Lack of Transportation (Medical): No  . Lack of Transportation (Non-Medical): No  Physical Activity: Inactive  . Days of Exercise per Week: 0 days  . Minutes of  Exercise per Session: 0 min  Stress: No Stress Concern Present  . Feeling of Stress : Not at all  Social Connections: Slightly Isolated  . Frequency of Communication with Friends and Family: More than three times a week  . Frequency of Social Gatherings with Friends and Family: More than three times a week  . Attends Religious Services: More than 4 times per year  . Active Member of Clubs or Organizations: Yes  . Attends Archivist Meetings: More than 4 times per year  . Marital Status: Widowed   Outpatient Encounter Medications as of 03/30/2020  Medication Sig  . aspirin 81 MG EC tablet TAKE 1 TABLET (81 MG TOTAL) BY MOUTH DAILY.  SWALLOW WHOLE.  Marland Kitchen atorvastatin (LIPITOR) 40 MG tablet Take 1 tablet (40 mg total) by mouth daily.  . Calcium Carb-Cholecalciferol (CALCIUM 600+D3) 600-200 MG-UNIT TABS Take 1 tablet by mouth daily.   Marland Kitchen levothyroxine (SYNTHROID) 75 MCG tablet TAKE 1 TABLET BY MOUTH DAILY BEFORE BREAKFAST. TAKE 1 TABLET (75 MCG TOTAL) BY MOUTH DAILY.  Marland Kitchen losartan (COZAAR) 50 MG tablet Take 1 tablet (50 mg total) by mouth daily.  . Multiple Vitamins-Minerals (OCUVITE PRESERVISION) TABS Take 1 tablet by mouth 2 (two) times a day.   . nitroGLYCERIN (NITROSTAT) 0.4 MG SL tablet PLACE 1 TABLET UNDER THE TONGUE EVERY 5 MINS AS NEEDED FOR CHEST PAIN. YOU MAY TAKE UP TO 3 CONSECUTIVE TABLETS. IF SYMPTOMS UNRELIEVED, SEEK MEDICAL ATTENTION.  . vitamin B-12 (CYANOCOBALAMIN) 1000 MCG tablet Take 1 tablet (1,000 mcg total) by mouth daily.  . meloxicam (MOBIC) 15 MG tablet Take 1 tablet (15 mg total) by mouth daily. (Patient not taking: Reported on 03/30/2020)  . pantoprazole (PROTONIX) 40 MG tablet Take 1 tablet (40 mg total) by mouth daily.   No facility-administered encounter medications on file as of 03/30/2020.   Activities of Daily Living In your present state of health, do you have any difficulty performing the following activities: 03/30/2020  Hearing? N  Vision? N  Difficulty concentrating or making decisions? N  Walking or climbing stairs? N  Dressing or bathing? N  Doing errands, shopping? N  Preparing Food and eating ? N  Using the Toilet? N  In the past six months, have you accidently leaked urine? Y  Do you have problems with loss of bowel control? N  Managing your Medications? N  Managing your Finances? N  Housekeeping or managing your Housekeeping? N  Some recent data might be hidden   Patient Care Team: Caroline More, DO as PCP - General (Family Medicine)    Assessment:   This is a routine wellness examination for Kristina Carlson.  Exercise Activities and Dietary recommendations Current Exercise Habits: The  patient does not participate in regular exercise at present  Goals    . Exercise 3x per week (30 min per time)     Walk around the perimeter of your home or outside with the nicer weather with a friend or loved one.       Fall Risk Fall Risk  03/30/2020 10/15/2019 06/08/2019 06/19/2018 10/03/2016  Falls in the past year? 0 0 0 No No  Number falls in past yr: - 0 0 - -   Is the patient's home free of loose throw rugs in walkways, pet beds, electrical cords, etc?   yes      Grab bars in the bathroom? yes      Handrails on the stairs?   yes      Adequate lighting?  yes  Patient rating of health (0-10) scale: 8  Depression Screen PHQ 2/9 Scores 03/30/2020 10/15/2019 06/08/2019 06/19/2018  PHQ - 2 Score 0 0 0 0    Cognitive Function  6CIT Screen 03/30/2020  What Year? 0 points  What month? 0 points  What time? 0 points  Count back from 20 0 points  Months in reverse 0 points  Repeat phrase 0 points  Total Score 0   Immunization History  Administered Date(s) Administered  . Influenza Whole 11/12/2007, 09/17/2008, 10/14/2009, 10/25/2010, 09/05/2011  . Influenza, High Dose Seasonal PF 08/20/2016, 08/30/2017  . Influenza-Unspecified 08/24/2013, 09/22/2014, 09/17/2018  . PFIZER SARS-COV-2 Vaccination 02/29/2020, 03/22/2020  . Pneumococcal Conjugate-13 06/22/2015, 02/17/2018  . Pneumococcal Polysaccharide-23 11/23/2004  . Tdap 09/05/2011, 03/06/2018  . Zoster 01/09/2018   Screening Tests Health Maintenance  Topic Date Due  . INFLUENZA VACCINE  07/24/2020  . TETANUS/TDAP  03/06/2028  . DEXA SCAN  Completed  . PNA vac Low Risk Adult  Completed   Cancer Screenings: Lung: Low Dose CT Chest recommended if Age 32-80 years, 30 pack-year currently smoking OR have quit w/in 15years. Patient does not qualify. Breast:  Up to date on Mammogram? Yes   Up to date of Bone Density/Dexa? Yes Colorectal: UTD  Plan:  Congratulations on receiving your Covid Vaccine! Start walking 3x per week 30  mins each time!  You can do this around your home or outside with a loved one or friend.  Bring in a copy of your advance directive so we can scan into your chart. PCP visit scheduled for May 17 @ 9:10am.  I have personally reviewed and noted the following in the patient's chart:   . Medical and social history . Use of alcohol, tobacco or illicit drugs  . Current medications and supplements . Functional ability and status . Nutritional status . Physical activity . Advanced directives . List of other physicians . Hospitalizations, surgeries, and ER visits in previous 12 months . Vitals . Screenings to include cognitive, depression, and falls . Referrals and appointments  In addition, I have reviewed and discussed with patient certain preventive protocols, quality metrics, and best practice recommendations. A written personalized care plan for preventive services as well as general preventive health recommendations were provided to patient.  This visit was conducted virtually in the setting of the [place] pandemic.    Dorna Bloom, [place]  03/31/2020    I have reviewed this visit and agree with the documentation.

## 2020-03-31 NOTE — Patient Instructions (Addendum)
You spoke to Kristina Carlson, [place] over the phone for your annual wellness visit.  We discussed goals: Goals    . Exercise 3x per week (30 min per time)     Walk around the perimeter of your home or outside with the nicer weather with a friend or loved one.       We also discussed recommended health maintenance. our office As discussed, you are up to date with everything!   Health Maintenance  Topic Date Due  . INFLUENZA VACCINE  07/24/2020  . TETANUS/TDAP  03/06/2028  . DEXA SCAN  Completed  . PNA vac Low Risk Adult  Completed   Congratulations on receiving your Covid Vaccine!  Start walking 3x per week 30 mins each time!  You can do this around your home or outside with a loved one or friend.  Bring in a copy of your advance directive so we can scan into your chart. PCP visit scheduled for May 17 @ 9:10am.  Preventive Care 82 Years and Older, Female Preventive care refers to lifestyle choices and visits with your health care provider that can promote health and wellness. This includes:  A yearly physical exam. This is also called an annual well check.  Regular dental and eye exams.  Immunizations.  Screening for certain conditions.  Healthy lifestyle choices, such as diet and exercise. What can I expect for my preventive care visit? Physical exam Your health care provider will check:  Height and weight. These may be used to calculate body mass index (BMI), which is a measurement that tells if you are at a healthy weight.  Heart rate and blood pressure.  Your skin for abnormal spots. Counseling Your health care provider may ask you questions about:  Alcohol, tobacco, and drug use.  Emotional well-being.  Home and relationship well-being.  Sexual activity.  Eating habits.  History of falls.  Memory and ability to understand (cognition).  Work and work Statistician.  Pregnancy and menstrual history. What immunizations do I need?  Influenza (flu)  vaccine  This is recommended every year. Tetanus, diphtheria, and pertussis (Tdap) vaccine  You may need a Td booster every 10 years. Varicella (chickenpox) vaccine  You may need this vaccine if you have not already been vaccinated. Zoster (shingles) vaccine  You may need this after age 34. Pneumococcal conjugate (PCV13) vaccine  One dose is recommended after age 16. Pneumococcal polysaccharide (PPSV23) vaccine  One dose is recommended after age 79. Measles, mumps, and rubella (MMR) vaccine  You may need at least one dose of MMR if you were born in 1957 or later. You may also need a second dose. Meningococcal conjugate (MenACWY) vaccine  You may need this if you have certain conditions. Hepatitis A vaccine  You may need this if you have certain conditions or if you travel or work in places where you may be exposed to hepatitis A. Hepatitis B vaccine  You may need this if you have certain conditions or if you travel or work in places where you may be exposed to hepatitis B. Haemophilus influenzae type b (Hib) vaccine  You may need this if you have certain conditions. You may receive vaccines as individual doses or as more than one vaccine together in one shot (combination vaccines). Talk with your health care provider about the risks and benefits of combination vaccines. What tests do I need? Blood tests  Lipid and cholesterol levels. These may be checked every 5 years, or more frequently depending on  your overall health.  Hepatitis C test.  Hepatitis B test. Screening  Lung cancer screening. You may have this screening every year starting at age 49 if you have a 30-pack-year history of smoking and currently smoke or have quit within the past 15 years.  Colorectal cancer screening. All adults should have this screening starting at age 74 and continuing until age 10. Your health care provider may recommend screening at age 58 if you are at increased risk. You will have  tests every 1-10 years, depending on your results and the type of screening test.  Diabetes screening. This is done by checking your blood sugar (glucose) after you have not eaten for a while (fasting). You may have this done every 1-3 years.  Mammogram. This may be done every 1-2 years. Talk with your health care provider about how often you should have regular mammograms.  BRCA-related cancer screening. This may be done if you have a family history of breast, ovarian, tubal, or peritoneal cancers. Other tests  Sexually transmitted disease (STD) testing.  Bone density scan. This is done to screen for osteoporosis. You may have this done starting at age 80. Follow these instructions at home: Eating and drinking  Eat a diet that includes fresh fruits and vegetables, whole grains, lean protein, and low-fat dairy products. Limit your intake of foods with high amounts of sugar, saturated fats, and salt.  Take vitamin and mineral supplements as recommended by your health care provider.  Do not drink alcohol if your health care provider tells you not to drink.  If you drink alcohol: ? Limit how much you have to 0-1 drink a day. ? Be aware of how much alcohol is in your drink. In the [place] one drink equals one 12 oz bottle of beer (355 mL), one 5 oz glass of wine (148 mL), or one 1 oz glass of hard liquor (44 mL). Lifestyle  Take daily care of your teeth and gums.  Stay active. Exercise for at least 30 minutes on 5 or more days each week.  Do not use any products that contain nicotine or tobacco, such as cigarettes, e-cigarettes, and chewing tobacco. If you need help quitting, ask your health care provider.  If you are sexually active, practice safe sex. Use a condom or other form of protection in order to prevent STIs (sexually transmitted infections).  Talk with your health care provider about taking a low-dose aspirin or statin. What's next?  Go to your health care provider once a  year for a well check visit.  Ask your health care provider how often you should have your eyes and teeth checked.  Stay up to date on all vaccines. This information is not intended to replace advice given to you by your health care provider. Make sure you discuss any questions you have with your health care provider. Document Revised: 12/04/2018 Document Reviewed: 12/04/2018 Elsevier Patient Education  2020 [place] clinic's number is [phone]. Please call with questions or concerns about what we discussed today.

## 2020-05-09 ENCOUNTER — Ambulatory Visit (INDEPENDENT_AMBULATORY_CARE_PROVIDER_SITE_OTHER): Payer: Medicare Other | Admitting: Family Medicine

## 2020-05-09 ENCOUNTER — Encounter: Payer: Self-pay | Admitting: Family Medicine

## 2020-05-09 ENCOUNTER — Other Ambulatory Visit: Payer: Self-pay

## 2020-05-09 VITALS — BP 124/80 | HR 87 | Ht 65.0 in | Wt 166.2 lb

## 2020-05-09 DIAGNOSIS — E039 Hypothyroidism, unspecified: Secondary | ICD-10-CM | POA: Diagnosis not present

## 2020-05-09 DIAGNOSIS — I10 Essential (primary) hypertension: Secondary | ICD-10-CM

## 2020-05-09 MED ORDER — LEVOTHYROXINE SODIUM 75 MCG PO TABS: ORAL_TABLET | ORAL | 3 refills | Status: DC

## 2020-05-09 NOTE — Patient Instructions (Signed)
Health Maintenance After Age 82 After age 82, you are at a higher risk for certain long-term diseases and infections as well as injuries from falls. Falls are a major cause of broken bones and head injuries in people who are older than age 82. Getting regular preventive care can help to keep you healthy and well. Preventive care includes getting regular testing and making lifestyle changes as recommended by your health care provider. Talk with your health care provider about:  Which screenings and tests you should have. A screening is a test that checks for a disease when you have no symptoms.  A diet and exercise plan that is right for you. What should I know about screenings and tests to prevent falls? Screening and testing are the best ways to find a health problem early. Early diagnosis and treatment give you the best chance of managing medical conditions that are common after age 82. Certain conditions and lifestyle choices may make you more likely to have a fall. Your health care provider may recommend:  Regular vision checks. Poor vision and conditions such as cataracts can make you more likely to have a fall. If you wear glasses, make sure to get your prescription updated if your vision changes.  Medicine review. Work with your health care provider to regularly review all of the medicines you are taking, including over-the-counter medicines. Ask your health care provider about any side effects that may make you more likely to have a fall. Tell your health care provider if any medicines that you take make you feel dizzy or sleepy.  Osteoporosis screening. Osteoporosis is a condition that causes the bones to get weaker. This can make the bones weak and cause them to break more easily.  Blood pressure screening. Blood pressure changes and medicines to control blood pressure can make you feel dizzy.  Strength and balance checks. Your health care provider may recommend certain tests to check your  strength and balance while standing, walking, or changing positions.  Foot health exam. Foot pain and numbness, as well as not wearing proper footwear, can make you more likely to have a fall.  Depression screening. You may be more likely to have a fall if you have a fear of falling, feel emotionally low, or feel unable to do activities that you used to do.  Alcohol use screening. Using too much alcohol can affect your balance and may make you more likely to have a fall. What actions can I take to lower my risk of falls? General instructions  Talk with your health care provider about your risks for falling. Tell your health care provider if: ? You fall. Be sure to tell your health care provider about all falls, even ones that seem minor. ? You feel dizzy, sleepy, or off-balance.  Take over-the-counter and prescription medicines only as told by your health care provider. These include any supplements.  Eat a healthy diet and maintain a healthy weight. A healthy diet includes low-fat dairy products, low-fat (lean) meats, and fiber from whole grains, beans, and lots of fruits and vegetables. Home safety  Remove any tripping hazards, such as rugs, cords, and clutter.  Install safety equipment such as grab bars in bathrooms and safety rails on stairs.  Keep rooms and walkways well-lit. Activity   Follow a regular exercise program to stay fit. This will help you maintain your balance. Ask your health care provider what types of exercise are appropriate for you.  If you need a cane or   walker, use it as recommended by your health care provider.  Wear supportive shoes that have nonskid soles. Lifestyle  Do not drink alcohol if your health care provider tells you not to drink.  If you drink alcohol, limit how much you have: ? 0-1 drink a day for women. ? 0-2 drinks a day for men.  Be aware of how much alcohol is in your drink. In the [place] one drink equals one typical bottle of beer (12  oz), one-half glass of wine (5 oz), or one shot of hard liquor (1 oz).  Do not use any products that contain nicotine or tobacco, such as cigarettes and e-cigarettes. If you need help quitting, ask your health care provider. Summary  Having a healthy lifestyle and getting preventive care can help to protect your health and wellness after age 82.  Screening and testing are the best way to find a health problem early and help you avoid having a fall. Early diagnosis and treatment give you the best chance for managing medical conditions that are more common for people who are older than age 82.  Falls are a major cause of broken bones and head injuries in people who are older than age 82. Take precautions to prevent a fall at home.  Work with your health care provider to learn what changes you can make to improve your health and wellness and to prevent falls. This information is not intended to replace advice given to you by your health care provider. Make sure you discuss any questions you have with your health care provider. Document Revised: 04/02/2019 Document Reviewed: 10/23/2017 Elsevier Patient Education  2020 Elsevier Inc.  

## 2020-05-09 NOTE — Assessment & Plan Note (Signed)
Well-controlled on current regimen.  Recent lipid panel obtained.  Will also obtain BMP today.  Continue current regimen.

## 2020-05-09 NOTE — Assessment & Plan Note (Signed)
We will repeat TSH today.  Refilled levothyroxine.

## 2020-05-09 NOTE — Progress Notes (Signed)
Subjective:  CC -- Annual Physical; With complaints of none  Pt reports she overall is doing well   Hypothyroidism She reports compliance with all her medications.  States she takes Synthroid daily.  No symptoms at this time.  Hypertension: - Medications: losartan 50mg  - Compliance: good  - Denies any SOB, CP, vision changes, LE edema, medication SEs, or symptoms of hypotension  Hyperlipidemia Meds: ASA 81mg , atorvastatin 40mg     General Healthcare: Medication Compliance: yes  Cardiac Risk as of  05/09/20(date):  The ASCVD Risk score Mikey Bussing DC Jr., et al., 2013) failed to calculate for the following reasons:   The 2013 ASCVD risk score is only valid for ages 23 to 8 Aspirin: yes   Dx Hypertension: yes  Dx Hyperlipidemia: yes  Diabetes: no  Dx Obesity: no  Weight Loss: no   Physical Activity: yes, but not much   Urinary Incontinence: yes, only when she has significant urge and is unable to get into the bathroom    Social: Driving: yes   Alcohol Use: no  Tobacco Use: no  Other Drugs: no  Independence: fully independent  Depression: no   - PHQ2 score:  Depression screen [facility] 2/9 05/09/2020 03/30/2020 10/15/2019 06/08/2019 06/19/2018  Decreased Interest 0 0 0 0 0  Down, Depressed, Hopeless 0 0 0 0 0  PHQ - 2 Score 0 0 0 0 0  Support and Life at Home: yes  Advanced Directives: yes   Cancer:   Colorectal >> Colonoscopy: yes  Lung >> Tobacco Use: no    - If so, previous Low-Dose CT screen: yes  Breast >> Mammogram: yes  Cervical/Endometrial >>  - Postmenopausal: yes - Hysterectomy: no  - Vaginal Bleeding: no - Pap Smear: yes  Skin >> Suspicious lesions: no   Other: Osteoporosis: no  Zoster Vaccine: yes  Flu Vaccine: yes  Pneumonia Vaccine: yes   Past Medical History Patient Active Problem List   Diagnosis Date Noted  . B12 deficiency 09/22/2018  . GERD (gastroesophageal reflux disease) 06/20/2016  . Osteopenia 06/20/2016  . Coronary artery disease 06/30/2014   . Primary hypertension 09/01/2012  . Vitamin D deficiency, unspecified 08/24/2010  . Familial multiple lipoprotein-type hyperlipidemia 03/08/2010  . Hypothyroidism 02/20/2007    Medications- reviewed and updated Current Outpatient Medications  Medication Sig Dispense Refill  . aspirin 81 MG EC tablet TAKE 1 TABLET (81 MG TOTAL) BY MOUTH DAILY. SWALLOW WHOLE. 90 tablet 3  . atorvastatin (LIPITOR) 40 MG tablet Take 1 tablet (40 mg total) by mouth daily. 90 tablet 3  . Calcium Carb-Cholecalciferol (CALCIUM 600+D3) 600-200 MG-UNIT TABS Take 1 tablet by mouth daily.     Marland Kitchen levothyroxine (SYNTHROID) 75 MCG tablet TAKE 1 TABLET BY MOUTH DAILY BEFORE BREAKFAST. TAKE 1 TABLET (75 MCG TOTAL) BY MOUTH DAILY. 90 tablet 3  . losartan (COZAAR) 50 MG tablet Take 1 tablet (50 mg total) by mouth daily. 90 tablet 3  . Multiple Vitamins-Minerals (OCUVITE PRESERVISION) TABS Take 1 tablet by mouth 2 (two) times a day.     . nitroGLYCERIN (NITROSTAT) 0.4 MG SL tablet PLACE 1 TABLET UNDER THE TONGUE EVERY 5 MINS AS NEEDED FOR CHEST PAIN. YOU MAY TAKE UP TO 3 CONSECUTIVE TABLETS. IF SYMPTOMS UNRELIEVED, SEEK MEDICAL ATTENTION. 25 tablet 1  . pantoprazole (PROTONIX) 40 MG tablet Take 1 tablet (40 mg total) by mouth daily. 30 tablet 0  . vitamin B-12 (CYANOCOBALAMIN) 1000 MCG tablet Take 1 tablet (1,000 mcg total) by mouth daily. 90 tablet 3  No current facility-administered medications for this visit.    Objective: BP 124/80   Pulse 87   Ht 5\' 5"  (1.651 m)   Wt 166 lb 3.2 oz (75.4 kg)   SpO2 99%   BMI 27.66 kg/m  Gen: NAD, alert, cooperative with exam HEENT: NCAT, EOMI, PERRL CV: RRR, good S1/S2, no murmur Resp: CTABL, no wheezes, non-labored Abd: Soft, Non Tender, Non Distended, BS present, no guarding or organomegaly Genital Exam: not indicated Ext: No edema, warm Neuro: Alert and oriented, No gross deficits   Assessment/Plan:  Primary hypertension Well-controlled on current regimen.  Recent  lipid panel obtained.  Will also obtain BMP today.  Continue current regimen.  Hypothyroidism We will repeat TSH today.  Refilled levothyroxine.   Orders Placed This Encounter  Procedures  . Basic Metabolic Panel  . TSH    Meds ordered this encounter  Medications  . levothyroxine (SYNTHROID) 75 MCG tablet    Sig: TAKE 1 TABLET BY MOUTH DAILY BEFORE BREAKFAST. TAKE 1 TABLET (75 MCG TOTAL) BY MOUTH DAILY.    Dispense:  90 tablet    Refill:  [place], DO, PGY-3 05/09/2020 8:13 PM

## 2020-05-10 LAB — BASIC METABOLIC PANEL
BUN/Creatinine Ratio: 14 (ref 12–28)
BUN: 14 mg/dL (ref 8–27)
CO2: 22 mmol/L (ref 20–29)
Calcium: 9.5 mg/dL (ref 8.7–10.3)
Chloride: 104 mmol/L (ref 96–106)
Creatinine, Ser: 1 mg/dL (ref 0.57–1.00)
GFR calc Af Amer: 61 mL/min/{1.73_m2} (ref >59)
GFR calc non Af Amer: 53 mL/min/{1.73_m2} — ABNORMAL LOW (ref >59)
Glucose: 93 mg/dL (ref 65–99)
Potassium: 4.6 mmol/L (ref 3.5–5.2)
Sodium: 142 mmol/L (ref 134–144)

## 2020-05-10 LAB — TSH: TSH: 1.09 u[IU]/mL (ref 0.450–4.500)

## 2020-06-06 ENCOUNTER — Other Ambulatory Visit: Payer: Self-pay | Admitting: Family Medicine

## 2020-06-06 DIAGNOSIS — E538 Deficiency of other specified B group vitamins: Secondary | ICD-10-CM

## 2020-06-25 ENCOUNTER — Other Ambulatory Visit: Payer: Self-pay | Admitting: Family Medicine

## 2020-06-25 DIAGNOSIS — I2510 Atherosclerotic heart disease of native coronary artery without angina pectoris: Secondary | ICD-10-CM

## 2020-08-18 DIAGNOSIS — H353133 Nonexudative age-related macular degeneration, bilateral, advanced atrophic without subfoveal involvement: Secondary | ICD-10-CM | POA: Diagnosis not present

## 2020-08-31 ENCOUNTER — Other Ambulatory Visit: Payer: Self-pay

## 2020-08-31 DIAGNOSIS — I10 Essential (primary) hypertension: Secondary | ICD-10-CM

## 2020-09-01 MED ORDER — LOSARTAN POTASSIUM 50 MG PO TABS: 50.0000 mg | ORAL_TABLET | Freq: Every day | ORAL | 3 refills | Status: DC

## 2021-01-17 DIAGNOSIS — Z20822 Contact with and (suspected) exposure to covid-19: Secondary | ICD-10-CM | POA: Diagnosis not present

## 2021-01-17 DIAGNOSIS — J069 Acute upper respiratory infection, unspecified: Secondary | ICD-10-CM | POA: Diagnosis not present

## 2021-01-25 ENCOUNTER — Ambulatory Visit (INDEPENDENT_AMBULATORY_CARE_PROVIDER_SITE_OTHER): Payer: Medicare Other | Admitting: Family Medicine

## 2021-01-25 ENCOUNTER — Other Ambulatory Visit: Payer: Self-pay

## 2021-01-25 ENCOUNTER — Encounter: Payer: Self-pay | Admitting: Family Medicine

## 2021-01-25 VITALS — BP 138/78 | HR 80 | Temp 97.8°F | Ht 63.5 in | Wt 167.5 lb

## 2021-01-25 DIAGNOSIS — K219 Gastro-esophageal reflux disease without esophagitis: Secondary | ICD-10-CM

## 2021-01-25 DIAGNOSIS — I2510 Atherosclerotic heart disease of native coronary artery without angina pectoris: Secondary | ICD-10-CM

## 2021-01-25 DIAGNOSIS — M8580 Other specified disorders of bone density and structure, unspecified site: Secondary | ICD-10-CM | POA: Diagnosis not present

## 2021-01-25 DIAGNOSIS — H9311 Tinnitus, right ear: Secondary | ICD-10-CM | POA: Diagnosis not present

## 2021-01-25 DIAGNOSIS — G8929 Other chronic pain: Secondary | ICD-10-CM | POA: Diagnosis not present

## 2021-01-25 DIAGNOSIS — I10 Essential (primary) hypertension: Secondary | ICD-10-CM

## 2021-01-25 DIAGNOSIS — M545 Low back pain, unspecified: Secondary | ICD-10-CM | POA: Diagnosis not present

## 2021-01-25 DIAGNOSIS — E039 Hypothyroidism, unspecified: Secondary | ICD-10-CM

## 2021-01-25 NOTE — Assessment & Plan Note (Signed)
Cont prn nexium use

## 2021-01-25 NOTE — Assessment & Plan Note (Signed)
Hearing exam normal. Discussed continued monitoring.

## 2021-01-25 NOTE — Patient Instructions (Addendum)
Return in 6 months for wellness    Tinnitus Tinnitus refers to hearing a sound when there is no actual source for that sound. This is often described as ringing in the ears. However, people with this condition may hear a variety of noises, in one ear or in both ears. The sounds of tinnitus can be soft, loud, or somewhere in between. Tinnitus can last for a few seconds or can be constant for days. It may go away without treatment and come back at various times. When tinnitus is constant or happens often, it can lead to other problems, such as trouble sleeping and trouble concentrating. Almost everyone experiences tinnitus at some point. Tinnitus that is long-lasting (chronic) or comes back often (recurs) may require medical attention. What are the causes? The cause of tinnitus is often not known. In some cases, it can result from:  Exposure to loud noises from machinery, music, or other sources.  An object (foreign body) stuck in the ear.  Earwax buildup.  Drinking alcohol or caffeine.  Taking certain medicines.  Age-related hearing loss. It may also be caused by medical conditions such as:  Ear or sinus infections.  High blood pressure.  Heart diseases.  Anemia.  Allergies.  Meniere's disease.  Thyroid problems.  Tumors.  A weak, bulging blood vessel (aneurysm) near the ear. What are the signs or symptoms? The main symptom of tinnitus is hearing a sound when there is no source for that sound. It may sound like:  Buzzing.  Roaring.  Ringing.  Blowing air.  Hissing.  Whistling.  Sizzling.  Humming.  Running water.  A musical note.  Tapping. Symptoms may affect only one ear (unilateral) or both ears (bilateral). How is this diagnosed? Tinnitus is diagnosed based on your symptoms, your medical history, and a physical exam. Your health care provider may do a thorough hearing test (audiologic exam) if your tinnitus:  Is unilateral.  Causes hearing  difficulties.  Lasts 6 months or longer. You may work with a health care provider who specializes in hearing disorders (audiologist). You may be asked questions about your symptoms and how they affect your daily life. You may have other tests done, such as:  CT scan.  MRI.  An imaging test of how blood flows through your blood vessels (angiogram). How is this treated? Treating an underlying medical condition can sometimes make tinnitus go away. If your tinnitus continues, other treatments may include:  Medicines.  Therapy and counseling to help you manage the stress of living with tinnitus.  Sound generators to mask the tinnitus. These include: ? Tabletop sound machines that play relaxing sounds to help you fall asleep. ? Wearable devices that fit in your ear and play sounds or music. ? Acoustic neural stimulation. This involves using headphones to listen to music that contains an auditory signal. Over time, listening to this signal may change some pathways in your brain and make you less sensitive to tinnitus. This treatment is used for very severe cases when no other treatment is working.  Using hearing aids or cochlear implants if your tinnitus is related to hearing loss. Hearing aids are worn in the outer ear. Cochlear implants are surgically placed in the inner ear. Follow these instructions at home: Managing symptoms  When possible, avoid being in loud places and being exposed to loud sounds.  Wear hearing protection, such as earplugs, when you are exposed to loud noises.  Use a white noise machine, a humidifier, or other devices to mask  the sound of tinnitus.  Practice techniques for reducing stress, such as meditation, yoga, or deep breathing. Work with your health care provider if you need help with managing stress.  Sleep with your head slightly raised. This may reduce the impact of tinnitus.      General instructions  Do not use stimulants, such as nicotine, alcohol,  or caffeine. Talk with your health care provider about other stimulants to avoid. Stimulants are substances that can make you feel alert and attentive by increasing certain activities in the body (such as heart rate and blood pressure). These substances may make tinnitus worse.  Take over-the-counter and prescription medicines only as told by your health care provider.  Try to get plenty of sleep each night.  Keep all follow-up visits as told by your health care provider. This is important. Contact a health care provider if:  Your tinnitus continues for 3 weeks or longer without stopping.  You develop sudden hearing loss.  Your symptoms get worse or do not get better with home care.  You feel you are not able to manage the stress of living with tinnitus. Get help right away if:  You develop tinnitus after a head injury.  You have tinnitus along with any of the following: ? Dizziness. ? Loss of balance. ? Nausea and vomiting. ? Sudden, severe headache. These symptoms may represent a serious problem that is an emergency. Do not wait to see if the symptoms will go away. Get medical help right away. Call your local emergency services (911 in the [place] Do not drive yourself to the hospital. Summary  Tinnitus refers to hearing a sound when there is no actual source for that sound. This is often described as ringing in the ears.  Symptoms may affect only one ear (unilateral) or both ears (bilateral).  Use a white noise machine, a humidifier, or other devices to mask the sound of tinnitus.  Do not use stimulants, such as nicotine, alcohol, or caffeine. Talk with your health care provider about other stimulants to avoid. These substances may make tinnitus worse. This information is not intended to replace advice given to you by your health care provider. Make sure you discuss any questions you have with your health care provider. Document Revised: 06/23/2019 Document Reviewed:  09/19/2017 Elsevier Patient Education  2021 [employer].

## 2021-01-25 NOTE — Assessment & Plan Note (Signed)
Cont calcium and vit D. Continue weight bearing activity

## 2021-01-25 NOTE — Assessment & Plan Note (Signed)
Well controlled. Cont losartan 50 mg.

## 2021-01-25 NOTE — Assessment & Plan Note (Signed)
Currently stable. Continue regular activity. XR with scoliosis

## 2021-01-25 NOTE — Assessment & Plan Note (Signed)
Doing well. Cont atorvastatin 40 mg and asa 81 mg

## 2021-01-25 NOTE — Assessment & Plan Note (Signed)
Lab Results  Component Value Date   TSH 1.090 05/09/2020   Last TSH normal. Cont levothyroxine 75 mcg

## 2021-01-25 NOTE — Progress Notes (Signed)
Subjective:     Kristina Carlson is a 85 y.o. female presenting for Establish Care     HPI  #Tinnitis - no hearing loss - just the right ear - sounds like crickets - does not notice when she is busy - not pulsatile   #chest pain - occurs a night - negative work-up -   Review of Systems   Social History   Tobacco Use  Smoking Status Never Smoker  Smokeless Tobacco Never Used        Objective:    BP Readings from Last 3 Encounters:  01/25/21 138/78  05/09/20 124/80  10/15/19 (!) 150/82   Wt Readings from Last 3 Encounters:  01/25/21 167 lb 8 oz (76 kg)  05/09/20 166 lb 3.2 oz (75.4 kg)  03/30/20 165 lb (74.8 kg)    BP 138/78   Pulse 80   Temp 97.8 F (36.6 C) (Temporal)   Ht 5' 3.5" (1.613 m)   Wt 167 lb 8 oz (76 kg)   SpO2 96%   BMI 29.21 kg/m    Physical Exam Constitutional:      General: She is not in acute distress.    Appearance: She is well-developed. She is not diaphoretic.  HENT:     Right Ear: External ear normal.     Left Ear: Tympanic membrane and external ear normal.     Ears:     Comments: Cerumen present obstructing view of TM, however, not impacted    Nose: Nose normal.  Eyes:     Conjunctiva/sclera: Conjunctivae normal.  Cardiovascular:     Rate and Rhythm: Normal rate and regular rhythm.     Heart sounds: No murmur heard.   Pulmonary:     Effort: Pulmonary effort is normal. No respiratory distress.     Breath sounds: Normal breath sounds. No wheezing.  Musculoskeletal:     Cervical back: Neck supple.  Skin:    General: Skin is warm and dry.     Capillary Refill: Capillary refill takes less than 2 seconds.  Neurological:     Mental Status: She is alert. Mental status is at baseline.  Psychiatric:        Mood and Affect: Mood normal.        Behavior: Behavior normal.       Hearing Screening   125Hz  250Hz  500Hz  1000Hz  2000Hz  3000Hz  4000Hz  6000Hz  8000Hz   Right ear:  20 20 20 20   0    Left ear:  20 20 20 20   0           Assessment & Plan:   Problem List Items Addressed This Visit      Cardiovascular and Mediastinum   Primary hypertension - Primary (Chronic)    Well controlled. Cont losartan 50 mg.       Coronary artery disease (Chronic)    Doing well. Cont atorvastatin 40 mg and asa 81 mg        Digestive   GERD (gastroesophageal reflux disease) (Chronic)    Cont prn nexium use        Endocrine   Hypothyroidism (Chronic)    Lab Results  Component Value Date   TSH 1.090 05/09/2020   Last TSH normal. Cont levothyroxine 75 mcg        Musculoskeletal and Integument   Osteopenia (Chronic)    Cont calcium and vit D. Continue weight bearing activity        Other   Chronic low back pain  Currently stable. Continue regular activity. XR with scoliosis      Tinnitus of right ear    Hearing exam normal. Discussed continued monitoring.           Return in about 6 months (around 07/25/2021) for wellness visit.  Lesleigh Noe, MD  This visit occurred during the SARS-CoV-2 public health emergency.  Safety protocols were in place, including screening questions prior to the visit, additional usage of staff PPE, and extensive cleaning of exam room while observing appropriate contact time as indicated for disinfecting solutions.

## 2021-02-24 DIAGNOSIS — H52223 Regular astigmatism, bilateral: Secondary | ICD-10-CM | POA: Diagnosis not present

## 2021-02-24 DIAGNOSIS — Z9841 Cataract extraction status, right eye: Secondary | ICD-10-CM | POA: Diagnosis not present

## 2021-02-24 DIAGNOSIS — H0288B Meibomian gland dysfunction left eye, upper and lower eyelids: Secondary | ICD-10-CM | POA: Diagnosis not present

## 2021-02-24 DIAGNOSIS — H353133 Nonexudative age-related macular degeneration, bilateral, advanced atrophic without subfoveal involvement: Secondary | ICD-10-CM | POA: Diagnosis not present

## 2021-02-24 DIAGNOSIS — H0288A Meibomian gland dysfunction right eye, upper and lower eyelids: Secondary | ICD-10-CM | POA: Diagnosis not present

## 2021-02-24 DIAGNOSIS — Z9842 Cataract extraction status, left eye: Secondary | ICD-10-CM | POA: Diagnosis not present

## 2021-05-01 ENCOUNTER — Other Ambulatory Visit: Payer: Self-pay | Admitting: Family Medicine

## 2021-06-01 ENCOUNTER — Telehealth: Payer: Self-pay

## 2021-06-01 NOTE — Telephone Encounter (Signed)
[facility] - Client TELEPHONE ADVICE RECORD [facility] Patient Name: Kristina Carlson ER Gender: Female DOB: [birth year] Age: 84 Y 55 M 26 D Return Phone Number: [phone] (Primary) Address: City/ State/ Zip: [place] [address] Client [facility] - Client Client Site [facility] [facility] - Day Physician Waunita Schooner- MD Contact Type Call Who Is Calling Patient / Member / Family / Caregiver Call Type Triage / Clinical Relationship To Patient Self Return Phone Number [phone] (Primary) Chief Complaint Numbness Reason for Call Symptomatic / Request for Health Information Initial Comment Having abdominal pain, nausea and numbness in legs. Translation No Nurse Assessment Nurse: Nyoka Cowden, RN, Tanika Date/Time (Eastern Time): 06/01/2021 1:21:45 PM Confirm and document reason for call. If symptomatic, describe symptoms. ---Having abdominal pain, nausea and numbness in legs. ongoing for a month Does the patient have any new or worsening symptoms? ---Yes Will a triage be completed? ---Yes Related visit to physician within the last 2 weeks? ---No Does the PT have any chronic conditions? (i.e. diabetes, asthma, this includes High risk factors for pregnancy, etc.) ---No Is this a behavioral health or substance abuse call? ---No Guidelines Guideline Title Affirmed Question Affirmed Notes Nurse Date/Time (Eastern Time) Nausea Nausea lasts > 1 week Nyoka Cowden, RN, Tanika 06/01/2021 1:22:33 PM Neurologic Deficit [1] Numbness or tingling on both sides of body AND [2] is a new symptom present > 24 hours Nyoka Cowden, RN, Tanika 06/01/2021 1:24:49 PM Disp. Time Eilene Ghazi Time) Disposition Final User 06/01/2021 1:24:26 PM SEE PCP WITHIN 3 DAYS Nyoka Cowden, RN, Ludger Nutting 06/01/2021 1:27:11 PM SEE PCP WITHIN 3 DAYS Yes Nyoka Cowden, RN, Tanika PLEASE NOTE: All timestamps contained within this report are represented as [place] Standard  Time. CONFIDENTIALTY NOTICE: This fax transmission is intended only for the addressee. It contains information that is legally privileged, confidential or otherwise protected from use or disclosure. If you are not the intended recipient, you are strictly prohibited from reviewing, disclosing, copying using or disseminating any of this information or taking any action in reliance on or regarding this information. If you have received this fax in error, please notify us immediately by telephone so that we can arrange for its return to [place]. Phone: [phone], Toll-Free: [phone], Fax: [phone] Page: 2 of 2 Call Id: [id number] Caller Disagree/Comply Comply Caller Understands Yes PreDisposition Call Doctor Care Advice Given Per Guideline SEE PCP WITHIN 3 DAYS: CARE ADVICE given per Nausea (Adult) guideline. * You become worse CALL BACK IF: * Gradually return to a normal diet. * Start with saltine crackers, white bread, rice, mashed potatoes, cereal, apple sauce etc. SEE PCP WITHIN 3 DAYS: CARE ADVICE given per Neurologic Deficit (Adult) guideline. * You become worse CALL BACK IF:

## 2021-06-01 NOTE — Telephone Encounter (Signed)
Contacted pt who c/o of abdominal pain that is "not that bad uncomfortable pain", nausea, some numbness in both legs and lower back  pain for over 1 month. She said she didn't make an apt sooner because she has been busy helping family. Pt denies any vomiting or diarrhea and is having BM and eating without problem. Pt denies any other symptoms.  Advised pt if any symptoms worsened or she developed any new symptoms to contact our office. Advised if she thought she needed seen before her apt on 6/14 to go to the [facility]. Advised of ER precautions. Pt verbalized understanding.

## 2021-06-01 NOTE — Telephone Encounter (Signed)
Noted and agree. 

## 2021-06-06 ENCOUNTER — Encounter: Payer: Self-pay | Admitting: Family Medicine

## 2021-06-06 ENCOUNTER — Other Ambulatory Visit: Payer: Self-pay

## 2021-06-06 ENCOUNTER — Ambulatory Visit (INDEPENDENT_AMBULATORY_CARE_PROVIDER_SITE_OTHER): Payer: Medicare Other | Admitting: Family Medicine

## 2021-06-06 VITALS — BP 118/70 | HR 74 | Temp 98.0°F | Ht 64.0 in | Wt 167.0 lb

## 2021-06-06 DIAGNOSIS — R202 Paresthesia of skin: Secondary | ICD-10-CM | POA: Diagnosis not present

## 2021-06-06 DIAGNOSIS — E7849 Other hyperlipidemia: Secondary | ICD-10-CM | POA: Diagnosis not present

## 2021-06-06 DIAGNOSIS — K219 Gastro-esophageal reflux disease without esophagitis: Secondary | ICD-10-CM

## 2021-06-06 DIAGNOSIS — I10 Essential (primary) hypertension: Secondary | ICD-10-CM

## 2021-06-06 DIAGNOSIS — E559 Vitamin D deficiency, unspecified: Secondary | ICD-10-CM

## 2021-06-06 DIAGNOSIS — E538 Deficiency of other specified B group vitamins: Secondary | ICD-10-CM | POA: Diagnosis not present

## 2021-06-06 DIAGNOSIS — L989 Disorder of the skin and subcutaneous tissue, unspecified: Secondary | ICD-10-CM

## 2021-06-06 DIAGNOSIS — E039 Hypothyroidism, unspecified: Secondary | ICD-10-CM

## 2021-06-06 LAB — CBC WITH DIFFERENTIAL/PLATELET
Basophils Absolute: 0 10*3/uL (ref 0.0–0.1)
Basophils Relative: 0.6 % (ref 0.0–3.0)
Eosinophils Absolute: 0.1 10*3/uL (ref 0.0–0.7)
Eosinophils Relative: 1.9 % (ref 0.0–5.0)
HCT: 41.6 % (ref 36.0–46.0)
Hemoglobin: 14.1 g/dL (ref 12.0–15.0)
Lymphocytes Relative: 24.6 % (ref 12.0–46.0)
Lymphs Abs: 1 10*3/uL (ref 0.7–4.0)
MCHC: 33.9 g/dL (ref 30.0–36.0)
MCV: 97.5 fl (ref 78.0–100.0)
Monocytes Absolute: 0.3 10*3/uL (ref 0.1–1.0)
Monocytes Relative: 8 % (ref 3.0–12.0)
Neutro Abs: 2.6 10*3/uL (ref 1.4–7.7)
Neutrophils Relative %: 64.9 % (ref 43.0–77.0)
Platelets: 204 10*3/uL (ref 150.0–400.0)
RBC: 4.27 Mil/uL (ref 3.87–5.11)
RDW: 13.3 % (ref 11.5–15.5)
WBC: 4 10*3/uL (ref 4.0–10.5)

## 2021-06-06 LAB — COMPREHENSIVE METABOLIC PANEL
ALT: 19 U/L (ref 0–35)
AST: 16 U/L (ref 0–37)
Albumin: 3.9 g/dL (ref 3.5–5.2)
Alkaline Phosphatase: 98 U/L (ref 39–117)
BUN: 16 mg/dL (ref 6–23)
CO2: 30 mEq/L (ref 19–32)
Calcium: 9.2 mg/dL (ref 8.4–10.5)
Chloride: 106 mEq/L (ref 96–112)
Creatinine, Ser: 0.91 mg/dL (ref 0.40–1.20)
GFR: 58.65 mL/min — ABNORMAL LOW (ref >60.00)
Glucose, Bld: 121 mg/dL — ABNORMAL HIGH (ref 70–99)
Potassium: 4.2 mEq/L (ref 3.5–5.1)
Sodium: 143 mEq/L (ref 135–145)
Total Bilirubin: 0.5 mg/dL (ref 0.2–1.2)
Total Protein: 6.5 g/dL (ref 6.0–8.3)

## 2021-06-06 LAB — VITAMIN D 25 HYDROXY (VIT D DEFICIENCY, FRACTURES): VITD: 40.82 ng/mL (ref 30.00–100.00)

## 2021-06-06 LAB — FERRITIN: Ferritin: 50.3 ng/mL (ref 10.0–291.0)

## 2021-06-06 LAB — LIPID PANEL
Cholesterol: 141 mg/dL (ref 0–200)
HDL: 33.5 mg/dL — ABNORMAL LOW (ref >39.00)
NonHDL: 107.63
Total CHOL/HDL Ratio: 4
Triglycerides: 221 mg/dL — ABNORMAL HIGH (ref 0.0–149.0)
VLDL: 44.2 mg/dL — ABNORMAL HIGH (ref 0.0–40.0)

## 2021-06-06 LAB — LDL CHOLESTEROL, DIRECT: Direct LDL: 84 mg/dL

## 2021-06-06 LAB — TSH: TSH: 0.46 u[IU]/mL (ref 0.35–4.50)

## 2021-06-06 LAB — VITAMIN B12: Vitamin B-12: 753 pg/mL (ref 211–911)

## 2021-06-06 MED ORDER — ESOMEPRAZOLE MAGNESIUM 40 MG PO PACK: 40.0000 mg | PACK | Freq: Every day | ORAL | 2 refills | Status: DC

## 2021-06-06 NOTE — Assessment & Plan Note (Signed)
BP well controlled. Cont losartan 50 mg

## 2021-06-06 NOTE — Patient Instructions (Signed)
Abdominal pain and nausea - take nexium daily for 2 weeks - if better - take for 6 weeks - if not better call    Numbness of legs - will get blood work today

## 2021-06-06 NOTE — Progress Notes (Signed)
Subjective:     Kristina Carlson is a 85 y.o. female presenting for Nausea (Mostly during the night ), Abdominal Pain ("Funny feeling" ), and Numbness (In legs )     Abdominal Pain This is a new problem. The current episode started more than 1 month ago. The onset quality is gradual. The problem occurs every several days. The problem has been waxing and waning. The pain is located in the periumbilical region. The pain is at a severity of 3/10. The quality of the pain is dull. The abdominal pain does not radiate. Associated symptoms include frequency and nausea. Pertinent negatives include no anorexia, constipation, diarrhea, hematochezia or vomiting. Nothing aggravates the pain. The pain is relieved by Nothing. She has tried nothing for the symptoms.    If heartburn symptoms will take nexium  #numbness of legs - off and on for years - seems to be getting worse - has not done anything to treat this - endorses lower back pain and sharp mid-line pain in the lower lumbar area x3 weeks but no change in numbness of the legs - right leg does hurt more when the back is bothering her  Review of Systems  Gastrointestinal:  Positive for abdominal pain and nausea. Negative for anorexia, constipation, diarrhea, hematochezia and vomiting.  Genitourinary:  Positive for frequency.    Social History   Tobacco Use  Smoking Status Never  Smokeless Tobacco Never        Objective:    BP Readings from Last 3 Encounters:  06/06/21 118/70  01/25/21 138/78  05/09/20 124/80   Wt Readings from Last 3 Encounters:  06/06/21 167 lb (75.8 kg)  01/25/21 167 lb 8 oz (76 kg)  05/09/20 166 lb 3.2 oz (75.4 kg)    BP 118/70   Pulse 74   Temp 98 F (36.7 C) (Temporal)   Ht 5\' 4"  (1.626 m)   Wt 167 lb (75.8 kg)   SpO2 96%   BMI 28.67 kg/m    Physical Exam Constitutional:      General: She is not in acute distress.    Appearance: She is well-developed. She is not diaphoretic.  HENT:     Right  Ear: External ear normal.     Left Ear: External ear normal.     Nose: Nose normal.  Eyes:     Conjunctiva/sclera: Conjunctivae normal.  Cardiovascular:     Rate and Rhythm: Normal rate and regular rhythm.  Pulmonary:     Effort: Pulmonary effort is normal. No respiratory distress.     Breath sounds: Normal breath sounds. No wheezing.  Abdominal:     General: Abdomen is flat. Bowel sounds are normal.     Palpations: Abdomen is soft.     Tenderness: There is no abdominal tenderness. There is no guarding or rebound.  Musculoskeletal:     Cervical back: Neck supple.     Comments: Back:  Inspection: no abnormalities Palpation: no spinous ttp or paraspinous ttp Straight leg raise: negative Strength normal  Skin:    General: Skin is warm and dry.     Capillary Refill: Capillary refill takes less than 2 seconds.  Neurological:     Mental Status: She is alert. Mental status is at baseline.  Psychiatric:        Mood and Affect: Mood normal.        Behavior: Behavior normal.          Assessment & Plan:   Problem List Items Addressed This  Visit       Cardiovascular and Mediastinum   Primary hypertension (Chronic)    BP well controlled. Cont losartan 50 mg       Relevant Orders   Comprehensive metabolic panel   CBC with Differential     Digestive   GERD (gastroesophageal reflux disease) - Primary (Chronic)    Wonder if nausea/abdominal pain related to reflux. Trial of daily nexium 40 mg. Call if not improving       Relevant Medications   esomeprazole (NEXIUM) 40 MG packet     Endocrine   Hypothyroidism (Chronic)    Lab Results  Component Value Date   TSH 1.090 05/09/2020  Will recheck labs. Cont levo 75 mcg         Other   Familial multiple lipoprotein-type hyperlipidemia (Chronic)   Relevant Orders   Lipid panel   Vitamin D deficiency, unspecified (Chronic)   Relevant Orders   Vitamin D, 25-hydroxy   B12 deficiency (Chronic)   Relevant Orders    Vitamin B12   CBC with Differential   Paresthesia of both lower extremities    Pt notes never any change but per chart review some improvement with Vit B12 replacement. Though now worsening. Will check labs for source. Back w/o signs of sciatic symptoms so doubt nerve related compression.        Relevant Orders   Vitamin B12   CBC with Differential   TSH   Ferritin   Other Visit Diagnoses     Skin lesion of left leg       Relevant Orders   Ambulatory referral to Dermatology        Return if symptoms worsen or fail to improve.  Lesleigh Noe, MD  This visit occurred during the SARS-CoV-2 public health emergency.  Safety protocols were in place, including screening questions prior to the visit, additional usage of staff PPE, and extensive cleaning of exam room while observing appropriate contact time as indicated for disinfecting solutions.

## 2021-06-06 NOTE — Assessment & Plan Note (Signed)
Wonder if nausea/abdominal pain related to reflux. Trial of daily nexium 40 mg. Call if not improving

## 2021-06-06 NOTE — Assessment & Plan Note (Signed)
Lab Results  Component Value Date   TSH 1.090 05/09/2020   Will recheck labs. Cont levo 75 mcg

## 2021-06-06 NOTE — Assessment & Plan Note (Signed)
Pt notes never any change but per chart review some improvement with Vit B12 replacement. Though now worsening. Will check labs for source. Back w/o signs of sciatic symptoms so doubt nerve related compression.

## 2021-06-07 ENCOUNTER — Other Ambulatory Visit: Payer: Self-pay | Admitting: Family Medicine

## 2021-06-07 DIAGNOSIS — Z1231 Encounter for screening mammogram for malignant neoplasm of breast: Secondary | ICD-10-CM

## 2021-06-08 ENCOUNTER — Other Ambulatory Visit: Payer: Self-pay

## 2021-06-08 ENCOUNTER — Ambulatory Visit (INDEPENDENT_AMBULATORY_CARE_PROVIDER_SITE_OTHER): Payer: Medicare Other

## 2021-06-08 DIAGNOSIS — Z0000 Encounter for general adult medical examination without abnormal findings: Secondary | ICD-10-CM

## 2021-06-08 NOTE — Progress Notes (Signed)
PCP notes:  Health Maintenance: Dexa- due   Abnormal Screenings: none   Patient concerns: none   Nurse concerns: none   Next PCP appt.: 07/26/2021 @ 9:20 am

## 2021-06-08 NOTE — Progress Notes (Signed)
Subjective:   Kristina Carlson is a 85 y.o. female who presents for Medicare Annual (Subsequent) preventive examination.  Review of Systems: N/A      I connected with the patient today by telephone and verified that I am speaking with the correct person using two identifiers. Location patient: home Location nurse: work Persons participating in the telephone visit: patient, nurse.   I discussed the limitations, risks, security and privacy concerns of performing an evaluation and management service by telephone and the availability of in person appointments. I also discussed with the patient that there may be a patient responsible charge related to this service. The patient expressed understanding and verbally consented to this telephonic visit.        Cardiac Risk Factors include: advanced age (>25men, >78 women);hypertension     Objective:    Today's Vitals   There is no height or weight on file to calculate BMI.  Advanced Directives 06/08/2021 03/30/2020 10/15/2019 06/08/2019 04/15/2019 06/19/2018 09/17/2017  Does Patient Have a Medical Advance Directive? Yes Yes No No Yes No Yes  Type of Paramedic of [place];Living will Living will;Healthcare Power of [place];Living will - Living will  Does patient want to make changes to medical advance directive? - - - - No - Patient declined - -  Copy of [place] in Chart? No - copy requested No - copy requested - - - - -  Would patient like information on creating a medical advance directive? - - No - Patient declined No - Patient declined - No - Patient declined -    Current Medications (verified) Outpatient Encounter Medications as of 06/08/2021  Medication Sig   aspirin 81 MG EC tablet TAKE 1 TABLET (81 MG TOTAL) BY MOUTH DAILY. SWALLOW WHOLE.   atorvastatin (LIPITOR) 40 MG tablet TAKE 1 TABLET BY MOUTH EVERY DAY   Calcium Carb-Cholecalciferol (CALCIUM 600+D3)  600-200 MG-UNIT TABS Take 1 tablet by mouth daily.    [employer] VITAMIN B12 1000 MCG tablet TAKE 1 TABLET BY MOUTH EVERY DAY   esomeprazole (NEXIUM) 40 MG packet Take 40 mg by mouth daily before breakfast.   levothyroxine (SYNTHROID) 75 MCG tablet TAKE 1 TABLET BY MOUTH DAILY BEFORE BREAKFAST.   losartan (COZAAR) 50 MG tablet Take 1 tablet (50 mg total) by mouth daily.   Multiple Vitamins-Minerals (OCUVITE PRESERVISION) TABS Take 1 tablet by mouth 2 (two) times a day.   No facility-administered encounter medications on file as of 06/08/2021.    Allergies (verified) Lisinopril   History: Past Medical History:  Diagnosis Date   Hyperlipidemia    Hypertension    Hypothyroid    Insomnia    Macular degeneration    -- Dr. Laurey Arrow (optometrist).  Taking Ocuvite.   Past Surgical History:  Procedure Laterality Date   LEFT HEART CATHETERIZATION WITH CORONARY ANGIOGRAM N/A 06/29/2014   Procedure: LEFT HEART CATHETERIZATION WITH CORONARY ANGIOGRAM;  Surgeon: Sinclair Grooms, MD;  Location: [facility] CATH LAB;  Service: Cardiovascular;  Laterality: N/A;   Family History  Problem Relation Age of Onset   Diabetes Brother    Stroke Father    Social History   Socioeconomic History   Marital status: Widowed    Spouse name: Not on file   Number of children: 1   Years of education: 12   Highest education level: High school graduate  Occupational History   Occupation: Retired   Tobacco Use   Smoking  status: Never   Smokeless tobacco: Never  Vaping Use   Vaping Use: Never used  Substance and Sexual Activity   Alcohol use: No   Drug use: No   Sexual activity: Not Currently  Other Topics Concern   Not on file  Social History Narrative   01/25/21   From: the area   Living: alone, has family nearby   Work: retired - Special educational needs teacher       Family: 1 child - Marya Amsler - 2 grandchildren and 2 great grandchildren      Enjoys: spend time with friends, reading      Exercise: not currently   Diet:  pretty good, healthy foods, 3 meals a day      Safety   Seat belts: Yes    Guns: No   Safe in relationships: Yes    Social Determinants of Radio broadcast assistant Strain: Low Risk    Difficulty of Paying Living Expenses: Not hard at all  Food Insecurity: No Food Insecurity   Worried About Charity fundraiser in the Last Year: Never true   [place] in the Last Year: Never true  Transportation Needs: No Transportation Needs   Lack of Transportation (Medical): No   Lack of Transportation (Non-Medical): No  Physical Activity: Inactive   Days of Exercise per Week: 0 days   Minutes of Exercise per Session: 0 min  Stress: No Stress Concern Present   Feeling of Stress : Not at all  Social Connections: Not on file    Tobacco Counseling Counseling given: Not Answered   Clinical Intake:  Pre-visit preparation completed: Yes  Pain : No/denies pain     Nutritional Risks: Nausea/ vomitting/ diarrhea Diabetes: No  How often do you need to have someone help you when you read instructions, pamphlets, or other written materials from your doctor or pharmacy?: 1 - Never What is the last grade level you completed in school?: 12th  Diabetic: No Nutrition Risk Assessment:  Has the patient had any N/V/D within the last 2 months?  Yes  nausea sometimes Does the patient have any non-healing wounds?  No  Has the patient had any unintentional weight loss or weight gain?  No   Diabetes:  Is the patient diabetic?  No  If diabetic, was a CBG obtained today?   N/A Did the patient bring in their glucometer from home?   N/A How often do you monitor your CBG's? N/A.   Financial Strains and Diabetes Management:  Are you having any financial strains with the device, your supplies or your medication?  N/A .  Does the patient want to be seen by Chronic Care Management for management of their diabetes?   N/A Would the patient like to be referred to a Nutritionist or for Diabetic  Management?   N/A   Interpreter Needed?: No  Information entered by :: CJohnson, LPN   Activities of Daily Living In your present state of health, do you have any difficulty performing the following activities: 06/08/2021  Hearing? N  Vision? Y  Difficulty concentrating or making decisions? N  Walking or climbing stairs? N  Dressing or bathing? N  Doing errands, shopping? N  Preparing Food and eating ? N  Using the Toilet? N  In the past six months, have you accidently leaked urine? N  Do you have problems with loss of bowel control? N  Managing your Medications? N  Managing your Finances? N  Housekeeping or managing your  Housekeeping? N  Some recent data might be hidden    Patient Care Team: Lesleigh Noe, MD as PCP - General (Family Medicine)  Indicate any recent Medical Services you may have received from other than [facility] providers in the past year (date may be approximate).     Assessment:   This is a routine wellness examination for Kristina Carlson.  Hearing/Vision screen Vision Screening - Comments:: Patient gets annual eye exams   Dietary issues and exercise activities discussed: Current Exercise Habits: The patient does not participate in regular exercise at present, Exercise limited by: None identified   Goals Addressed             This Visit's Progress    Patient Stated       06/08/2021, I will maintain and continue medications as prescribed.         Depression Screen PHQ 2/9 Scores 06/08/2021 01/25/2021 05/09/2020 03/30/2020 10/15/2019 06/08/2019 06/19/2018  PHQ - 2 Score 0 0 0 0 0 0 0  PHQ- 9 Score 0 - - - - - -    Fall Risk Fall Risk  06/08/2021 06/06/2021 03/30/2020 10/15/2019 06/08/2019  Falls in the past year? 0 0 0 0 0  Number falls in past yr: 0 0 - 0 0  Injury with Fall? 0 - - - -  Risk for fall due to : Medication side effect - - - -  Follow up Falls evaluation completed;Falls prevention discussed - - - -    FALL RISK PREVENTION PERTAINING TO THE  HOME:  Any stairs in or around the home? Yes  If so, are there any without handrails? No  Home free of loose throw rugs in walkways, pet beds, electrical cords, etc? Yes  Adequate lighting in your home to reduce risk of falls? Yes   ASSISTIVE DEVICES UTILIZED TO PREVENT FALLS:  Life alert? No  Use of a cane, walker or w/c? No  Grab bars in the bathroom? No  Shower chair or bench in shower? No  Elevated toilet seat or a handicapped toilet? No   TIMED UP AND GO:  Was the test performed?  N/A telephone visit .     Cognitive Function: MMSE - Mini Mental State Exam 06/08/2021  Not completed: Refused     Mini Cog  Mini-Cog screen was not completed. Patient refused. Maximum score is 22. A value of 0 denotes this part of the MMSE was not completed or the patient failed this part of the Mini-Cog screening.  6CIT Screen 03/30/2020  What Year? 0 points  What month? 0 points  What time? 0 points  Count back from 20 0 points  Months in reverse 0 points  Repeat phrase 0 points  Total Score 0    Immunizations Immunization History  Administered Date(s) Administered   Fluad Quad(high Dose 65+) 08/29/2019, 09/22/2020   Influenza Whole 11/12/2007, 09/17/2008, 10/14/2009, 10/25/2010, 09/05/2011   Influenza, High Dose Seasonal PF 08/20/2016, 08/30/2017, 08/30/2017, 09/17/2018   Influenza-Unspecified 08/24/2013, 09/22/2014, 09/17/2018   PFIZER(Purple Top)SARS-COV-2 Vaccination 02/29/2020, 03/22/2020, 10/13/2020   Pneumococcal Conjugate-13 06/22/2015, 02/17/2018, 02/17/2018   Pneumococcal Polysaccharide-23 11/23/2004   Tdap 09/05/2011, 02/28/2018, 03/06/2018   Zoster Recombinat (Shingrix) 05/26/2018, 07/26/2018   Zoster, Live 01/09/2018    TDAP status: Up to date  Flu Vaccine status: Up to date  Pneumococcal vaccine status: Up to date  Covid-19 vaccine status: Completed 3 vaccines. Discussed second booster.   Qualifies for Shingles Vaccine? Yes   Zostavax completed Yes    Shingrix Completed?:  Yes  Screening Tests Health Maintenance  Topic Date Due   COVID-19 Vaccine (4 - Booster for Pfizer series) 02/13/2021   INFLUENZA VACCINE  07/24/2021   TETANUS/TDAP  03/06/2028   DEXA SCAN  Completed   PNA vac Low Risk Adult  Completed   Zoster Vaccines- Shingrix  Completed   HPV VACCINES  Aged Out    Health Maintenance  Health Maintenance Due  Topic Date Due   COVID-19 Vaccine (4 - Booster for [place] series) 02/13/2021    Colorectal cancer screening: No longer required.   Mammogram status: No longer required due to age.  Bone Density status: due, will discuss with provider at next visit.   Lung Cancer Screening: (Low Dose CT Chest recommended if Age 71-80 years, 30 pack-year currently smoking OR have quit w/in 15years.) does not qualify.   Additional Screening:  Hepatitis C Screening: does not qualify; Completed N/A  Vision Screening: Recommended annual ophthalmology exams for early detection of glaucoma and other disorders of the eye. Is the patient up to date with their annual eye exam?  Yes  Who is the provider or what is the name of the office in which the patient attends annual eye exams? [facility] If pt is not established with a provider, would they like to be referred to a provider to establish care? No .   Dental Screening: Recommended annual dental exams for proper oral hygiene  Community Resource Referral / Chronic Care Management: CRR required this visit?  No   CCM required this visit?  No      Plan:     I have personally reviewed and noted the following in the patient's chart:   Medical and social history Use of alcohol, tobacco or illicit drugs  Current medications and supplements including opioid prescriptions.  Functional ability and status Nutritional status Physical activity Advanced directives List of other physicians Hospitalizations, surgeries, and ER visits in previous 12 months Vitals Screenings to include  cognitive, depression, and falls Referrals and appointments  In addition, I have reviewed and discussed with patient certain preventive protocols, quality metrics, and best practice recommendations. A written personalized care plan for preventive services as well as general preventive health recommendations were provided to patient.   Due to this being a telephonic visit, the after visit summary with patients personalized plan was offered to patient via office or my-chart. Patient preferred to pick up at office at next visit or via mychart.   Andrez Grime, LPN   0/09/2724

## 2021-06-08 NOTE — Patient Instructions (Signed)
[name] , Thank you for taking time to come for your Medicare Wellness Visit. I appreciate your ongoing commitment to your health goals. Please review the following plan we discussed and let me know if I can assist you in the future.   Screening recommendations/referrals: Colonoscopy: no longer required  Mammogram: no longer required  Bone Density: due, will discuss with provider at next visit  Recommended yearly ophthalmology/optometry visit for glaucoma screening and checkup Recommended yearly dental visit for hygiene and checkup  Vaccinations: Influenza vaccine: Up to date, completed 09/22/2020, due 07/2021 Pneumococcal vaccine: Completed series Tdap vaccine: Up to date, completed 02/24/2018, due 02/2028 Shingles vaccine: Completed series   Covid-19:3 vaccines completed. Discussed booster.  Advanced directives: Please bring a copy of your POA (Power of Attorney) and/or Living Will to your next appointment.   Conditions/risks identified: hypertension   Next appointment: Follow up in one year for your annual wellness visit    Preventive Care 83 Years and Older, Female Preventive care refers to lifestyle choices and visits with your health care provider that can promote health and wellness. What does preventive care include? A yearly physical exam. This is also called an annual well check. Dental exams once or twice a year. Routine eye exams. Ask your health care provider how often you should have your eyes checked. Personal lifestyle choices, including: Daily care of your teeth and gums. Regular physical activity. Eating a healthy diet. Avoiding tobacco and drug use. Limiting alcohol use. Practicing safe sex. Taking low-dose aspirin every day. Taking vitamin and mineral supplements as recommended by your health care provider. What happens during an annual well check? The services and screenings done by your health care provider during your annual well check will depend on your  age, overall health, lifestyle risk factors, and family history of disease. Counseling  Your health care provider may ask you questions about your: Alcohol use. Tobacco use. Drug use. Emotional well-being. Home and relationship well-being. Sexual activity. Eating habits. History of falls. Memory and ability to understand (cognition). Work and work Statistician. Reproductive health. Screening  You may have the following tests or measurements: Height, weight, and BMI. Blood pressure. Lipid and cholesterol levels. These may be checked every 5 years, or more frequently if you are over 83 years old. Skin check. Lung cancer screening. You may have this screening every year starting at age 83 if you have a 30-pack-year history of smoking and currently smoke or have quit within the past 15 years. Fecal occult blood test (FOBT) of the stool. You may have this test every year starting at age 83. Flexible sigmoidoscopy or colonoscopy. You may have a sigmoidoscopy every 5 years or a colonoscopy every 10 years starting at age 83. Hepatitis C blood test. Hepatitis B blood test. Sexually transmitted disease (STD) testing. Diabetes screening. This is done by checking your blood sugar (glucose) after you have not eaten for a while (fasting). You may have this done every 1-3 years. Bone density scan. This is done to screen for osteoporosis. You may have this done starting at age 83. Mammogram. This may be done every 1-2 years. Talk to your health care provider about how often you should have regular mammograms. Talk with your health care provider about your test results, treatment options, and if necessary, the need for more tests. Vaccines  Your health care provider may recommend certain vaccines, such as: Influenza vaccine. This is recommended every year. Tetanus, diphtheria, and acellular pertussis (Tdap, Td) vaccine. You may need  a Td booster every 10 years. Zoster vaccine. You may need this after  age 50. Pneumococcal 13-valent conjugate (PCV13) vaccine. One dose is recommended after age 42. Pneumococcal polysaccharide (PPSV23) vaccine. One dose is recommended after age 50. Talk to your health care provider about which screenings and vaccines you need and how often you need them. This information is not intended to replace advice given to you by your health care provider. Make sure you discuss any questions you have with your health care provider. Document Released: 01/06/2016 Document Revised: 08/29/2016 Document Reviewed: 10/11/2015 Elsevier Interactive Patient Education  2017 [place] Prevention in the Home Falls can cause injuries. They can happen to people of all ages. There are many things you can do to make your home safe and to help prevent falls. What can I do on the outside of my home? Regularly fix the edges of walkways and driveways and fix any cracks. Remove anything that might make you trip as you walk through a door, such as a raised step or threshold. Trim any bushes or trees on the path to your home. Use bright outdoor lighting. Clear any walking paths of anything that might make someone trip, such as rocks or tools. Regularly check to see if handrails are loose or broken. Make sure that both sides of any steps have handrails. Any raised decks and porches should have guardrails on the edges. Have any leaves, snow, or ice cleared regularly. Use sand or salt on walking paths during winter. Clean up any spills in your garage right away. This includes oil or grease spills. What can I do in the bathroom? Use night lights. Install grab bars by the toilet and in the tub and shower. Do not use towel bars as grab bars. Use non-skid mats or decals in the tub or shower. If you need to sit down in the shower, use a plastic, non-slip stool. Keep the floor dry. Clean up any water that spills on the floor as soon as it happens. Remove soap buildup in the tub or shower  regularly. Attach bath mats securely with double-sided non-slip rug tape. Do not have throw rugs and other things on the floor that can make you trip. What can I do in the bedroom? Use night lights. Make sure that you have a light by your bed that is easy to reach. Do not use any sheets or blankets that are too big for your bed. They should not hang down onto the floor. Have a firm chair that has side arms. You can use this for support while you get dressed. Do not have throw rugs and other things on the floor that can make you trip. What can I do in the kitchen? Clean up any spills right away. Avoid walking on wet floors. Keep items that you use a lot in easy-to-reach places. If you need to reach something above you, use a strong step stool that has a grab bar. Keep electrical cords out of the way. Do not use floor polish or wax that makes floors slippery. If you must use wax, use non-skid floor wax. Do not have throw rugs and other things on the floor that can make you trip. What can I do with my stairs? Do not leave any items on the stairs. Make sure that there are handrails on both sides of the stairs and use them. Fix handrails that are broken or loose. Make sure that handrails are as long as the stairways. Check  any carpeting to make sure that it is firmly attached to the stairs. Fix any carpet that is loose or worn. Avoid having throw rugs at the top or bottom of the stairs. If you do have throw rugs, attach them to the floor with carpet tape. Make sure that you have a light switch at the top of the stairs and the bottom of the stairs. If you do not have them, ask someone to add them for you. What else can I do to help prevent falls? Wear shoes that: Do not have high heels. Have rubber bottoms. Are comfortable and fit you well. Are closed at the toe. Do not wear sandals. If you use a stepladder: Make sure that it is fully opened. Do not climb a closed stepladder. Make sure that  both sides of the stepladder are locked into place. Ask someone to hold it for you, if possible. Clearly mark and make sure that you can see: Any grab bars or handrails. First and last steps. Where the edge of each step is. Use tools that help you move around (mobility aids) if they are needed. These include: Canes. Walkers. Scooters. Crutches. Turn on the lights when you go into a dark area. Replace any light bulbs as soon as they burn out. Set up your furniture so you have a clear path. Avoid moving your furniture around. If any of your floors are uneven, fix them. If there are any pets around you, be aware of where they are. Review your medicines with your doctor. Some medicines can make you feel dizzy. This can increase your chance of falling. Ask your doctor what other things that you can do to help prevent falls. This information is not intended to replace advice given to you by your health care provider. Make sure you discuss any questions you have with your health care provider. Document Released: 10/06/2009 Document Revised: 05/17/2016 Document Reviewed: 01/14/2015 Elsevier Interactive Patient Education  2017 [employer].

## 2021-06-12 ENCOUNTER — Other Ambulatory Visit: Payer: Self-pay

## 2021-06-12 DIAGNOSIS — E538 Deficiency of other specified B group vitamins: Secondary | ICD-10-CM

## 2021-06-12 MED ORDER — CYANOCOBALAMIN 1000 MCG PO TABS: 1000.0000 ug | ORAL_TABLET | Freq: Every day | ORAL | 3 refills | Status: DC

## 2021-06-16 ENCOUNTER — Telehealth: Payer: Self-pay

## 2021-06-16 ENCOUNTER — Ambulatory Visit
Admission: RE | Admit: 2021-06-16 | Discharge: 2021-06-16 | Disposition: A | Discharge status: Home / Self Care | Payer: Medicare Other | Source: Ambulatory Visit | Attending: Family Medicine | Admitting: Family Medicine

## 2021-06-16 ENCOUNTER — Other Ambulatory Visit: Payer: Self-pay

## 2021-06-16 DIAGNOSIS — W57XXXA Bitten or stung by nonvenomous insect and other nonvenomous arthropods, initial encounter: Secondary | ICD-10-CM | POA: Diagnosis not present

## 2021-06-16 DIAGNOSIS — Z1231 Encounter for screening mammogram for malignant neoplasm of breast: Secondary | ICD-10-CM

## 2021-06-16 DIAGNOSIS — L089 Local infection of the skin and subcutaneous tissue, unspecified: Secondary | ICD-10-CM | POA: Diagnosis not present

## 2021-06-16 DIAGNOSIS — S80869A Insect bite (nonvenomous), unspecified lower leg, initial encounter: Secondary | ICD-10-CM | POA: Diagnosis not present

## 2021-06-16 NOTE — Telephone Encounter (Signed)
[facility] - Client TELEPHONE ADVICE RECORD [facility] Patient Name: Kristina Carlson Gender: Female DOB: 1936-11-02 Age: 85 Y 57 M 10 D Return Phone Number: [phone] (Primary), [phone] (Secondary) Address: City/ State/ Zip: [place] [address] Client [facility] - Client Client Site [facility] Physician Waunita Schooner- MD Contact Type Call Who Is Calling Patient / Member / Family / Caregiver Call Type Triage / Clinical Caller Name Lynnie Koehler Relationship To Patient Grandchild Return Phone Number [phone] (Primary) Chief Complaint Skin Lesion - Moles/ Lumps/ Growths Reason for Call Symptomatic / Request for [place] states her grandmother has a round red bump on her leg that is red and itchy. Caller states it also has fluid in it and it did get bigger in overnight . Additional Comment Caller was transferred from office for triage since there is no appts. available for today . [facility] Not Listed [facility] Translation No Nurse Assessment Nurse: Windle Guard, RN, Olin Hauser Date/Time (Eastern Time): 06/16/2021 11:07:15 AM Confirm and document reason for call. If symptomatic, describe symptoms. ---Caller states she has a round, red bump on her leg that is itchy. Fluid filled and increasing in size. States causing mild pain. Does the patient have any new or worsening symptoms? ---Yes Will a triage be completed? ---Yes Related visit to physician within the last 2 weeks? ---No Does the PT have any chronic conditions? (i.e. diabetes, asthma, this includes High risk factors for pregnancy, etc.) ---Yes List chronic conditions. ---HTN, Hypothyroidism, Hyperlipidemia Is this a behavioral health or substance abuse call? ---No Guidelines Guideline Title Affirmed Question Affirmed Notes Nurse Date/Time (Eastern Time) Insect Bite [1] Red or  very tender (to touch) area AND [2] started over 24 hours after the bite Windle Guard, RN, Olin Hauser 06/16/2021 11:12:43 AM PLEASE NOTE: All timestamps contained within this report are represented as [place] Standard Time. CONFIDENTIALTY NOTICE: This fax transmission is intended only for the addressee. It contains information that is legally privileged, confidential or otherwise protected from use or disclosure. If you are not the intended recipient, you are strictly prohibited from reviewing, disclosing, copying using or disseminating any of this information or taking any action in reliance on or regarding this information. If you have received this fax in error, please notify us immediately by telephone so that we can arrange for its return to [place]. Phone: [phone], Toll-Free: [phone], Fax: [phone] Page: 2 of 2 Call Id: [id number] [place]. Time Eilene Ghazi Time) Disposition Final User 06/16/2021 11:14:30 AM See PCP within 24 Hours Yes Conner, RN, Otho Najjar Disagree/Comply Comply Caller Understands Yes PreDisposition Call Doctor Care Advice Given Per Guideline SEE PCP WITHIN 24 HOURS: * IF OFFICE WILL BE OPEN: You need to be examined within the next 24 hours. Call your doctor (or NP/PA) when the office opens and make an appointment. * IF OFFICE WILL BE CLOSED: You need to be seen within the next 24 hours. A clinic or an urgent care center is often a good source of care if your doctor's office is closed or you can't get an appointment. ANTIBIOTIC OINTMENT: * Put a small amount of antibiotic ointment on the area 3 times per day. * You can get this over-the-counter (OTC) at a drugstore. * Use Bacitracin ointment (OTC in [place] or Polysporin ointment (OTC in [place]) or one that you already have. * Cover the area with a clean gauze or an adhesive bandage (such as a Band-Aid).  CALL BACK IF: * Fever occurs * You become worse CARE ADVICE given per Insect Bite (Adult) guideline. Referrals GO TO  FACILITY OTHER - SPECIFY

## 2021-06-16 NOTE — Telephone Encounter (Signed)
Noted  

## 2021-06-16 NOTE — Telephone Encounter (Signed)
I spoke with Ander Purpura (DPR signed) and Ander Purpura is taking pt to [facility] in [place] for evaluation. Lauren will cb next wk with update on how pt is doing. Sending note to Dr Einar Pheasant and [facility] CMA.

## 2021-06-23 ENCOUNTER — Other Ambulatory Visit: Payer: Self-pay | Admitting: Family Medicine

## 2021-06-23 DIAGNOSIS — I2510 Atherosclerotic heart disease of native coronary artery without angina pectoris: Secondary | ICD-10-CM

## 2021-07-14 ENCOUNTER — Other Ambulatory Visit: Payer: Self-pay

## 2021-07-14 ENCOUNTER — Ambulatory Visit (INDEPENDENT_AMBULATORY_CARE_PROVIDER_SITE_OTHER): Payer: Medicare Other | Admitting: Family Medicine

## 2021-07-14 ENCOUNTER — Encounter: Payer: Self-pay | Admitting: Family Medicine

## 2021-07-14 VITALS — BP 130/80 | HR 77 | Temp 97.8°F | Ht 64.0 in | Wt 168.8 lb

## 2021-07-14 DIAGNOSIS — K219 Gastro-esophageal reflux disease without esophagitis: Secondary | ICD-10-CM

## 2021-07-14 MED ORDER — PANTOPRAZOLE SODIUM 40 MG PO TBEC: 40.0000 mg | DELAYED_RELEASE_TABLET | Freq: Every day | ORAL | 3 refills | Status: DC

## 2021-07-14 MED ORDER — ONDANSETRON 4 MG PO TBDP: 4.0000 mg | ORAL_TABLET | Freq: Three times a day (TID) | ORAL | 0 refills | Status: DC | PRN

## 2021-07-14 MED ORDER — FAMOTIDINE 20 MG PO TABS
20.0000 mg | ORAL_TABLET | Freq: Two times a day (BID) | ORAL | 0 refills | Status: DC
Start: 2021-07-14 — End: 2021-08-07

## 2021-07-14 NOTE — Assessment & Plan Note (Signed)
Avoid acidic foods ( tomatos, citris ( lemons, pineapple, orange)), caffeine, soda, chocolate, peppermint, alcohol. Start new presciption for pantoprazole 40 mg daily.  Add famotidine to start twice daily.  Small meal size, eat  several hours prior to laying down.

## 2021-07-14 NOTE — Progress Notes (Signed)
Patient ID: Kristina Carlson, female    DOB: 1936/11/28, 85 y.o.   MRN: [id number]  This visit was conducted in person.  BP 130/80   Pulse 77   Temp 97.8 F (36.6 C) (Temporal)   Ht '5\' 4"'$  (1.626 m)   Wt 168 lb 12 oz (76.5 kg)   SpO2 98%   BMI 28.97 kg/m    CC: Chief Complaint  Patient presents with   Heartburn    Seen by Dr. Einar Pheasant on 06/06/2021    Subjective:   HPI: DALETH IMMEL is a 85 y.o. female presenting on 07/14/2021 for Heartburn (Seen by Dr. Einar Pheasant on 06/06/2021)   Saw Dr.  Einar Pheasant on 06/06/2021 for similar issue ( nausea)..started on nexium 40 mg daily. CMET, cbc:  unremarkable   She reports  now nausea has progressed to burning  in chest and throat. Constant pain. Waking up at night nauseous.  No change with eating. No burping, does have a lot of gas.  No abdominal pain... feels extra hungry.  No trouble swallowing.  She did not fill the nexium 4 0 mg instead given cost. 900 dollars She has also add in last few days old pantoprazole 40 mg daily.  That has not helped any.   Dr. Paulita Fujita... EGD:      Relevant past medical, surgical, family and social history reviewed and updated as indicated. Interim medical history since our last visit reviewed. Allergies and medications reviewed and updated. Outpatient Medications Prior to Visit  Medication Sig Dispense Refill   aspirin 81 MG EC tablet TAKE 1 TABLET (81 MG TOTAL) BY MOUTH DAILY. SWALLOW WHOLE. 90 tablet 3   atorvastatin (LIPITOR) 40 MG tablet TAKE 1 TABLET BY MOUTH EVERY DAY 90 tablet 3   Calcium Carb-Cholecalciferol (CALCIUM 600+D3) 600-200 MG-UNIT TABS Take 1 tablet by mouth daily.      cyanocobalamin ([facility] VITAMIN B12) 1000 MCG tablet Take 1 tablet (1,000 mcg total) by mouth daily. 90 tablet 3   esomeprazole (NEXIUM) 40 MG packet Take 40 mg by mouth daily before breakfast. 30 each 2   levothyroxine (SYNTHROID) 75 MCG tablet TAKE 1 TABLET BY MOUTH DAILY BEFORE BREAKFAST. 90 tablet 0   losartan (COZAAR) 50 MG  tablet Take 1 tablet (50 mg total) by mouth daily. 90 tablet 3   Multiple Vitamins-Minerals (OCUVITE PRESERVISION) TABS Take 1 tablet by mouth 2 (two) times a day.     No facility-administered medications prior to visit.     Per HPI unless specifically indicated in ROS section below Review of Systems  Constitutional:  Negative for fatigue and fever.  HENT:  Negative for congestion.   Eyes:  Negative for pain.  Respiratory:  Negative for cough and shortness of breath.   Cardiovascular:  Negative for chest pain, palpitations and leg swelling.  Gastrointestinal:  Negative for abdominal pain.  Genitourinary:  Negative for dysuria and vaginal bleeding.  Musculoskeletal:  Negative for back pain.  Neurological:  Negative for syncope, light-headedness and headaches.  Psychiatric/Behavioral:  Negative for dysphoric mood.   Objective:  BP 130/80   Pulse 77   Temp 97.8 F (36.6 C) (Temporal)   Ht '5\' 4"'$  (1.626 m)   Wt 168 lb 12 oz (76.5 kg)   SpO2 98%   BMI 28.97 kg/m   Wt Readings from Last 3 Encounters:  07/14/21 168 lb 12 oz (76.5 kg)  06/06/21 167 lb (75.8 kg)  01/25/21 167 lb 8 oz (76 kg)  Physical Exam Constitutional:      General: She is not in acute distress.    Appearance: Normal appearance. She is well-developed. She is not ill-appearing or toxic-appearing.  HENT:     Head: Normocephalic.     Right Ear: Hearing, tympanic membrane, ear canal and external ear normal. Tympanic membrane is not erythematous, retracted or bulging.     Left Ear: Hearing, tympanic membrane, ear canal and external ear normal. Tympanic membrane is not erythematous, retracted or bulging.     Nose: No mucosal edema or rhinorrhea.     Right Sinus: No maxillary sinus tenderness or frontal sinus tenderness.     Left Sinus: No maxillary sinus tenderness or frontal sinus tenderness.     Mouth/Throat:     Pharynx: Uvula midline.  Eyes:     General: Lids are normal. Lids are everted, no foreign bodies  appreciated.     Conjunctiva/sclera: Conjunctivae normal.     Pupils: Pupils are equal, round, and reactive to light.  Neck:     Thyroid: No thyroid mass or thyromegaly.     Vascular: No carotid bruit.     Trachea: Trachea normal.  Cardiovascular:     Rate and Rhythm: Normal rate and regular rhythm.     Pulses: Normal pulses.     Heart sounds: Normal heart sounds, S1 normal and S2 normal. No murmur heard.   No friction rub. No gallop.  Pulmonary:     Effort: Pulmonary effort is normal. No tachypnea or respiratory distress.     Breath sounds: Normal breath sounds. No decreased breath sounds, wheezing, rhonchi or rales.  Abdominal:     General: Bowel sounds are normal.     Palpations: Abdomen is soft.     Tenderness: There is no abdominal tenderness.  Musculoskeletal:     Cervical back: Normal range of motion and neck supple.  Skin:    General: Skin is warm and dry.     Findings: No rash.  Neurological:     Mental Status: She is alert.  Psychiatric:        Mood and Affect: Mood is not anxious or depressed.        Speech: Speech normal.        Behavior: Behavior normal. Behavior is cooperative.        Thought Content: Thought content normal.        Judgment: Judgment normal.      Results for orders placed or performed in visit on 06/06/21  Vitamin B12  Result Value Ref Range   Vitamin B-12 753 211 - 911 pg/mL  Comprehensive metabolic panel  Result Value Ref Range   Sodium 143 135 - 145 mEq/L   Potassium 4.2 3.5 - 5.1 mEq/L   Chloride 106 96 - 112 mEq/L   CO2 30 19 - 32 mEq/L   Glucose, Bld 121 (H) 70 - 99 mg/dL   BUN 16 6 - 23 mg/dL   Creatinine, Ser 0.91 0.40 - 1.20 mg/dL   Total Bilirubin 0.5 0.2 - 1.2 mg/dL   Alkaline Phosphatase 98 39 - 117 U/L   AST 16 0 - 37 U/L   ALT 19 0 - 35 U/L   Total Protein 6.5 6.0 - 8.3 g/dL   Albumin 3.9 3.5 - 5.2 g/dL   GFR 58.65 (L) >60.00 mL/min   Calcium 9.2 8.4 - 10.5 mg/dL  CBC with Differential  Result Value Ref Range   WBC  4.0 4.0 - 10.5 K/uL   RBC  4.27 3.87 - 5.11 Mil/uL   Hemoglobin 14.1 12.0 - 15.0 g/dL   HCT 41.6 36.0 - 46.0 %   MCV 97.5 78.0 - 100.0 fl   MCHC 33.9 30.0 - 36.0 g/dL   RDW 13.3 11.5 - 15.5 %   Platelets 204.0 150.0 - 400.0 K/uL   Neutrophils Relative % 64.9 43.0 - 77.0 %   Lymphocytes Relative 24.6 12.0 - 46.0 %   Monocytes Relative 8.0 3.0 - 12.0 %   Eosinophils Relative 1.9 0.0 - 5.0 %   Basophils Relative 0.6 0.0 - 3.0 %   Neutro Abs 2.6 1.4 - 7.7 K/uL   Lymphs Abs 1.0 0.7 - 4.0 K/uL   Monocytes Absolute 0.3 0.1 - 1.0 K/uL   Eosinophils Absolute 0.1 0.0 - 0.7 K/uL   Basophils Absolute 0.0 0.0 - 0.1 K/uL  Lipid panel  Result Value Ref Range   Cholesterol 141 0 - 200 mg/dL   Triglycerides 221.0 (H) 0.0 - 149.0 mg/dL   HDL 33.50 (L) >39.00 mg/dL   VLDL 44.2 (H) 0.0 - 40.0 mg/dL   Total CHOL/HDL Ratio 4    NonHDL 107.63   TSH  Result Value Ref Range   TSH 0.46 0.35 - 4.50 uIU/mL  Vitamin D, 25-hydroxy  Result Value Ref Range   VITD 40.82 30.00 - 100.00 ng/mL  Ferritin  Result Value Ref Range   Ferritin 50.3 10.0 - 291.0 ng/mL  LDL cholesterol, direct  Result Value Ref Range   Direct LDL 84.0 mg/dL    This visit occurred during the SARS-CoV-2 public health emergency.  Safety protocols were in place, including screening questions prior to the visit, additional usage of staff PPE, and extensive cleaning of exam room while observing appropriate contact time as indicated for disinfecting solutions.   COVID 19 screen:  No recent travel or known exposure to COVID19 The patient denies respiratory symptoms of COVID 19 at this time. The importance of social distancing was discussed today.   Assessment and Plan    Problem List Items Addressed This Visit     GERD (gastroesophageal reflux disease) - Primary (Chronic)    Avoid acidic foods ( tomatos, citris ( lemons, pineapple, orange)), caffeine, soda, chocolate, peppermint, alcohol. Start new presciption for pantoprazole 40 mg  daily.  Add famotidine to start twice daily.  Small meal size, eat  several hours prior to laying down.        Relevant Medications   pantoprazole (PROTONIX) 40 MG tablet   famotidine (PEPCID) 20 MG tablet   ondansetron (ZOFRAN ODT) 4 MG disintegrating tablet   Meds ordered this encounter  Medications   pantoprazole (PROTONIX) 40 MG tablet    Sig: Take 1 tablet (40 mg total) by mouth daily.    Dispense:  30 tablet    Refill:  3   famotidine (PEPCID) 20 MG tablet    Sig: Take 1 tablet (20 mg total) by mouth 2 (two) times daily.    Dispense:  60 tablet    Refill:  0   ondansetron (ZOFRAN ODT) 4 MG disintegrating tablet    Sig: Take 1 tablet (4 mg total) by mouth every 8 (eight) hours as needed for nausea or vomiting.    Dispense:  20 tablet    Refill:  0     Eliezer Lofts, MD

## 2021-07-14 NOTE — Patient Instructions (Addendum)
Avoid acidic foods ( tomatos, citris ( lemons, pineapple, orange)), caffeine, soda, chocolate, peppermint, alcohol. Start new presciption for pantoprazole 40 mg daily.  Add famotidine to start twice daily.  Small meal size, eat  several hours prior to laying down.

## 2021-07-26 ENCOUNTER — Other Ambulatory Visit: Payer: Self-pay

## 2021-07-26 ENCOUNTER — Encounter: Payer: Self-pay | Admitting: Family Medicine

## 2021-07-26 ENCOUNTER — Ambulatory Visit (INDEPENDENT_AMBULATORY_CARE_PROVIDER_SITE_OTHER): Payer: Medicare Other | Admitting: Family Medicine

## 2021-07-26 VITALS — BP 126/70 | HR 93 | Temp 97.9°F | Ht 63.5 in | Wt 168.0 lb

## 2021-07-26 DIAGNOSIS — Z0000 Encounter for general adult medical examination without abnormal findings: Secondary | ICD-10-CM

## 2021-07-26 DIAGNOSIS — E039 Hypothyroidism, unspecified: Secondary | ICD-10-CM

## 2021-07-26 DIAGNOSIS — I10 Essential (primary) hypertension: Secondary | ICD-10-CM

## 2021-07-26 DIAGNOSIS — K219 Gastro-esophageal reflux disease without esophagitis: Secondary | ICD-10-CM | POA: Diagnosis not present

## 2021-07-26 MED ORDER — SUCRALFATE 1 G PO TABS: 1.0000 g | ORAL_TABLET | Freq: Three times a day (TID) | ORAL | 0 refills | Status: DC

## 2021-07-26 MED ORDER — LOSARTAN POTASSIUM 50 MG PO TABS: 50.0000 mg | ORAL_TABLET | Freq: Every day | ORAL | 3 refills | Status: DC

## 2021-07-26 MED ORDER — LEVOTHYROXINE SODIUM 75 MCG PO TABS: ORAL_TABLET | ORAL | 3 refills | Status: DC

## 2021-07-26 NOTE — Patient Instructions (Addendum)
#  Heartburn/nausea - start carafate 3 times a day - call in 1 week with update - referral to GI today   Your DEXA showed osteopenia.   This means that she is at risk for developing osteoporosis and have some signs of low bone mass.   Would recommend the following:   1) 800 units of Vitamin D daily 2) Get 1200 mg of elemental calcium --- this is best from your diet. Try to track how much calcium you get on a typical day. You could find ways to add more (dairy products, leafy greens). Take a supplement for whatever you don't typically get so you reach 1200 mg of calcium.  3) Physical activity (ideally weight bearing) - like walking briskly 30 minutes 5 days a week.

## 2021-07-26 NOTE — Assessment & Plan Note (Signed)
Worsening symptoms and no change with famotidine 20 mg bid and pantoprazole 40 mg daily. Add carafate. GI consult. Call next week and will increase pantoprazole to 40 mg BID if no improvement

## 2021-07-26 NOTE — Progress Notes (Signed)
Annual Exam   Chief Complaint:  Chief Complaint  Patient presents with   Annual Exam    History of Present Illness:  [name] is a 85 y.o. No obstetric history on file. who LMP was No LMP recorded. Patient is postmenopausal., presents today for her annual examination.     # nausea/heartburn - burning sensation at night - has been taking pepcid and pantoprazole - wakes up nauseous at nighttime  Nutrition She does not get adequate calcium and Vitamin D in her diet. Diet: lots of veggies lately, eats meats Exercise: not currently  Safety The patient wears seatbelts: yes.     The patient feels safe at home and in their relationships: yes.    GYN She is not sexually active.    Breast Cancer Screening (Age 106-74):  There is no FH of breast cancer. There is no FH of ovarian cancer. BRCA screening Not Indicated.  Last Mammogram: 05/2021 The patient does want a mammogram this year.       Social History   Tobacco Use  Smoking Status Never  Smokeless Tobacco Never    Lung Cancer Screening (Ages 08-67): not applicable   Weight Wt Readings from Last 3 Encounters:  07/26/21 168 lb (76.2 kg)  07/14/21 168 lb 12 oz (76.5 kg)  06/06/21 167 lb (75.8 kg)   Patient has high BMI  BMI Readings from Last 1 Encounters:  07/26/21 29.29 kg/m     Chronic disease screening Blood pressure monitoring:  BP Readings from Last 3 Encounters:  07/26/21 126/70  07/14/21 130/80  06/06/21 118/70    Lipid Monitoring: Indication for screening: age >8, obesity, diabetes, family hx, CV risk factors.  Lipid screening: Yes  Lab Results  Component Value Date   CHOL 141 06/06/2021   HDL 33.50 (L) 06/06/2021   LDLCALC 62 10/15/2019   LDLDIRECT 84.0 06/06/2021   TRIG 221.0 (H) 06/06/2021   CHOLHDL 4 06/06/2021     Diabetes Screening: age >104, overweight, family hx, PCOS, hx of gestational diabetes, at risk ethnicity Diabetes Screening screening: Not Indicated  Lab  Results  Component Value Date   HGBA1C 5.5 10/03/2016     Past Medical History:  Diagnosis Date   Hyperlipidemia    Hypertension    Hypothyroid    Insomnia    Macular degeneration    -- Dr. Laurey Arrow (optometrist).  Taking Ocuvite.    Past Surgical History:  Procedure Laterality Date   LEFT HEART CATHETERIZATION WITH CORONARY ANGIOGRAM N/A 06/29/2014   Procedure: LEFT HEART CATHETERIZATION WITH CORONARY ANGIOGRAM;  Surgeon: Sinclair Grooms, MD;  Location: [facility];  Service: Cardiovascular;  Laterality: N/A;    Prior to Admission medications   Medication Sig Start Date End Date Taking? Authorizing Provider  aspirin 81 MG EC tablet TAKE 1 TABLET (81 MG TOTAL) BY MOUTH DAILY. SWALLOW WHOLE. 06/04/17  Yes Archie Patten, MD  atorvastatin (LIPITOR) 40 MG tablet TAKE 1 TABLET BY MOUTH EVERY DAY 06/27/21  Yes Lesleigh Noe, MD  Calcium Carb-Cholecalciferol (CALCIUM 600+D3) 600-200 MG-UNIT TABS Take 1 tablet by mouth daily.    Yes [provider]  cyanocobalamin ([employer] VITAMIN B12) 1000 MCG tablet Take 1 tablet (1,000 mcg total) by mouth daily. 06/12/21  Yes Lesleigh Noe, MD  famotidine (PEPCID) 20 MG tablet Take 1 tablet (20 mg total) by mouth 2 (two) times daily. 07/14/21  Yes Bedsole, Amy E, MD  levothyroxine (SYNTHROID) 75 MCG tablet TAKE 1 TABLET BY  MOUTH DAILY BEFORE BREAKFAST. 05/01/21  Yes Lesleigh Noe, MD  losartan (COZAAR) 50 MG tablet Take 1 tablet (50 mg total) by mouth daily. 09/01/20  Yes Simmons-Robinson, Makiera, MD  Multiple Vitamins-Minerals (OCUVITE PRESERVISION) TABS Take 1 tablet by mouth 2 (two) times a day.   Yes [provider]  ondansetron (ZOFRAN ODT) 4 MG disintegrating tablet Take 1 tablet (4 mg total) by mouth every 8 (eight) hours as needed for nausea or vomiting. 07/14/21  Yes Bedsole, Amy E, MD  pantoprazole (PROTONIX) 40 MG tablet Take 1 tablet (40 mg total) by mouth daily. 07/14/21  Yes Bedsole, Amy E, MD    Allergies  Allergen  Reactions   Lisinopril Cough    Gynecologic History: No LMP recorded. Patient is postmenopausal.  Obstetric History: No obstetric history on file.  Social History   Socioeconomic History   Marital status: Widowed    Spouse name: Not on file   Number of children: 1   Years of education: 12   Highest education level: High school graduate  Occupational History   Occupation: Retired   Tobacco Use   Smoking status: Never   Smokeless tobacco: Never  Vaping Use   Vaping Use: Never used  Substance and Sexual Activity   Alcohol use: No   Drug use: No   Sexual activity: Not Currently  Other Topics Concern   Not on file  Social History Narrative   01/25/21   From: the area   Living: alone, has family nearby   Work: retired - Special educational needs teacher       Family: 1 child - Marya Amsler - 2 grandchildren and 2 great grandchildren      Enjoys: spend time with friends, reading      Exercise: not currently   Diet: pretty good, healthy foods, 3 meals a day      Safety   Seat belts: Yes    Guns: No   Safe in relationships: Yes    Social Determinants of Radio broadcast assistant Strain: Low Risk    Difficulty of Paying Living Expenses: Not hard at all  Food Insecurity: No Food Insecurity   Worried About Charity fundraiser in the Last Year: Never true   [place] in the Last Year: Never true  Transportation Needs: No Transportation Needs   Lack of Transportation (Medical): No   Lack of Transportation (Non-Medical): No  Physical Activity: Inactive   Days of Exercise per Week: 0 days   Minutes of Exercise per Session: 0 min  Stress: No Stress Concern Present   Feeling of Stress : Not at all  Social Connections: Not on file  Intimate Partner Violence: Not At Risk   Fear of Current or Ex-Partner: No   Emotionally Abused: No   Physically Abused: No   Sexually Abused: No    Family History  Problem Relation Age of Onset   Stroke Father    Diabetes Brother    Breast cancer Neg Hx      Review of Systems  Constitutional:  Negative for chills and fever.  HENT:  Negative for congestion and sore throat.   Eyes:  Negative for blurred vision and double vision.  Respiratory:  Negative for shortness of breath.   Cardiovascular:  Negative for chest pain.  Gastrointestinal:  Positive for heartburn and nausea. Negative for vomiting.  Genitourinary: Negative.   Musculoskeletal: Negative.  Negative for myalgias.  Skin:  Negative for rash.  Neurological:  Negative for dizziness  and headaches.  Endo/Heme/Allergies:  Does not bruise/bleed easily.  Psychiatric/Behavioral:  Negative for depression. The patient is not nervous/anxious.     Physical Exam BP 126/70   Pulse 93   Temp 97.9 F (36.6 C) (Temporal)   Ht 5' 3.5" (1.613 m)   Wt 168 lb (76.2 kg)   SpO2 95%   BMI 29.29 kg/m    BP Readings from Last 3 Encounters:  07/26/21 126/70  07/14/21 130/80  06/06/21 118/70      Physical Exam Constitutional:      General: She is not in acute distress.    Appearance: She is well-developed. She is not diaphoretic.  HENT:     Head: Normocephalic and atraumatic.     Right Ear: External ear normal.     Left Ear: External ear normal.     Nose: Nose normal.  Eyes:     General: No scleral icterus.    Extraocular Movements: Extraocular movements intact.     Conjunctiva/sclera: Conjunctivae normal.  Cardiovascular:     Rate and Rhythm: Normal rate and regular rhythm.     Heart sounds: No murmur heard. Pulmonary:     Effort: Pulmonary effort is normal. No respiratory distress.     Breath sounds: Normal breath sounds. No wheezing.  Abdominal:     General: Bowel sounds are normal. There is no distension.     Palpations: Abdomen is soft. There is no mass.     Tenderness: There is no abdominal tenderness. There is no guarding or rebound.  Musculoskeletal:        General: Normal range of motion.     Cervical back: Neck supple.  Lymphadenopathy:     Cervical: No cervical  adenopathy.  Skin:    General: Skin is warm and dry.     Capillary Refill: Capillary refill takes less than 2 seconds.  Neurological:     Mental Status: She is alert and oriented to person, place, and time.     Deep Tendon Reflexes: Reflexes normal.  Psychiatric:        Mood and Affect: Mood normal.        Behavior: Behavior normal.    Mini-Cog - 07/26/21 0933     Normal clock drawing test? yes    How many words correct? 3               Results:  PHQ-9:  Flowsheet Row Clinical Support from 06/08/2021 in [place] at [facility]  PHQ-9 Total Score 0         Assessment: 85 y.o. No obstetric history on file. female here for routine annual physical examination.  Plan: Problem List Items Addressed This Visit       Cardiovascular and Mediastinum   Primary hypertension (Chronic)   Relevant Medications   losartan (COZAAR) 50 MG tablet     Digestive   GERD (gastroesophageal reflux disease) (Chronic)    Worsening symptoms and no change with famotidine 20 mg bid and pantoprazole 40 mg daily. Add carafate. GI consult. Call next week and will increase pantoprazole to 40 mg BID if no improvement       Relevant Medications   sucralfate (CARAFATE) 1 g tablet   Other Relevant Orders   Ambulatory referral to Gastroenterology     Endocrine   Hypothyroidism (Chronic)   Relevant Medications   levothyroxine (SYNTHROID) 75 MCG tablet   Other Visit Diagnoses     Annual physical exam    -  Primary  Screening: -- Blood pressure screen normal -- cholesterol screening: will obtain -- Weight screening: overweight: continue to monitor -- Diabetes Screening: will obtain -- Nutrition: Encouraged healthy diet  The ASCVD Risk score Mikey Bussing DC Jr., et al., 2013) failed to calculate for the following reasons:   The 2013 ASCVD risk score is only valid for ages 22 to 35  -- Statin therapy for Age 49-75 with CVD risk >7.5%  Psych -- Depression screening (PHQ-9):   Flowsheet Row Clinical Support from 06/08/2021 in [place] at [facility]  PHQ-9 Total Score 0        Safety -- tobacco screening: not using -- alcohol screening:  low-risk usage. -- no evidence of domestic violence or intimate partner violence.   Cancer Screening -- pap smear not collected per ASCCP guidelines -- family history of breast cancer screening: done. not at high risk. -- Mammogram -  up to date -- Colon cancer (age 42+)-- not indicated  Immunizations Immunization History  Administered Date(s) Administered   Fluad Quad(high Dose 65+) 08/29/2019, 09/22/2020   Influenza Whole 11/12/2007, 09/17/2008, 10/14/2009, 10/25/2010, 09/05/2011   Influenza, High Dose Seasonal PF 08/20/2016, 08/30/2017, 08/30/2017, 09/17/2018   Influenza-Unspecified 08/24/2013, 09/22/2014, 09/17/2018   PFIZER(Purple Top)SARS-COV-2 Vaccination 02/29/2020, 03/22/2020, 10/13/2020   Pneumococcal Conjugate-13 06/22/2015, 02/17/2018, 02/17/2018   Pneumococcal Polysaccharide-23 11/23/2004   Tdap 09/05/2011, 02/28/2018, 03/06/2018   Zoster Recombinat (Shingrix) 05/26/2018, 07/26/2018   Zoster, Live 01/09/2018    -- flu vaccine up to date -- TDAP q10 years up to date -- Shingles (age >105) up to date -- PPSV-23 (19-64 with chronic disease or smoking) up to date -- PCV-13 (age >60) - one dose followed by PPSV-23 1 year later up to date -- Covid-19 Vaccine up to date   Encouraged healthy diet and exercise. Encouraged regular vision and dental care.    Lesleigh Noe, MD

## 2021-07-28 ENCOUNTER — Other Ambulatory Visit: Payer: Self-pay | Admitting: Family Medicine

## 2021-07-28 DIAGNOSIS — E039 Hypothyroidism, unspecified: Secondary | ICD-10-CM

## 2021-08-05 ENCOUNTER — Other Ambulatory Visit: Payer: Self-pay | Admitting: Family Medicine

## 2021-08-16 ENCOUNTER — Other Ambulatory Visit: Payer: Self-pay | Admitting: Family Medicine

## 2021-08-16 DIAGNOSIS — K219 Gastro-esophageal reflux disease without esophagitis: Secondary | ICD-10-CM

## 2021-08-16 NOTE — Telephone Encounter (Signed)
Left Vm for pt asking for an update on how this med is working for her.

## 2021-08-16 NOTE — Telephone Encounter (Signed)
Pt called back she stated that she see no difference since starting the medication. She said that she is just waiting on her GI appt

## 2021-09-01 ENCOUNTER — Other Ambulatory Visit: Payer: Self-pay | Admitting: Family Medicine

## 2021-09-01 DIAGNOSIS — H353133 Nonexudative age-related macular degeneration, bilateral, advanced atrophic without subfoveal involvement: Secondary | ICD-10-CM | POA: Diagnosis not present

## 2021-09-08 DIAGNOSIS — R1030 Lower abdominal pain, unspecified: Secondary | ICD-10-CM | POA: Diagnosis not present

## 2021-09-08 DIAGNOSIS — R1013 Epigastric pain: Secondary | ICD-10-CM | POA: Diagnosis not present

## 2021-09-08 DIAGNOSIS — K219 Gastro-esophageal reflux disease without esophagitis: Secondary | ICD-10-CM | POA: Diagnosis not present

## 2021-09-11 ENCOUNTER — Other Ambulatory Visit: Payer: Self-pay | Admitting: Physician Assistant

## 2021-09-11 DIAGNOSIS — R1030 Lower abdominal pain, unspecified: Secondary | ICD-10-CM

## 2021-09-29 ENCOUNTER — Ambulatory Visit
Admission: RE | Admit: 2021-09-29 | Discharge: 2021-09-29 | Disposition: A | Discharge status: Home / Self Care | Payer: Medicare Other | Source: Ambulatory Visit | Attending: Physician Assistant | Admitting: Physician Assistant

## 2021-09-29 ENCOUNTER — Other Ambulatory Visit: Payer: Self-pay

## 2021-09-29 DIAGNOSIS — R1030 Lower abdominal pain, unspecified: Secondary | ICD-10-CM

## 2021-09-29 DIAGNOSIS — K5730 Diverticulosis of large intestine without perforation or abscess without bleeding: Secondary | ICD-10-CM | POA: Diagnosis not present

## 2021-09-29 DIAGNOSIS — I700 Atherosclerosis of aorta: Secondary | ICD-10-CM | POA: Diagnosis not present

## 2021-09-29 MED ORDER — IOPAMIDOL (ISOVUE-300) INJECTION 61%
100.0000 mL | Freq: Once | INTRAVENOUS | Status: AC | PRN
  Administered 2021-09-29: 100 mL via INTRAVENOUS

## 2021-10-27 DIAGNOSIS — K219 Gastro-esophageal reflux disease without esophagitis: Secondary | ICD-10-CM | POA: Diagnosis not present

## 2021-10-27 DIAGNOSIS — R110 Nausea: Secondary | ICD-10-CM | POA: Diagnosis not present

## 2021-11-28 ENCOUNTER — Ambulatory Visit: Payer: Medicare Other | Admitting: Dermatology

## 2021-11-28 ENCOUNTER — Encounter: Payer: Self-pay | Admitting: Dermatology

## 2021-11-28 ENCOUNTER — Other Ambulatory Visit: Payer: Self-pay

## 2021-11-28 DIAGNOSIS — L570 Actinic keratosis: Secondary | ICD-10-CM | POA: Diagnosis not present

## 2021-11-28 DIAGNOSIS — L821 Other seborrheic keratosis: Secondary | ICD-10-CM

## 2021-11-28 DIAGNOSIS — L578 Other skin changes due to chronic exposure to nonionizing radiation: Secondary | ICD-10-CM

## 2021-11-28 DIAGNOSIS — L814 Other melanin hyperpigmentation: Secondary | ICD-10-CM

## 2021-11-28 DIAGNOSIS — D1800 Hemangioma unspecified site: Secondary | ICD-10-CM | POA: Diagnosis not present

## 2021-11-28 DIAGNOSIS — L820 Inflamed seborrheic keratosis: Secondary | ICD-10-CM | POA: Diagnosis not present

## 2021-11-28 NOTE — Progress Notes (Signed)
New Patient Visit  Subjective  Kristina Carlson is a 85 y.o. female who presents for the following: Skin Problem (New patient here today for spots at lower legs. Patient's PCP noticed a spot at left leg in July and recommend a dermatologist look at it. Patient advises the original spot at left lower leg does look better but she has noticed a few more and they sometimes itch. ).  No hx of skin cancer.   The following portions of the chart were reviewed this encounter and updated as appropriate:   Tobacco  Allergies  Meds  Problems  Med Hx  Surg Hx  Fam Hx      Review of Systems:  No other skin or systemic complaints except as noted in HPI or Assessment and Plan.  Objective  Well appearing patient in no apparent distress; mood and affect are within normal limits.  A focused examination was performed including right shoulder, lower legs, chest, face. Relevant physical exam findings are noted in the Assessment and Plan.  left pretibia x 4, right pretibia x 6 (10) Erythematous keratotic or waxy stuck-on papule or plaque.   Right Lower Medial Eyelid Erythematous thin papules/macules with gritty scale.    Assessment & Plan  Inflamed seborrheic keratosis left pretibia x 4, right pretibia x 6  Symptomatic with itch  Prior to procedure, discussed risks of blister formation, small wound, skin dyspigmentation, or rare scar following cryotherapy. Recommend Vaseline ointment to treated areas while healing.  Recheck on follow up.   Destruction of lesion - left pretibia x 4, right pretibia x 6  Destruction method: cryotherapy   Informed consent: discussed and consent obtained   Lesion destroyed using liquid nitrogen: Yes   Cryotherapy cycles:  2 Outcome: patient tolerated procedure well with no complications   Post-procedure details: wound care instructions given    AK (actinic keratosis) Right Lower Medial Eyelid  Prior to procedure, discussed risks of blister formation, small  wound, skin dyspigmentation, or rare scar following cryotherapy.   Recommend Vaseline ointment to treated areas while healing.  Actinic keratoses are precancerous spots that appear secondary to cumulative UV radiation exposure/sun exposure over time. They are chronic with expected duration over 1 year. A portion of actinic keratoses will progress to squamous cell carcinoma of the skin. It is not possible to reliably predict which spots will progress to skin cancer and so treatment is recommended to prevent development of skin cancer.  Recommend daily broad spectrum sunscreen SPF 30+ to sun-exposed areas, reapply every 2 hours as needed.  Recommend staying in the shade or wearing long sleeves, sun glasses (UVA+UVB protection) and wide brim hats (4-inch brim around the entire circumference of the hat). Call for new or changing lesions.    Destruction of lesion - Right Lower Medial Eyelid  Destruction method: cryotherapy   Informed consent: discussed and consent obtained   Lesion destroyed using liquid nitrogen: Yes   Cryotherapy cycles:  2 Outcome: patient tolerated procedure well with no complications   Post-procedure details: wound care instructions given    Lentigines - Scattered tan macules - Due to sun exposure - Benign-appering, observe - Recommend daily broad spectrum sunscreen SPF 30+ to sun-exposed areas, reapply every 2 hours as needed. - Call for any changes  Actinic Damage - chronic, secondary to cumulative UV radiation exposure/sun exposure over time - diffuse scaly erythematous macules with underlying dyspigmentation - Recommend daily broad spectrum sunscreen SPF 30+ to sun-exposed areas, reapply every 2 hours as needed.  -  Recommend staying in the shade or wearing long sleeves, sun glasses (UVA+UVB protection) and wide brim hats (4-inch brim around the entire circumference of the hat). - Call for new or changing lesions.  Hemangiomas - Red papules - Discussed benign  nature - Observe - Call for any changes  Seborrheic Keratoses - Stuck-on, waxy, tan-brown papules and/or plaques  - Benign-appearing - Discussed benign etiology and prognosis. - Observe - Call for any changes  Return for 4-8 weeks , ISK follow up.  Graciella Belton, RMA, am acting as scribe for Forest Gleason, MD .  Documentation: I have reviewed the above documentation for accuracy and completeness, and I agree with the above.  Forest Gleason, MD

## 2021-11-28 NOTE — Patient Instructions (Addendum)
Cryotherapy Aftercare  Wash gently with soap and water everyday.   Apply Vaseline and Band-Aid daily until healed.   Prior to procedure, discussed risks of blister formation, small wound, skin dyspigmentation, or rare scar following cryotherapy. Recommend Vaseline ointment to treated areas while healing.  If You Need Anything After Your Visit  If you have any questions or concerns for your doctor, please call our main line at [phone] and press option 4 to reach your doctor's medical assistant. If no one answers, please leave a voicemail as directed and we will return your call as soon as possible. Messages left after 4 pm will be answered the following business day.   You may also send [place] a message via [place]. We typically respond to MyChart messages within 1-2 business days.  For prescription refills, please ask your pharmacy to contact our office. Our fax number is [phone].  If you have an urgent issue when the clinic is closed that cannot wait until the next business day, you can page your doctor at the number below.    Please note that while we do our best to be available for urgent issues outside of office hours, we are not available 24/7.   If you have an urgent issue and are unable to reach [place], you may choose to seek medical care at your doctor's office, retail clinic, urgent care center, or emergency room.  If you have a medical emergency, please immediately call 911 or go to the emergency department.  Pager Numbers  - Dr. Nehemiah Massed: [phone]  - Dr. Laurence Ferrari: [phone]  - Dr. Nicole Kindred: [phone]  In the event of inclement weather, please call our main line at [phone] for an update on the status of any delays or closures.  Dermatology Medication Tips: Please keep the boxes that topical medications come in in order to help keep track of the instructions about where and how to use these. Pharmacies typically print the medication instructions only on the  boxes and not directly on the medication tubes.   If your medication is too expensive, please contact our office at [phone] option 4 or send [place] a message through [place].   We are unable to tell what your co-pay for medications will be in advance as this is different depending on your insurance coverage. However, we may be able to find a substitute medication at lower cost or fill out paperwork to get insurance to cover a needed medication.   If a prior authorization is required to get your medication covered by your insurance company, please allow [place] 1-2 business days to complete this process.  Drug prices often vary depending on where the prescription is filled and some pharmacies may offer cheaper prices.  The website www.goodrx.com contains coupons for medications through different pharmacies. The prices here do not account for what the cost may be with help from insurance (it may be cheaper with your insurance), but the website can give you the price if you did not use any insurance.  - You can print the associated coupon and take it with your prescription to the pharmacy.  - You may also stop by our office during regular business hours and pick up a GoodRx coupon card.  - If you need your prescription sent electronically to a different pharmacy, notify our office through [facility] or by phone at [phone] option 4.     Si Usted Necesita Algo Despus de Su Visita  Tambin puede enviarnos un mensaje a travs de  MyChart. Por lo general respondemos a los mensajes de MyChart en el transcurso de 1 a 2 das hbiles.  Para renovar recetas, por favor pida a su farmacia que se ponga en contacto con nuestra oficina. Harland Dingwall de fax es [place] [phone].  Si tiene un asunto urgente cuando la clnica est cerrada y que no puede esperar hasta el siguiente da hbil, puede llamar/localizar a su doctor(a) al nmero que aparece a continuacin.   Por favor, tenga en cuenta que  aunque hacemos todo lo posible para estar disponibles para asuntos urgentes fuera del horario de [place], no estamos disponibles las 24 horas del da, los 7 das de la [place].   Si tiene un problema urgente y no puede comunicarse con nosotros, puede optar por buscar atencin mdica  en el consultorio de su doctor(a), en una clnica privada, en un centro de atencin urgente o en una sala de emergencias.  Si tiene Engineering geologist, por favor llame inmediatamente al 911 o vaya a la sala de emergencias.  Nmeros de bper  - Dr. Nehemiah Massed: [phone]  - Dra. Moye: [phone]  - Dra. Nicole Kindred: [phone]  En caso de inclemencias del [place], por favor llame a Johnsie Kindred principal al [phone] para una actualizacin sobre el [place] de cualquier retraso o cierre.  Consejos para la medicacin en dermatologa: Por favor, guarde las cajas en las que vienen los medicamentos de uso tpico para ayudarle a seguir las instrucciones sobre dnde y cmo usarlos. Las farmacias generalmente imprimen las instrucciones del medicamento slo en las cajas y no directamente en los tubos del [place].   Si su medicamento es muy caro, por favor, pngase en contacto con Zigmund Daniel llamando al [phone] y presione la opcin 4 o envenos un mensaje a travs de Pharmacist, community.   No podemos decirle cul ser su copago por los medicamentos por adelantado ya que esto es diferente dependiendo de la cobertura de su seguro. Sin embargo, es posible que podamos encontrar un medicamento sustituto a Electrical engineer un formulario para que el seguro cubra el medicamento que se considera necesario.   Si se requiere una autorizacin previa para que su compaa de seguros [place] su medicamento, por favor permtanos de 1 a 2 das hbiles para completar este proceso.  Los precios de los medicamentos varan con frecuencia dependiendo del Environmental consultant de dnde se surte la receta y alguna farmacias pueden ofrecer precios ms  baratos.  El sitio web www.goodrx.com tiene cupones para medicamentos de Airline pilot. Los precios aqu no tienen en cuenta lo que podra costar con la ayuda del seguro (puede ser ms barato con su seguro), pero el sitio web puede darle el precio si no utiliz Research scientist (physical sciences).  - Puede imprimir el cupn correspondiente y llevarlo con su receta a la farmacia.  - Tambin puede pasar por nuestra oficina durante el horario de atencin regular y Charity fundraiser una tarjeta de cupones de GoodRx.  - Si necesita que su receta se enve electrnicamente a una farmacia diferente, informe a nuestra oficina a travs de MyChart de [place] o por telfono llamando al [phone] y presione la opcin 4.

## 2021-12-28 ENCOUNTER — Ambulatory Visit: Payer: Medicare Other | Admitting: Dermatology

## 2022-01-04 ENCOUNTER — Encounter: Payer: Self-pay | Admitting: Family

## 2022-01-04 ENCOUNTER — Ambulatory Visit (INDEPENDENT_AMBULATORY_CARE_PROVIDER_SITE_OTHER): Payer: Medicare Other | Admitting: Family

## 2022-01-04 ENCOUNTER — Ambulatory Visit
Admission: RE | Admit: 2022-01-04 | Discharge: 2022-01-04 | Disposition: A | Discharge status: Home / Self Care | Payer: Medicare Other | Source: Ambulatory Visit | Attending: Family | Admitting: Family

## 2022-01-04 ENCOUNTER — Other Ambulatory Visit: Payer: Self-pay

## 2022-01-04 VITALS — BP 166/72 | HR 94 | Temp 97.6°F | Ht 63.5 in | Wt 165.0 lb

## 2022-01-04 DIAGNOSIS — M79662 Pain in left lower leg: Secondary | ICD-10-CM | POA: Insufficient documentation

## 2022-01-04 DIAGNOSIS — K5900 Constipation, unspecified: Secondary | ICD-10-CM

## 2022-01-04 DIAGNOSIS — N8110 Cystocele, unspecified: Secondary | ICD-10-CM | POA: Diagnosis not present

## 2022-01-04 DIAGNOSIS — M62838 Other muscle spasm: Secondary | ICD-10-CM | POA: Diagnosis not present

## 2022-01-04 DIAGNOSIS — K5909 Other constipation: Secondary | ICD-10-CM | POA: Insufficient documentation

## 2022-01-04 LAB — BASIC METABOLIC PANEL
BUN: 12 mg/dL (ref 6–23)
CO2: 33 mEq/L — ABNORMAL HIGH (ref 19–32)
Calcium: 9.3 mg/dL (ref 8.4–10.5)
Chloride: 104 mEq/L (ref 96–112)
Creatinine, Ser: 0.97 mg/dL (ref 0.40–1.20)
GFR: 54.11 mL/min — ABNORMAL LOW (ref >60.00)
Glucose, Bld: 109 mg/dL — ABNORMAL HIGH (ref 70–99)
Potassium: 3.9 mEq/L (ref 3.5–5.1)
Sodium: 142 mEq/L (ref 135–145)

## 2022-01-04 LAB — MAGNESIUM: Magnesium: 2.2 mg/dL (ref 1.5–2.5)

## 2022-01-04 NOTE — Assessment & Plan Note (Signed)
Daily miralax and stool softener now as to avoid straining to avoid further prolapse. Pt to reach out to gi for earlier appt to investigate further.

## 2022-01-04 NOTE — Assessment & Plan Note (Signed)
Stat venous doppler u/s, pt to go to [facility] now. Pt aware. Stable upon leaving if any cp palp sob go to er.  Also r/o dehydration/electrolyte imbalance due to increased dulcolax the last few days. .. order placed for lab bmp and mag, pending results.

## 2022-01-04 NOTE — Progress Notes (Signed)
Established Patient Office Visit  Subjective:  Patient ID: Kristina Carlson, female    DOB: June 10, 1936  Age: 86 y.o. MRN: [id number]  CC:  Chief Complaint  Patient presents with   bladder drop   Constipation    HPI Kristina Carlson is here today with concerns.   She has had some trouble with her bowels, five days without bowel movement, three days of dulcolax, two days of milk of magnesia, finally with some relief last night and has had soft stool since with improved bowel movements. She thinks that she may have hurt her bladder, because she was straining a lot and felt a small mass in her vagina which she states she pushed back inside. In the past she has experienced this prior,and was seen by a doctor years ago but was told all was ok. She did have one another occurrence a few weeks ago, with a 4-5 day period of constipation which she also had to do the same course of treatment for relief.   Today with no abdominal pain, no nausea. Nausea typically at night time, taking nausea medication for chronic nausea. (She was seen in 8/22 for this and referred to gi as well as it seems to relate to her heartburn). She does state heartburn has improved, currently taking protonix 40 mg once daily and pepcid twice daily.   Denies urinary incontinence, does have increased urinary frequency but states this is normal for her. She often gets up at night as well , about three times  a night to pee.  She did see GI back in 10/22 for GERD/reflux symptoms. She was told to f/u with them in six months, no appt set quite yet.   Also, she mentioned that her left lower extremity ached all last night as well as the night before. The pain was constant, no longer with it today but mainly at night. Did hurt to walk on it when it was hurting, not at current. No cp palp or sob.   Past Medical History:  Diagnosis Date   Hyperlipidemia    Hypertension    Hypothyroid    Insomnia    Macular degeneration    -- Dr.  Laurey Arrow (optometrist).  Taking Ocuvite.    Past Surgical History:  Procedure Laterality Date   LEFT HEART CATHETERIZATION WITH CORONARY ANGIOGRAM N/A 06/29/2014   Procedure: LEFT HEART CATHETERIZATION WITH CORONARY ANGIOGRAM;  Surgeon: Sinclair Grooms, MD;  Location: [facility] CATH LAB;  Service: Cardiovascular;  Laterality: N/A;    Family History  Problem Relation Age of Onset   Stroke Father    Diabetes Brother    Breast cancer Neg Hx     Social History   Socioeconomic History   Marital status: Widowed    Spouse name: Not on file   Number of children: 1   Years of education: 12   Highest education level: High school graduate  Occupational History   Occupation: Retired   Tobacco Use   Smoking status: Never   Smokeless tobacco: Never  Vaping Use   Vaping Use: Never used  Substance and Sexual Activity   Alcohol use: No   Drug use: No   Sexual activity: Not Currently  Other Topics Concern   Not on file  Social History Narrative   01/25/21   From: the area   Living: alone, has family nearby   Work: retired [employer] fast streamer       Family: 1 child - Marya Amsler - 2  grandchildren and 2 great grandchildren      Enjoys: spend time with friends, reading      Exercise: not currently   Diet: pretty good, healthy foods, 3 meals a day      Safety   Seat belts: Yes    Guns: No   Safe in relationships: Yes    Social Determinants of Radio broadcast assistant Strain: Low Risk    Difficulty of Paying Living Expenses: Not hard at all  Food Insecurity: No Food Insecurity   Worried About Charity fundraiser in the Last Year: Never true   [place] in the Last Year: Never true  Transportation Needs: No Transportation Needs   Lack of Transportation (Medical): No   Lack of Transportation (Non-Medical): No  Physical Activity: Inactive   Days of Exercise per Week: 0 days   Minutes of Exercise per Session: 0 min  Stress: No Stress Concern Present    Feeling of Stress : Not at all  Social Connections: Not on file  Intimate Partner Violence: Not At Risk   Fear of Current or Ex-Partner: No   Emotionally Abused: No   Physically Abused: No   Sexually Abused: No    Outpatient Medications Prior to Visit  Medication Sig Dispense Refill   aspirin 81 MG EC tablet TAKE 1 TABLET (81 MG TOTAL) BY MOUTH DAILY. SWALLOW WHOLE. 90 tablet 3   atorvastatin (LIPITOR) 40 MG tablet TAKE 1 TABLET BY MOUTH EVERY DAY 90 tablet 3   Calcium Carb-Cholecalciferol (CALCIUM 600+D3) 600-200 MG-UNIT TABS Take 1 tablet by mouth daily.      cyanocobalamin ([facility] VITAMIN B12) 1000 MCG tablet Take 1 tablet (1,000 mcg total) by mouth daily. 90 tablet 3   famotidine (PEPCID) 20 MG tablet TAKE 1 TABLET BY MOUTH TWICE A DAY 60 tablet 0   levothyroxine (SYNTHROID) 75 MCG tablet TAKE 1 TABLET BY MOUTH DAILY BEFORE BREAKFAST. 90 tablet 3   losartan (COZAAR) 50 MG tablet Take 1 tablet (50 mg total) by mouth daily. 90 tablet 3   Multiple Vitamins-Minerals (OCUVITE PRESERVISION) TABS Take 1 tablet by mouth 2 (two) times a day.     ondansetron (ZOFRAN ODT) 4 MG disintegrating tablet Take 1 tablet (4 mg total) by mouth every 8 (eight) hours as needed for nausea or vomiting. 20 tablet 0   pantoprazole (PROTONIX) 40 MG tablet Take 1 tablet (40 mg total) by mouth daily. 30 tablet 3   sucralfate (CARAFATE) 1 g tablet TAKE 1 TABLET (1 G TOTAL) BY MOUTH 4 (FOUR) TIMES DAILY - WITH MEALS AND AT BEDTIME. (Patient not taking: Reported on 01/04/2022) 90 tablet 0   No facility-administered medications prior to visit.    Allergies  Allergen Reactions   Lisinopril Cough    ROS Review of Systems  Constitutional:  Negative for chills and fatigue.  Respiratory:  Negative for cough and shortness of breath.   Cardiovascular:  Negative for chest pain and leg swelling.  Gastrointestinal:  Positive for constipation (relieved last night after taking medication) and nausea (chronic).  Negative for abdominal pain, diarrhea and vomiting.       Heartburn, although seems to be improving   Genitourinary:  Negative for difficulty urinating, dysuria, frequency, hematuria and urgency. Genital sores: not abnormal.      Mass palpated in vagina, soft, squishy per pt, able to push back in   Musculoskeletal:  Positive for arthralgias (lle leg pain, aching worse at night.).  Psychiatric/Behavioral:  Negative for  agitation and sleep disturbance.   All other systems reviewed and are negative.    Objective:    Physical Exam Vitals reviewed.  Constitutional:      General: She is not in acute distress.    Appearance: Normal appearance. She is normal weight. She is not ill-appearing, toxic-appearing or diaphoretic.  HENT:     Head: Normocephalic.  Cardiovascular:     Rate and Rhythm: Normal rate and regular rhythm.  Pulmonary:     Effort: Pulmonary effort is normal.     Breath sounds: Normal breath sounds.  Genitourinary:    Pubic Area: No rash.      Labia:        Right: No rash, tenderness, lesion or injury.        Left: No rash, tenderness, lesion or injury.      Urethra: No prolapse, urethral pain or urethral swelling.     Vagina: Prolapsed vaginal walls (anterior walls, able to reduce with leaking of urine) present.     Rectum: Normal. No tenderness, anal fissure or external hemorrhoid.  Musculoskeletal:        General: Tenderness present. No swelling.     Left lower leg: No edema (lle lateral shin/calf).     Comments: Positive homans lle Tenderness noted in left posterior knee on homans   Neurological:     General: No focal deficit present.     Mental Status: She is alert and oriented to person, place, and time.  Psychiatric:        Mood and Affect: Mood normal.        Behavior: Behavior normal.        Thought Content: Thought content normal.        Judgment: Judgment normal.    BP (!) 166/72    Pulse 94    Temp 97.6 F (36.4 C) (Temporal)    Ht 5' 3.5" (1.613  m)    Wt 165 lb (74.8 kg)    SpO2 95%    BMI 28.77 kg/m  Wt Readings from Last 3 Encounters:  01/04/22 165 lb (74.8 kg)  07/26/21 168 lb (76.2 kg)  07/14/21 168 lb 12 oz (76.5 kg)     There are no preventive care reminders to display for this patient.  There are no preventive care reminders to display for this patient.  Lab Results  Component Value Date   TSH 0.46 06/06/2021   Lab Results  Component Value Date   WBC 4.0 06/06/2021   HGB 14.1 06/06/2021   HCT 41.6 06/06/2021   MCV 97.5 06/06/2021   PLT 204.0 06/06/2021   Lab Results  Component Value Date   NA 143 06/06/2021   K 4.2 06/06/2021   CO2 30 06/06/2021   GLUCOSE 121 (H) 06/06/2021   BUN 16 06/06/2021   CREATININE 0.91 06/06/2021   BILITOT 0.5 06/06/2021   ALKPHOS 98 06/06/2021   AST 16 06/06/2021   ALT 19 06/06/2021   PROT 6.5 06/06/2021   ALBUMIN 3.9 06/06/2021   CALCIUM 9.2 06/06/2021   ANIONGAP 12 04/15/2019   GFR 58.65 (L) 06/06/2021   Lab Results  Component Value Date   HGBA1C 5.5 10/03/2016      Assessment & Plan:   Problem List Items Addressed This Visit       Genitourinary   Female bladder prolapse - Primary    Referred to urogyn, pt advised to avoid straining. If increased pain/pressure pt to go to hospital. If unable to reduce, pt to  go to er.       Relevant Orders   Ambulatory referral to Urogynecology     Other   Muscle spasm of left lower extremity   Relevant Orders   Magnesium   Basic metabolic panel   Pain in left lower leg    Stat venous doppler u/s, pt to go to [facility] now. Pt aware. Stable upon leaving if any cp palp sob go to er.  Also r/o dehydration/electrolyte imbalance due to increased dulcolax the last few days. .. order placed for lab bmp and mag, pending results.       Relevant Orders   US Venous Img Lower Unilateral Left (DVT)   Magnesium   Basic metabolic panel   Constipation    Daily miralax and stool softener now as to avoid straining to  avoid further prolapse. Pt to reach out to gi for earlier appt to investigate further.       No orders of the defined types were placed in this encounter.   Follow-up: Return in about 3 months (around 04/04/2022) for with pcp.    Eugenia Pancoast, FNP

## 2022-01-04 NOTE — Patient Instructions (Addendum)
Please call your GI doctor and schedule a sooner appointment than in the next 3 months as you are still continuing to suffer from constipation and occasional heartburn nausea.  I do recommend that for The constipation you start MiraLAX daily which hopefully will help soften the stool and avoid any unnecessary straining as to not worsen your bladder prolapse.  Please also continue with your daily Colace stool softener supplement.  I have referred you to urogynecology you should hear in the next few days as far as an appointment time, if you do not please let us know.  Stop by the lab prior to leaving today. I will notify you of your results once received.  Stat u/s ordered for lower extremity pain, pt to go to [facility] for this. Pt aware. If any chest pain or shortness of breath go to the hospital/er  Please try to avoid any sort of straining, to include constipation and/or lifting weights as this can worsen your condition.  If you start having any pelvic pain and/or intense pressure please go to the emergency room.  It was a pleasure seeing you today! Please do not hesitate to reach out with any questions and or concerns.  Regards,   Eugenia Pancoast FNP-C

## 2022-01-04 NOTE — Assessment & Plan Note (Signed)
Referred to urogyn, pt advised to avoid straining. If increased pain/pressure pt to go to hospital. If unable to reduce, pt to go to er.

## 2022-01-17 ENCOUNTER — Ambulatory Visit: Payer: Medicare Other | Admitting: Dermatology

## 2022-01-17 ENCOUNTER — Other Ambulatory Visit: Payer: Self-pay

## 2022-01-17 DIAGNOSIS — Z872 Personal history of diseases of the skin and subcutaneous tissue: Secondary | ICD-10-CM | POA: Diagnosis not present

## 2022-01-17 DIAGNOSIS — L820 Inflamed seborrheic keratosis: Secondary | ICD-10-CM

## 2022-01-17 DIAGNOSIS — L578 Other skin changes due to chronic exposure to nonionizing radiation: Secondary | ICD-10-CM | POA: Diagnosis not present

## 2022-01-17 NOTE — Patient Instructions (Addendum)
Cryotherapy Aftercare  Wash gently with soap and water everyday.   Apply Vaseline and Band-Aid daily until healed.   Prior to procedure, discussed risks of blister formation, small wound, skin dyspigmentation, or rare scar following cryotherapy. Recommend Vaseline ointment to treated areas while healing.  If You Need Anything After Your Visit  If you have any questions or concerns for your doctor, please call our main line at [phone] and press option 4 to reach your doctor's medical assistant. If no one answers, please leave a voicemail as directed and we will return your call as soon as possible. Messages left after 4 pm will be answered the following business day.   You may also send [place] a message via [place]. We typically respond to MyChart messages within 1-2 business days.  For prescription refills, please ask your pharmacy to contact our office. Our fax number is [phone].  If you have an urgent issue when the clinic is closed that cannot wait until the next business day, you can page your doctor at the number below.    Please note that while we do our best to be available for urgent issues outside of office hours, we are not available 24/7.   If you have an urgent issue and are unable to reach [place], you may choose to seek medical care at your doctor's office, retail clinic, urgent care center, or emergency room.  If you have a medical emergency, please immediately call 911 or go to the emergency department.  Pager Numbers  - Dr. Nehemiah Massed: [phone]  - Dr. Laurence Ferrari: [phone]  - Dr. Nicole Kindred: [phone]  In the event of inclement weather, please call our main line at [phone] for an update on the status of any delays or closures.  Dermatology Medication Tips: Please keep the boxes that topical medications come in in order to help keep track of the instructions about where and how to use these. Pharmacies typically print the medication instructions only on the  boxes and not directly on the medication tubes.   If your medication is too expensive, please contact our office at [phone] option 4 or send [place] a message through [place].   We are unable to tell what your co-pay for medications will be in advance as this is different depending on your insurance coverage. However, we may be able to find a substitute medication at lower cost or fill out paperwork to get insurance to cover a needed medication.   If a prior authorization is required to get your medication covered by your insurance company, please allow [place] 1-2 business days to complete this process.  Drug prices often vary depending on where the prescription is filled and some pharmacies may offer cheaper prices.  The website www.goodrx.com contains coupons for medications through different pharmacies. The prices here do not account for what the cost may be with help from insurance (it may be cheaper with your insurance), but the website can give you the price if you did not use any insurance.  - You can print the associated coupon and take it with your prescription to the pharmacy.  - You may also stop by our office during regular business hours and pick up a GoodRx coupon card.  - If you need your prescription sent electronically to a different pharmacy, notify our office through [facility] or by phone at [phone] option 4.     Si Usted Necesita Algo Despus de Su Visita  Tambin puede enviarnos un mensaje a travs de  MyChart. Por lo general respondemos a los mensajes de MyChart en el transcurso de 1 a 2 das hbiles.  Para renovar recetas, por favor pida a su farmacia que se ponga en contacto con nuestra oficina. Harland Dingwall de fax es [place] [phone].  Si tiene un asunto urgente cuando la clnica est cerrada y que no puede esperar hasta el siguiente da hbil, puede llamar/localizar a su doctor(a) al nmero que aparece a continuacin.   Por favor, tenga en cuenta que  aunque hacemos todo lo posible para estar disponibles para asuntos urgentes fuera del horario de [place], no estamos disponibles las 24 horas del da, los 7 das de la [place].   Si tiene un problema urgente y no puede comunicarse con nosotros, puede optar por buscar atencin mdica  en el consultorio de su doctor(a), en una clnica privada, en un centro de atencin urgente o en una sala de emergencias.  Si tiene Engineering geologist, por favor llame inmediatamente al 911 o vaya a la sala de emergencias.  Nmeros de bper  - Dr. Nehemiah Massed: [phone]  - Dra. Moye: [phone]  - Dra. Nicole Kindred: [phone]  En caso de inclemencias del [address], por favor llame a Johnsie Kindred principal al [phone] para una actualizacin sobre el [place] de cualquier retraso o cierre.  Consejos para la medicacin en dermatologa: Por favor, guarde las cajas en las que vienen los medicamentos de uso tpico para ayudarle a seguir las instrucciones sobre dnde y cmo usarlos. Las farmacias generalmente imprimen las instrucciones del medicamento slo en las cajas y no directamente en los tubos del [place].   Si su medicamento es muy caro, por favor, pngase en contacto con Zigmund Daniel llamando al [phone] y presione la opcin 4 o envenos un mensaje a travs de Pharmacist, community.   No podemos decirle cul ser su copago por los medicamentos por adelantado ya que esto es diferente dependiendo de la cobertura de su seguro. Sin embargo, es posible que podamos encontrar un medicamento sustituto a Electrical engineer un formulario para que el seguro cubra el medicamento que se considera necesario.   Si se requiere una autorizacin previa para que su compaa de seguros [place] su medicamento, por favor permtanos de 1 a 2 das hbiles para completar este proceso.  Los precios de los medicamentos varan con frecuencia dependiendo del Environmental consultant de dnde se surte la receta y alguna farmacias pueden ofrecer precios ms  baratos.  El sitio web www.goodrx.com tiene cupones para medicamentos de Airline pilot. Los precios aqu no tienen en cuenta lo que podra costar con la ayuda del seguro (puede ser ms barato con su seguro), pero el sitio web puede darle el precio si no utiliz Research scientist (physical sciences).  - Puede imprimir el cupn correspondiente y llevarlo con su receta a la farmacia.  - Tambin puede pasar por nuestra oficina durante el horario de atencin regular y Charity fundraiser una tarjeta de cupones de GoodRx.  - Si necesita que su receta se enve electrnicamente a una farmacia diferente, informe a nuestra oficina a travs de MyChart de [place] o por telfono llamando al [phone] y presione la opcin 4.

## 2022-01-17 NOTE — Progress Notes (Signed)
° °  Follow-Up Visit   Subjective  Kristina Carlson is a 86 y.o. female who presents for the following: Follow-up (Patient here today for ISK and AK follow up, treated with LN2. Patient does have another spot at left lower leg that itches. ).  The following portions of the chart were reviewed this encounter and updated as appropriate:   Tobacco   Allergies   Meds   Problems   Med Hx   Surg Hx   Fam Hx       Review of Systems:  No other skin or systemic complaints except as noted in HPI or Assessment and Plan.  Objective  Well appearing patient in no apparent distress; mood and affect are within normal limits.  A focused examination was performed including legs, face. Relevant physical exam findings are noted in the Assessment and Plan.  left lateral pretibia x 1 Erythematous stuck-on, waxy papule or plaque    Assessment & Plan  Inflamed seborrheic keratosis left lateral pretibia x 1  Symptomatic  Prior to procedure, discussed risks of blister formation, small wound, skin dyspigmentation, or rare scar following cryotherapy. Recommend Vaseline ointment to treated areas while healing.   Destruction of lesion - left lateral pretibia x 1  Destruction method: cryotherapy   Informed consent: discussed and consent obtained   Lesion destroyed using liquid nitrogen: Yes   Cryotherapy cycles:  2 Outcome: patient tolerated procedure well with no complications   Post-procedure details: wound care instructions given    History of PreCancerous Actinic Keratosis  - site(s) of PreCancerous Actinic Keratosis clear today at Right Lower Medial Eyelid. - these may recur and new lesions may form requiring treatment to prevent transformation into skin cancer - observe for new or changing spots and contact [place] for appointment if occur - photoprotection with sun protective clothing; sunglasses and broad spectrum sunscreen with SPF of at least 30 + and frequent self skin exams  recommended - yearly exams by a dermatologist recommended for persons with history of PreCancerous Actinic Keratoses  Actinic Damage - chronic, secondary to cumulative UV radiation exposure/sun exposure over time - diffuse scaly erythematous macules with underlying dyspigmentation - Recommend daily broad spectrum sunscreen SPF 30+ to sun-exposed areas, reapply every 2 hours as needed.  - Recommend staying in the shade or wearing long sleeves, sun glasses (UVA+UVB protection) and wide brim hats (4-inch brim around the entire circumference of the hat). - Call for new or changing lesions.   Return in about 1 year (around 01/17/2023) for TBSE.  Graciella Belton, RMA, am acting as scribe for Forest Gleason, MD .  Documentation: I have reviewed the above documentation for accuracy and completeness, and I agree with the above.  Forest Gleason, MD

## 2022-01-19 ENCOUNTER — Encounter: Payer: Self-pay | Admitting: Dermatology

## 2022-02-07 ENCOUNTER — Other Ambulatory Visit: Payer: Self-pay

## 2022-02-07 ENCOUNTER — Ambulatory Visit (INDEPENDENT_AMBULATORY_CARE_PROVIDER_SITE_OTHER): Payer: Medicare Other | Admitting: Family Medicine

## 2022-02-07 VITALS — BP 138/72 | HR 94 | Temp 98.1°F | Ht 63.5 in | Wt 163.2 lb

## 2022-02-07 DIAGNOSIS — K5900 Constipation, unspecified: Secondary | ICD-10-CM

## 2022-02-07 DIAGNOSIS — G4762 Sleep related leg cramps: Secondary | ICD-10-CM | POA: Insufficient documentation

## 2022-02-07 NOTE — Assessment & Plan Note (Signed)
Patient concerned about rectal mass.  Reviewed visit in January with normal rectal exam but presence of pelvic organ prolapse.  Has urogyn appointment in 2 weeks discussed deferring exam to that appointment.  Encouraged taking stool softener twice daily and drinking more water.

## 2022-02-07 NOTE — Patient Instructions (Addendum)
Leg cramps - Drink more water -- try to drink 16 ounces of water  - active during the day - stretching - use a towel to pull your foot in the flexed position to stretch  Constipation - continue probiotic  - continue stool softener - consider increasing to twice daily - Doculax if no improvement with increasing stool softener

## 2022-02-07 NOTE — Progress Notes (Signed)
Subjective:     Kristina Carlson is a 86 y.o. female presenting for Leg Pain (Both legs and worse at night )     HPI  #Leg pain - started 12/2021 - saw provider on 01/04/2022 - worse at night - achy - weak - shin is the area that is the worst - sweet tea - pint for lunch and pint for supper   - Treatment: topical pain relief - menthol - w/o improvement before bed  Normal magnesium and electrolytes and negative DVT  No blood or dark/tarry stool Feeling a pressure  Review of Systems   Social History   Tobacco Use  Smoking Status Never  Smokeless Tobacco Never        Objective:    BP Readings from Last 3 Encounters:  02/07/22 138/72  01/04/22 (!) 166/72  07/26/21 126/70   Wt Readings from Last 3 Encounters:  02/07/22 163 lb 3 oz (74 kg)  01/04/22 165 lb (74.8 kg)  07/26/21 168 lb (76.2 kg)    BP 138/72    Pulse 94    Temp 98.1 F (36.7 C) (Oral)    Ht 5' 3.5" (1.613 m)    Wt 163 lb 3 oz (74 kg)    SpO2 96%    BMI 28.45 kg/m    Physical Exam Constitutional:      General: She is not in acute distress.    Appearance: She is well-developed. She is not diaphoretic.  HENT:     Right Ear: External ear normal.     Left Ear: External ear normal.     Nose: Nose normal.  Eyes:     Conjunctiva/sclera: Conjunctivae normal.  Cardiovascular:     Rate and Rhythm: Normal rate and regular rhythm.     Heart sounds: No murmur heard. Pulmonary:     Effort: Pulmonary effort is normal. No respiratory distress.     Breath sounds: Normal breath sounds. No wheezing.  Musculoskeletal:     Cervical back: Neck supple.     Comments: No swelling or erythema of legs. TTP along the anterior shins bilateral. No calf tendernes  Skin:    General: Skin is warm and dry.     Capillary Refill: Capillary refill takes less than 2 seconds.  Neurological:     Mental Status: She is alert. Mental status is at baseline.  Psychiatric:        Mood and Affect: Mood normal.        Behavior:  Behavior normal.          Assessment & Plan:   Problem List Items Addressed This Visit       Other   Constipation    Patient concerned about rectal mass.  Reviewed visit in January with normal rectal exam but presence of pelvic organ prolapse.  Has urogyn appointment in 2 weeks discussed deferring exam to that appointment.  Encouraged taking stool softener twice daily and drinking more water.      Nocturnal leg cramps - Primary    Reviewed normal electrolytes from previous visit.  Ultrasound ruled without DVT from last month.  Suspect this is leg cramps advised to increase hydration and stretches.  If no improvement consider ferritin.        Return if symptoms worsen or fail to improve.  Lesleigh Noe, MD  This visit occurred during the SARS-CoV-2 public health emergency.  Safety protocols were in place, including screening questions prior to the visit, additional usage of staff PPE,  and extensive cleaning of exam room while observing appropriate contact time as indicated for disinfecting solutions.

## 2022-02-07 NOTE — Assessment & Plan Note (Signed)
Reviewed normal electrolytes from previous visit.  Ultrasound ruled without DVT from last month.  Suspect this is leg cramps advised to increase hydration and stretches.  If no improvement consider ferritin.

## 2022-02-23 ENCOUNTER — Encounter: Payer: Self-pay | Admitting: Obstetrics and Gynecology

## 2022-02-23 ENCOUNTER — Ambulatory Visit: Payer: Medicare Other | Admitting: Obstetrics and Gynecology

## 2022-02-23 ENCOUNTER — Other Ambulatory Visit: Payer: Self-pay

## 2022-02-23 VITALS — BP 174/87 | HR 102 | Ht 62.0 in | Wt 160.0 lb

## 2022-02-23 DIAGNOSIS — N812 Incomplete uterovaginal prolapse: Secondary | ICD-10-CM

## 2022-02-23 DIAGNOSIS — N816 Rectocele: Secondary | ICD-10-CM | POA: Diagnosis not present

## 2022-02-23 DIAGNOSIS — N3281 Overactive bladder: Secondary | ICD-10-CM | POA: Diagnosis not present

## 2022-02-23 DIAGNOSIS — N8110 Cystocele, unspecified: Secondary | ICD-10-CM | POA: Diagnosis not present

## 2022-02-23 DIAGNOSIS — R350 Frequency of micturition: Secondary | ICD-10-CM

## 2022-02-23 LAB — POCT URINALYSIS DIPSTICK
Bilirubin, UA: NEGATIVE
Blood, UA: NEGATIVE
Glucose, UA: NEGATIVE
Ketones, UA: NEGATIVE
Leukocytes, UA: NEGATIVE
Nitrite, UA: NEGATIVE
Protein, UA: NEGATIVE
Spec Grav, UA: 1.015 (ref 1.010–1.025)
Urobilinogen, UA: 0.2 U/dL
pH, UA: 6.5 (ref 5.0–8.0)

## 2022-02-23 NOTE — Progress Notes (Signed)
Kristina Carlson ?New Patient Evaluation and Consultation ? ?Referring Provider: Eugenia Pancoast, FNP ?PCP: Lesleigh Noe, MD ?Date of Service: 02/23/2022 ? ?SUBJECTIVE ?Chief Complaint: New Patient (Initial Visit) Kristina Carlson is a 86 y.o. female here for an evaluation on prolapse.) ? ?History of Present Illness: Kristina Carlson is a 86 y.o. White or Caucasian female seen in consultation at the request of FNP Eugenia Pancoast for evaluation of prolapse.   ? ?Review of records from [place] significant for: ?She was straining a lot with bowel movement and noticed a small mass in the vagina which she pushed back inside.  ? ?Urinary Symptoms: ?Leaks urine with going from sitting to standing, with a full bladder, and with urgency ?Leaks 1 time(s) per day- only happens if she holds it too long.  ?Pad use:  liners/ mini-pads  ?She is not bothered by her UI symptoms. ? ?Day time voids 10+.  Nocturia: every 2 hours at night to void. Frequency is not bothersome to her.  ?Voiding dysfunction: she empties her bladder well.  ?does not use a catheter to empty bladder.  ?When urinating, she feels she has no difficulties ?Drinks: decaf tea and coffee, trying to drink more water.  ?Has tried several pills for her bladder and never saw an improvement.  ? ?UTIs:  0  UTI's in the last year.   ?Denies history of blood in urine and kidney or bladder stones ? ?Pelvic Organ Prolapse Symptoms:                  ?She Admits to a feeling of a bulge the vaginal area. It has been present for 3 months. Several years ago felt something but was told nothing was there.  ?She Admits to seeing a bulge.  ?This bulge is not bothersome. ? ?Bowel Symptom: ?Bowel movements: 2 time(s) per day ?Stool consistency: soft  ?Straining: yes.  ?Splinting: no.  ?Incomplete evacuation: yes.  ?She Denies accidental bowel leakage / fecal incontinence ?Bowel regimen: stool softener ? ? ?Sexual Function ?Sexually active: no.  ? ?Pelvic Pain ?Denies pelvic  pain ? ? ?Past Medical History:  ?Past Medical History:  ?Diagnosis Date  ? Hyperlipidemia   ? Hypertension   ? Hypothyroid   ? Insomnia   ? Macular degeneration   ? -- Dr. Laurey Arrow (optometrist).  Taking Ocuvite.  ? ? ? ?Past Surgical History:   ?Past Surgical History:  ?Procedure Laterality Date  ? LEFT HEART CATHETERIZATION WITH CORONARY ANGIOGRAM N/A 06/29/2014  ? Procedure: LEFT HEART CATHETERIZATION WITH CORONARY ANGIOGRAM;  Surgeon: Sinclair Grooms, MD;  Location: [facility] CATH LAB;  Service: Cardiovascular;  Laterality: N/A;  ? ? ? ?Past OB/GYN History: ?OB History  ?Gravida Para Term Preterm AB Living  ?7       6 1   ?SAB IAB Ectopic Multiple Live Births  ?6       1  ?  ?# Outcome Date GA Lbr Len/2nd Weight Sex Delivery Anes PTL Lv  ?7 Gravida           ?6 SAB           ?5 SAB           ?4 SAB           ?3 SAB           ?2 SAB           ?1 SAB           ? ? ?  Vaginal deliveries: 1 ?Menopausal: Yes, Denies vaginal bleeding since menopause ? ? ?Medications: She has a current medication list which includes the following prescription(s): aspirin, atorvastatin, calcium 600+d3, cyanocobalamin, famotidine, levothyroxine, losartan, ocuvite preservision, ondansetron, pantoprazole, and probiotic product.  ? ?Allergies: Patient is allergic to lisinopril.  ? ?Social History:  ?Social History  ? ?Tobacco Use  ? Smoking status: Never  ? Smokeless tobacco: Never  ?Vaping Use  ? Vaping Use: Never used  ?Substance Use Topics  ? Alcohol use: No  ? Drug use: No  ? ? ?Relationship status: widowed ?She lives with brother.   ?She is not employed. ?Regular exercise: No ?History of abuse: No ? ?Family History:   ?Family History  ?Problem Relation Age of Onset  ? Stroke Father   ? Diabetes Brother   ? Breast cancer Neg Hx   ? ? ? ?Review of Systems: Review of Systems  ?Constitutional:  Negative for fever, malaise/fatigue and weight loss.  ?Respiratory:  Negative for cough, shortness of breath and wheezing.   ?Cardiovascular:  Negative  for chest pain, palpitations and leg swelling.  ?Gastrointestinal:  Negative for abdominal pain and blood in stool.  ?Genitourinary:  Negative for dysuria.  ?Musculoskeletal:  Negative for myalgias.  ?Skin:  Negative for rash.  ?Neurological:  Negative for dizziness and headaches.  ?Endo/Heme/Allergies:  Does not bruise/bleed easily.  ?Psychiatric/Behavioral:  Negative for depression. The patient is not nervous/anxious.   ? ? ?OBJECTIVE ?Physical Exam: ?Vitals:  ? 02/23/22 0954  ?BP: (!) 174/87  ?Pulse: (!) 102  ?Weight: 160 lb (72.6 kg)  ?Height: 5\' 2"  (1.575 m)  ? ? ?Physical Exam ?Constitutional:   ?   General: She is not in acute distress. ?Pulmonary:  ?   Effort: Pulmonary effort is normal.  ?Abdominal:  ?   General: There is no distension.  ?   Palpations: Abdomen is soft.  ?   Tenderness: There is no abdominal tenderness. There is no rebound.  ?Musculoskeletal:     ?   General: No swelling. Normal range of motion.  ?Skin: ?   General: Skin is warm and dry.  ?   Findings: No rash.  ?Neurological:  ?   Mental Status: She is alert and oriented to person, place, and time.  ?Psychiatric:     ?   Mood and Affect: Mood normal.     ?   Behavior: Behavior normal.  ? ? ? ?GU / Detailed Urogynecologic Evaluation:  ?Pelvic Exam: Normal external female genitalia; Bartholin's and Skene's glands normal in appearance; urethral meatus normal in appearance, no urethral masses or discharge.  ? ?CST: negative ? ?Speculum exam reveals normal vaginal mucosa with atrophy. Cervix normal appearance. Uterus normal single, nontender. Adnexa no mass, fullness, tenderness.   ? ?Pelvic floor strength II/V ? ?Pelvic floor musculature: Right levator non-tender, Right obturator non-tender, Left levator non-tender, Left obturator non-tender ? ?POP-Q:  ? ?POP-Q ? ?1  ?                                          Aa   ?1 ?                                          Ba  ?1  ?  C  ? ?3  ?                                           Gh  ?4  ?                                          Pb  ?8  ?                                          tvl  ? ?-0.5  ?                                          Ap  ?-0.5  ?                                          Bp  ?-5  ?                                            D  ? ? ? ?Rectal Exam:  ?Normal external rectum ? ?Post-Void Residual (PVR) by Bladder Scan: ?In order to evaluate bladder emptying, we discussed obtaining a postvoid residual and she agreed to this procedure. ? ?Procedure: The ultrasound unit was placed on the patient's abdomen in the suprapubic region after the patient had voided. A PVR of 63 ml was obtained by bladder scan. ? ?Laboratory Results: ?POC urine: negative ? ? ?ASSESSMENT AND PLAN ?Ms. Uncapher is a 86 y.o. with:  ?1. Prolapse of anterior vaginal wall   ?2. Uterovaginal prolapse, incomplete   ?3. Prolapse of posterior vaginal wall   ?4. Overactive bladder   ?5. Urinary frequency   ? ? ?Stage II anterior, Stage II posterior, Stage II apical prolapse ?- For treatment of pelvic organ prolapse, we discussed options for management including expectant management, conservative management, and surgical management, such as Kegels, a pessary, pelvic floor physical therapy, and specific surgical procedures. ?-She is interested in a pessary, will return for a fitting ? ?2. OAB ?- not interested in treatment at this time  ? ?Return for pessary fitting ? ?Kristina Folds, MD ? ? ? ? ?

## 2022-02-27 DIAGNOSIS — H0288B Meibomian gland dysfunction left eye, upper and lower eyelids: Secondary | ICD-10-CM | POA: Diagnosis not present

## 2022-02-27 DIAGNOSIS — Z9842 Cataract extraction status, left eye: Secondary | ICD-10-CM | POA: Diagnosis not present

## 2022-02-27 DIAGNOSIS — H353133 Nonexudative age-related macular degeneration, bilateral, advanced atrophic without subfoveal involvement: Secondary | ICD-10-CM | POA: Diagnosis not present

## 2022-02-27 DIAGNOSIS — Z9841 Cataract extraction status, right eye: Secondary | ICD-10-CM | POA: Diagnosis not present

## 2022-02-27 DIAGNOSIS — H52223 Regular astigmatism, bilateral: Secondary | ICD-10-CM | POA: Diagnosis not present

## 2022-02-27 DIAGNOSIS — H0288A Meibomian gland dysfunction right eye, upper and lower eyelids: Secondary | ICD-10-CM | POA: Diagnosis not present

## 2022-03-02 ENCOUNTER — Encounter: Payer: Self-pay | Admitting: Family Medicine

## 2022-03-21 ENCOUNTER — Ambulatory Visit (INDEPENDENT_AMBULATORY_CARE_PROVIDER_SITE_OTHER): Payer: Medicare Other | Admitting: Family Medicine

## 2022-03-21 ENCOUNTER — Other Ambulatory Visit: Payer: Self-pay

## 2022-03-21 VITALS — BP 140/80 | HR 73 | Temp 97.5°F | Ht 62.0 in | Wt 161.5 lb

## 2022-03-21 DIAGNOSIS — K219 Gastro-esophageal reflux disease without esophagitis: Secondary | ICD-10-CM | POA: Diagnosis not present

## 2022-03-21 NOTE — Progress Notes (Signed)
? ?Subjective:  ? ?  ?Kristina Carlson is a 86 y.o. female presenting for Chest Pain and Heartburn (Worse at night. Keeping pt awake ) ?  ? ? ?Chest Pain  ?This is a recurrent problem. The current episode started in the past 7 days. The problem occurs constantly. The problem has been unchanged. The pain is present in the substernal region. The pain is at a severity of 8/10 (worse at night). The quality of the pain is described as burning. The pain does not radiate. Associated symptoms include a cough. Pertinent negatives include no abdominal pain, back pain, diaphoresis, dizziness, exertional chest pressure, fever, irregular heartbeat, leg pain, malaise/fatigue, nausea, near-syncope, numbness, palpitations, PND, shortness of breath, vomiting or weakness. The cough is Productive. Associated with: laying down. She has tried antacids (sitting up) for the symptoms. The treatment provided no relief. Risk factors include being elderly.  ?Heartburn ?She complains of chest pain, coughing and heartburn. She reports no abdominal pain or no nausea.  ? ?Already taking protonix and pepcid ?Picked up nexium recently w/o improvement ? ?Review of Systems  ?Constitutional:  Negative for diaphoresis, fever and malaise/fatigue.  ?Respiratory:  Positive for cough. Negative for shortness of breath.   ?Cardiovascular:  Positive for chest pain. Negative for palpitations, PND and near-syncope.  ?Gastrointestinal:  Positive for heartburn. Negative for abdominal pain, nausea and vomiting.  ?Musculoskeletal:  Negative for back pain.  ?Neurological:  Negative for dizziness, weakness and numbness.  ? ? ?Social History  ? ?Tobacco Use  ?Smoking Status Never  ?Smokeless Tobacco Never  ? ? ? ?   ?Objective:  ?  ?BP Readings from Last 3 Encounters:  ?03/21/22 140/80  ?02/23/22 (!) 174/87  ?02/07/22 138/72  ? ?Wt Readings from Last 3 Encounters:  ?03/21/22 161 lb 8 oz (73.3 kg)  ?02/23/22 160 lb (72.6 kg)  ?02/07/22 163 lb 3 oz (74 kg)  ? ? ?BP 140/80    Pulse 73   Temp (!) 97.5 ?F (36.4 ?C) (Oral)   Ht '5\' 2"'$  (1.575 m)   Wt 161 lb 8 oz (73.3 kg)   SpO2 98%   BMI 29.54 kg/m?  ? ? ?Physical Exam ?Constitutional:   ?   General: She is not in acute distress. ?   Appearance: She is well-developed. She is not diaphoretic.  ?HENT:  ?   Right Ear: External ear normal.  ?   Left Ear: External ear normal.  ?   Nose: Nose normal.  ?Eyes:  ?   Conjunctiva/sclera: Conjunctivae normal.  ?Cardiovascular:  ?   Rate and Rhythm: Normal rate and regular rhythm.  ?   Heart sounds: Murmur heard.  ?Pulmonary:  ?   Effort: Pulmonary effort is normal. No respiratory distress.  ?   Breath sounds: Normal breath sounds. No wheezing.  ?Abdominal:  ?   General: Abdomen is flat. Bowel sounds are normal.  ?   Palpations: Abdomen is soft.  ?   Tenderness: There is no abdominal tenderness. There is no guarding or rebound.  ?Musculoskeletal:  ?   Cervical back: Neck supple.  ?Skin: ?   General: Skin is warm and dry.  ?   Capillary Refill: Capillary refill takes less than 2 seconds.  ?Neurological:  ?   Mental Status: She is alert. Mental status is at baseline.  ?Psychiatric:     ?   Mood and Affect: Mood normal.     ?   Behavior: Behavior normal.  ? ? ?ER 04/15/2019 - CP r/o  at that time. Nighttime symptoms restarted pantoprazole ? ? ?   ?Assessment & Plan:  ? ?Problem List Items Addressed This Visit   ? ?  ? Digestive  ? GERD (gastroesophageal reflux disease) - Primary (Chronic)  ?  Pt notes in the past when she had these symptoms her cardiac work-up was normal symptoms improved on PPI (reviewed ER visit from 2020). She has been taking pantoprazole 40 mg and pepcid 20 mg daily w/o improvement. Advised increasing pantoprazole to 40 mg BID. She will update next week. Strict ER precautions provided due to CAD however, BP good today and no exertional symptoms. Anticipate if no improvement will plan GI referral and cardiology support as well.  ?  ?  ? Relevant Medications  ? Esomeprazole  Magnesium (NEXIUM 24HR PO)  ? ? ? ?Return in about 1 week (around 03/28/2022), or if symptoms worsen or fail to improve. ? ?Lesleigh Noe, MD ? ?This visit occurred during the SARS-CoV-2 public health emergency.  Safety protocols were in place, including screening questions prior to the visit, additional usage of staff PPE, and extensive cleaning of exam room while observing appropriate contact time as indicated for disinfecting solutions.  ? ?

## 2022-03-21 NOTE — Assessment & Plan Note (Addendum)
Pt notes in the past when she had these symptoms her cardiac work-up was normal symptoms improved on PPI (reviewed ER visit from 2020). She has been taking pantoprazole 40 mg and pepcid 20 mg daily w/o improvement. Advised increasing pantoprazole to 40 mg BID. She will update next week. Strict ER precautions provided due to CAD however, BP good today and no exertional symptoms. Anticipate if no improvement will plan GI referral and cardiology support as well.  ?

## 2022-03-21 NOTE — Patient Instructions (Signed)
Take protonix 40 mg twice daily for 1 week. Update with how your symptoms are doing. We may need to have you see GI ? ?If worsening chest pain or shortness of breath or palpations - return or go to the ER ?

## 2022-04-02 ENCOUNTER — Encounter: Payer: Self-pay | Admitting: Obstetrics and Gynecology

## 2022-04-02 ENCOUNTER — Ambulatory Visit: Payer: Medicare Other | Admitting: Obstetrics and Gynecology

## 2022-04-02 VITALS — BP 134/74 | HR 78

## 2022-04-02 DIAGNOSIS — N8110 Cystocele, unspecified: Secondary | ICD-10-CM

## 2022-04-02 DIAGNOSIS — N812 Incomplete uterovaginal prolapse: Secondary | ICD-10-CM | POA: Diagnosis not present

## 2022-04-02 NOTE — Progress Notes (Signed)
[facility] ? ? ?Subjective:  ?  ? ?Chief Complaint: Pessary Fitting Kristina Carlson is a 86 y.o. female here for a pessary fitting) ? ?History of Present Illness: ?Kristina Carlson is a 86 y.o. female with stage II pelvic organ prolapse who presents today for a pessary fitting.  ? ? ?Past Medical History: ?Patient  has a past medical history of Hyperlipidemia, Hypertension, Hypothyroid, Insomnia, and Macular degeneration.  ? ?Past Surgical History: ?She  has a past surgical history that includes left heart catheterization with coronary angiogram (N/A, 06/29/2014).  ? ?Medications: ?She has a current medication list which includes the following prescription(s): aspirin, atorvastatin, calcium 600+d3, cyanocobalamin, esomeprazole magnesium, famotidine, levothyroxine, losartan, ocuvite preservision, ondansetron, and pantoprazole.  ? ?Allergies: ?Patient is allergic to lisinopril.  ? ?Social History: ?Patient  reports that she has never smoked. She has never used smokeless tobacco. She reports that she does not drink alcohol and does not use drugs.  ? ?  ? ?Objective:  ?  ?BP 134/74   Pulse 78  ?Gen: No apparent distress, A&O x 3. ?Pelvic Exam: Normal external female genitalia; Bartholin's and Skene's glands normal in appearance; urethral meatus normal in appearance, no urethral masses or discharge.  ? ?A size #4 ring with support pessary was fitted. It was comfortable, stayed in place with valsalva, standing and bending and was an appropriate size on examination, with one finger fitting between the pessary and the vaginal walls. Lot #[id number], Exp 04/26/25 ? ?POP-Q:  ?  ?POP-Q ?  ?1  ?                                          Aa   ?1 ?                                          Ba   ?1  ?                                            C  ?  ?3  ?                                          Gh   ?4  ?                                          Pb   ?8  ?                                          tvl  ?  ?-0.5  ?                                           Ap   ?-0.5  ?  Bp   ?-5  ?                                            D  ?  ? ? ?Assessment/Plan:  ?  ?Assessment: ?Ms. Reder is a 86 y.o. with stage II pelvic organ prolapse who presents for a pessary fitting. ? ?Plan: ?She was fitted with a #4 ring with support pessary. She will keep the pessary in place until next visit.  ? ?Follow-up in 3 weeks for a pessary check or sooner as needed.  ?All questions were answered. ?  ? ?Jaquita Folds, MD ? ?

## 2022-04-25 ENCOUNTER — Ambulatory Visit: Payer: Medicare Other | Admitting: Obstetrics and Gynecology

## 2022-04-25 ENCOUNTER — Encounter: Payer: Self-pay | Admitting: Obstetrics and Gynecology

## 2022-04-25 VITALS — BP 134/74 | HR 80

## 2022-04-25 DIAGNOSIS — N8110 Cystocele, unspecified: Secondary | ICD-10-CM | POA: Diagnosis not present

## 2022-04-25 DIAGNOSIS — N812 Incomplete uterovaginal prolapse: Secondary | ICD-10-CM | POA: Diagnosis not present

## 2022-04-25 NOTE — Progress Notes (Signed)
[facility] ? ? ?Subjective:  ?  ? ?Chief Complaint:  ?Chief Complaint  ?Patient presents with  ? Pessary Check  ? ?History of Present Illness: ?Kristina Carlson is a 85 y.o. female with stage II pelvic organ prolapse who presents for a pessary check. She is using a size #4 ring with support pessary. The pessary has been working well and she has no complaints. She denies vaginal bleeding. ? ?Past Medical History: ?Patient  has a past medical history of Hyperlipidemia, Hypertension, Hypothyroid, Insomnia, and Macular degeneration.  ? ?Past Surgical History: ?She  has a past surgical history that includes left heart catheterization with coronary angiogram (N/A, 06/29/2014).  ? ?Medications: ?She has a current medication list which includes the following prescription(s): aspirin, atorvastatin, calcium 600+d3, cyanocobalamin, esomeprazole magnesium, famotidine, levothyroxine, losartan, ocuvite preservision, ondansetron, and pantoprazole.  ? ?Allergies: ?Patient is allergic to lisinopril.  ? ?Social History: ?Patient  reports that she has never smoked. She has never used smokeless tobacco. She reports that she does not drink alcohol and does not use drugs.  ? ?  ? ?Objective:  ?  ?Physical Exam: ?BP 134/74   Pulse 80  ?Gen: No apparent distress, A&O x 3. ?Detailed Urogynecologic Evaluation:  ?Pelvic Exam: Normal external female genitalia; Bartholin's and Skene's glands normal in appearance; urethral meatus normal in appearance, no urethral masses or discharge. The pessary was noted to be in place. It was removed and cleaned. Speculum exam revealed no lesions in the vagina. The pessary was replaced. It was comfortable to the patient and fit well.  ? ?POP-Q:  ?  ?POP-Q ?  ?1  ?                                          Aa   ?1 ?                                          Ba   ?1  ?                                            C  ?  ?3  ?                                          Gh   ?4  ?                                           Pb   ?8  ?                                          tvl  ?  ?-0.5  ?  Ap   ?-0.5  ?                                          Bp   ?-5  ?                                            D  ?  ? ? ?  ? ?Assessment/Plan:  ?  ?Assessment: ?Kristina Carlson is a 86 y.o. with stage II pelvic organ prolapse here for a pessary check. She is doing well. ? ?Plan: ?- Continue size #4 ring with support pessary ?- Follow up 3 months for pessary check ? ?Jaquita Folds, MD ? ?Time spent: I spent 15 minutes dedicated to the care of this patient on the date of this encounter to include pre-visit review of records, face-to-face time with the patient and post visit documentation. ? ?  ? ?

## 2022-06-07 DIAGNOSIS — H02054 Trichiasis without entropian left upper eyelid: Secondary | ICD-10-CM | POA: Diagnosis not present

## 2022-06-11 ENCOUNTER — Other Ambulatory Visit: Payer: Self-pay | Admitting: Family Medicine

## 2022-06-11 ENCOUNTER — Ambulatory Visit (INDEPENDENT_AMBULATORY_CARE_PROVIDER_SITE_OTHER): Payer: Medicare Other

## 2022-06-11 VITALS — Ht 62.0 in | Wt 161.0 lb

## 2022-06-11 DIAGNOSIS — Z0000 Encounter for general adult medical examination without abnormal findings: Secondary | ICD-10-CM | POA: Diagnosis not present

## 2022-06-11 DIAGNOSIS — Z1231 Encounter for screening mammogram for malignant neoplasm of breast: Secondary | ICD-10-CM

## 2022-06-11 NOTE — Progress Notes (Signed)
Subjective:   Kristina Carlson is a 86 y.o. female who presents for Medicare Annual (Subsequent) preventive examination. Virtual Visit via Telephone Note  I connected with  Kristina Carlson on 06/11/22 at  1:15 PM EDT by telephone and verified that I am speaking with the correct person using two identifiers.  Location: Patient: HOME Provider: Eustace Pen Persons participating in the virtual visit: patient/Nurse Health Advisor   I discussed the limitations, risks, security and privacy concerns of performing an evaluation and management service by telephone and the availability of in person appointments. The patient expressed understanding and agreed to proceed.  Interactive audio and video telecommunications were attempted between this nurse and patient, however failed, due to patient having technical difficulties OR patient did not have access to video capability.  We continued and completed visit with audio only.  Some vital signs may be absent or patient reported.   Chriss Driver, LPN  Review of Systems     Cardiac Risk Factors include: advanced age (>15mn, >>40women);hypertension;dyslipidemia;sedentary lifestyle     Objective:    Today's Vitals   06/11/22 1319  Weight: 161 lb (73 kg)  Height: '5\' 2"'$  (1.575 m)   Body mass index is 29.45 kg/m.     06/11/2022    1:26 PM 06/08/2021    1:17 PM 03/30/2020   11:15 AM 10/15/2019    1:44 PM 06/08/2019    3:49 PM 04/15/2019   11:52 AM 06/19/2018    9:45 AM  Advanced Directives  Does Patient Have a Medical Advance Directive? Yes Yes Yes No No Yes No  Type of AParamed[place]of [place] will [place] will Living will;Healthcare Power of [place] will   Does patient want to make changes to medical advance directive?      No - Patient declined   Copy of [place] Chart? No - copy requested No - copy requested No -  copy requested      Would patient like information on creating a medical advance directive?    No - Patient declined No - Patient declined  No - Patient declined    Current Medications (verified) Outpatient Encounter Medications as of 06/11/2022  Medication Sig   aspirin 81 MG EC tablet TAKE 1 TABLET (81 MG TOTAL) BY MOUTH DAILY. SWALLOW WHOLE.   atorvastatin (LIPITOR) 40 MG tablet TAKE 1 TABLET BY MOUTH EVERY DAY   Calcium Carb-Cholecalciferol (CALCIUM 600+D3) 600-200 MG-UNIT TABS Take 1 tablet by mouth daily.    cyanocobalamin ([employer] VITAMIN B12) 1000 MCG tablet Take 1 tablet (1,000 mcg total) by mouth daily.   Esomeprazole Magnesium (NEXIUM 24HR PO) Take 20 mg by mouth daily.   famotidine (PEPCID) 20 MG tablet TAKE 1 TABLET BY MOUTH TWICE A DAY   levothyroxine (SYNTHROID) 75 MCG tablet TAKE 1 TABLET BY MOUTH DAILY BEFORE BREAKFAST.   losartan (COZAAR) 50 MG tablet Take 1 tablet (50 mg total) by mouth daily.   Multiple Vitamins-Minerals (OCUVITE PRESERVISION) TABS Take 1 tablet by mouth 2 (two) times a day.   ondansetron (ZOFRAN ODT) 4 MG disintegrating tablet Take 1 tablet (4 mg total) by mouth every 8 (eight) hours as needed for nausea or vomiting.   ondansetron (ZOFRAN) 4 MG tablet Take 4 mg by mouth daily.   pantoprazole (PROTONIX) 40 MG tablet Take 1 tablet (40 mg total) by mouth daily.   No facility-administered encounter medications on file as  of 06/11/2022.    Allergies (verified) Lisinopril   History: Past Medical History:  Diagnosis Date   Hyperlipidemia    Hypertension    Hypothyroid    Insomnia    Macular degeneration    -- Dr. Laurey Arrow (optometrist).  Taking Ocuvite.   Past Surgical History:  Procedure Laterality Date   LEFT HEART CATHETERIZATION WITH CORONARY ANGIOGRAM N/A 06/29/2014   Procedure: LEFT HEART CATHETERIZATION WITH CORONARY ANGIOGRAM;  Surgeon: Sinclair Grooms, MD;  Location[facility] CATH LAB;  Service: Cardiovascular;  Laterality: N/A;   Family  History  Problem Relation Age of Onset   Stroke Father    Diabetes Brother    Breast cancer Neg Hx    Social History   Socioeconomic History   Marital status: Widowed    Spouse name: Not on file   Number of children: 1   Years of education: 12   Highest education level: High school graduate  Occupational History   Occupation: Retired   Tobacco Use   Smoking status: Never   Smokeless tobacco: Never  Vaping Use   Vaping Use: Never used  Substance and Sexual Activity   Alcohol use: No   Drug use: No   Sexual activity: Not Currently  Other Topics Concern   Not on file  Social History Narrative   01/25/21   From: the area   Living: alone, has family nearby   Work: retired - Special educational needs teacher       Family: 1 child - Marya Amsler - 2 grandchildren and 2 great grandchildren      Enjoys: spend time with friends, reading      Exercise: not currently   Diet: pretty good, healthy foods, 3 meals a day      Safety   Seat belts: Yes    Guns: No   Safe in relationships: Yes    Social Determinants of Health   Financial Resource Strain: Low Risk  (06/11/2022)   Overall Financial Resource Strain (CARDIA)    Difficulty of Paying Living Expenses: Not hard at all  Food Insecurity: No Food Insecurity (06/11/2022)   Hunger Vital Sign    Worried About Running Out of Food in the Last Year: Never true    [place] in the Last Year: Never true  Transportation Needs: No Transportation Needs (06/11/2022)   PRAPARE - Hydrologist (Medical): No    Lack of Transportation (Non-Medical): No  Physical Activity: Insufficiently Active (06/11/2022)   Exercise Vital Sign    Days of Exercise per Week: 3 days    Minutes of Exercise per Session: 30 min  Stress: No Stress Concern Present (06/11/2022)   [place]    Feeling of Stress : Not at all  Social Connections: Moderately Integrated (06/11/2022)   Social  Connection and Isolation Panel [NHANES]    Frequency of Communication with Friends and Family: More than three times a week    Frequency of Social Gatherings with Friends and Family: More than three times a week    Attends Religious Services: More than 4 times per year    Active Member of [employer] or Organizations: Yes    Attends Archivist Meetings: More than 4 times per year    Marital Status: Widowed    Tobacco Counseling Counseling given: Not Answered   Clinical Intake:  Pre-visit preparation completed: Yes  Pain : No/denies pain     BMI -  recorded: 29.45 Nutritional Status: BMI 25 -29 Overweight Nutritional Risks: None Diabetes: No  How often do you need to have someone help you when you read instructions, pamphlets, or other written materials from your doctor or pharmacy?: 1 - Never  Diabetic?NO  Interpreter Needed?: No  Information entered by :: mj Javanni Maring, lpn   Activities of Daily Living    06/11/2022    1:28 PM  In your present state of health, do you have any difficulty performing the following activities:  Hearing? 1  Vision? 0  Difficulty concentrating or making decisions? 0  Walking or climbing stairs? 0  Dressing or bathing? 0  Doing errands, shopping? 0  Preparing Food and eating ? N  Using the Toilet? N  In the past six months, have you accidently leaked urine? Y  Comment at times.  Do you have problems with loss of bowel control? N  Managing your Medications? N  Managing your Finances? N  Housekeeping or managing your Housekeeping? N    Patient Care Team: Lesleigh Noe, MD as PCP - General (Family Medicine)  Indicate any recent Medical Services you may have received from other than [facility] providers in the past year (date may be approximate).     Assessment:   This is a routine wellness examination for Kristina Carlson.  Hearing/Vision screen Hearing Screening - Comments:: No hearing issues.  Vision Screening - Comments:: Glasses.  [facility]. 07/2021.  Dietary issues and exercise activities discussed: Current Exercise Habits: Home exercise routine, Type of exercise: walking, Time (Minutes): 30, Frequency (Times/Week): 3, Weekly Exercise (Minutes/Week): 90, Intensity: Mild, Exercise limited by: cardiac condition(s);orthopedic condition(s)   Goals Addressed             This Visit's Progress    Exercise 3x per week (30 min per time)   On track    Walk around the perimeter of your home or outside with the nicer weather with a friend or loved one.      Patient Stated   On track    06/08/2021, I will maintain and continue medications as prescribed.        Depression Screen    06/11/2022    1:23 PM 06/08/2021    1:18 PM 01/25/2021   10:10 AM 05/09/2020    9:17 AM 03/30/2020   11:16 AM 10/15/2019    1:44 PM 06/08/2019    3:49 PM  PHQ 2/9 Scores  PHQ - 2 Score 0 0 0 0 0 0 0  PHQ- 9 Score  0         Fall Risk    06/11/2022    1:26 PM 06/08/2021    1:18 PM 06/06/2021   11:29 AM 03/30/2020   11:16 AM 10/15/2019    1:44 PM  [place] in the past year? 0 0 0 0 0  Number falls in past yr: 0 0 0  0  Injury with Fall? 0 0     Risk for fall due to : No Fall Risks Medication side effect     Follow up Falls prevention discussed Falls evaluation completed;Falls prevention discussed       FALL RISK PREVENTION PERTAINING TO THE HOME:  Any stairs in or around the home? Yes  If so, are there any without handrails? No  Home free of loose throw rugs in walkways, pet beds, electrical cords, etc? Yes  Adequate lighting in your home to reduce risk of falls? Yes   ASSISTIVE DEVICES UTILIZED  TO PREVENT FALLS:  Life alert? No  Use of a cane, walker or w/c? No  Grab bars in the bathroom? Yes  Shower chair or bench in shower? Yes  Elevated toilet seat or a handicapped toilet? Yes   TIMED UP AND GO:  Was the test performed? No .  Phone visit.   Cognitive Function:    06/08/2021    1:22 PM  MMSE - Mini  Mental State Exam  Not completed: Refused        06/11/2022    1:29 PM 03/30/2020   11:18 AM  6CIT Screen  What Year? 0 points 0 points  What month? 0 points 0 points  What time? 0 points 0 points  Count back from 20 0 points 0 points  Months in reverse 0 points 0 points  Repeat phrase 0 points 0 points  Total Score 0 points 0 points    Immunizations Immunization History  Administered Date(s) Administered   Fluad Quad(high Dose 65+) 08/29/2019, 09/22/2020, 09/19/2021   Influenza Whole 11/12/2007, 09/17/2008, 10/14/2009, 10/25/2010, 09/05/2011   Influenza, High Dose Seasonal PF 08/20/2016, 08/30/2017, 08/30/2017, 09/17/2018   Influenza-Unspecified 08/24/2013, 09/22/2014, 09/17/2018   PFIZER(Purple Top)SARS-COV-2 Vaccination 02/29/2020, 03/22/2020, 10/13/2020   Pfizer Covid-19 Vaccine Bivalent Booster 39yr & up 12/26/2021   Pneumococcal Conjugate-13 06/22/2015, 02/17/2018, 02/17/2018   Pneumococcal Polysaccharide-23 11/23/2004   Tdap 09/05/2011, 02/28/2018, 03/06/2018   Zoster Recombinat (Shingrix) 05/26/2018, 07/26/2018   Zoster, Live 01/09/2018    TDAP status: Up to date  Flu Vaccine status: Up to date  Pneumococcal vaccine status: Up to date  Covid-19 vaccine status: Completed vaccines  Qualifies for Shingles Vaccine? Yes   Zostavax completed Yes   Shingrix Completed?: Yes  Screening Tests Health Maintenance  Topic Date Due   INFLUENZA VACCINE  07/24/2022   TETANUS/TDAP  03/06/2028   Pneumonia Vaccine 86 Years old  Completed   DEXA SCAN  Completed   COVID-19 Vaccine  Completed   Zoster Vaccines- Shingrix  Completed   HPV VACCINES  Aged Out    Health Maintenance  There are no preventive care reminders to display for this patient.  Colorectal cancer screening: No longer required.   Mammogram status: Completed 06/16/2021. Repeat every year  Bone Density status: Completed 07/04/2015. Results reflect: Bone density results: OSTEOPENIA. Repeat every 2  years.  Lung Cancer Screening: (Low Dose CT Chest recommended if Age 84-80years, 30 pack-year currently smoking OR have quit w/in 15years.) does not qualify.   Additional Screening:  Hepatitis C Screening: does not qualify.  Vision Screening: Recommended annual ophthalmology exams for early detection of glaucoma and other disorders of the eye. Is the patient up to date with their annual eye exam?  Yes  Who is the provider or what is the name of the office in which the patient attends annual eye exams[facility] is not [facility] with a provider, would they like to be referred to a provider to establish care? No .   Dental Screening: Recommended annual dental exams for proper oral hygiene  Community Resource Referral / Chronic Care Management: CRR required this visit?  No   CCM required this visit?  No      Plan:     I have personally reviewed and noted the following in the patient's chart:   Medical and social history Use of alcohol, tobacco or illicit drugs  Current medications and supplements including opioid prescriptions.  Functional ability and status Nutritional status Physical activity Advanced directives List of  other physicians Hospitalizations, surgeries, and ER visits in previous 12 months Vitals Screenings to include cognitive, depression, and falls Referrals and appointments  In addition, I have reviewed and discussed with patient certain preventive protocols, quality metrics, and best practice recommendations. A written personalized care plan for preventive services as well as general preventive health recommendations were provided to patient.     Chriss Driver, LPN   [id number]   Nurse Note: Pt is up to date on age appropriate vaccines and health maintenance.

## 2022-06-11 NOTE — Patient Instructions (Signed)
[name] , Thank you for taking time to come for your Medicare Wellness Visit. I appreciate your ongoing commitment to your health goals. Please review the following plan we discussed and let me know if I can assist you in the future.   Screening recommendations/referrals: Colonoscopy: No longer required due to age. Mammogram: Done 06/16/2021 Repeat annually  Bone Density: Done 07/04/2015 Repeat every 2 years  Recommended yearly ophthalmology/optometry visit for glaucoma screening and checkup Recommended yearly dental visit for hygiene and checkup  Vaccinations: Influenza vaccine: Done 09/19/2021 Repeat annually  Pneumococcal vaccine: Done 02/17/2018 and 11/23/2004. Tdap vaccine: Done 03/06/2018 Repeat in 10 years  Shingles vaccine: Done 07/26/2018, 02/26/2018 and 01/09/2018. Covid-19:Done 10/13/2020, 03/22/2020, 02/29/2020 and 12/26/2021.  Advanced directives: Please bring a copy of your health care power of attorney and living will to the office to be added to your chart at your convenience.   Conditions/risks identified: Aim for 30 minutes of exercise or brisk walking, 6-8 glasses of water, and 5 servings of fruits and vegetables each day. KEEP UP THE GOOD WORK!!  Next appointment: Follow up in one year for your annual wellness visit 2024.   Preventive Care 84 Years and Older, Female Preventive care refers to lifestyle choices and visits with your health care provider that can promote health and wellness. What does preventive care include? A yearly physical exam. This is also called an annual well check. Dental exams once or twice a year. Routine eye exams. Ask your health care provider how often you should have your eyes checked. Personal lifestyle choices, including: Daily care of your teeth and gums. Regular physical activity. Eating a healthy diet. Avoiding tobacco and drug use. Limiting alcohol use. Practicing safe sex. Taking low-dose aspirin every day. Taking vitamin and  mineral supplements as recommended by your health care provider. What happens during an annual well check? The services and screenings done by your health care provider during your annual well check will depend on your age, overall health, lifestyle risk factors, and family history of disease. Counseling  Your health care provider may ask you questions about your: Alcohol use. Tobacco use. Drug use. Emotional well-being. Home and relationship well-being. Sexual activity. Eating habits. History of falls. Memory and ability to understand (cognition). Work and work Statistician. Reproductive health. Screening  You may have the following tests or measurements: Height, weight, and BMI. Blood pressure. Lipid and cholesterol levels. These may be checked every 5 years, or more frequently if you are over 84 years old. Skin check. Lung cancer screening. You may have this screening every year starting at age 84 if you have a 30-pack-year history of smoking and currently smoke or have quit within the past 15 years. Fecal occult blood test (FOBT) of the stool. You may have this test every year starting at age 84. Flexible sigmoidoscopy or colonoscopy. You may have a sigmoidoscopy every 5 years or a colonoscopy every 10 years starting at age 84. Hepatitis C blood test. Hepatitis B blood test. Sexually transmitted disease (STD) testing. Diabetes screening. This is done by checking your blood sugar (glucose) after you have not eaten for a while (fasting). You may have this done every 1-3 years. Bone density scan. This is done to screen for osteoporosis. You may have this done starting at age 84. Mammogram. This may be done every 1-2 years. Talk to your health care provider about how often you should have regular mammograms. Talk with your health care provider about your test results, treatment options, and  if necessary, the need for more tests. Vaccines  Your health care provider may recommend  certain vaccines, such as: Influenza vaccine. This is recommended every year. Tetanus, diphtheria, and acellular pertussis (Tdap, Td) vaccine. You may need a Td booster every 10 years. Zoster vaccine. You may need this after age 84. Pneumococcal 13-valent conjugate (PCV13) vaccine. One dose is recommended after age 84. Pneumococcal polysaccharide (PPSV23) vaccine. One dose is recommended after age 84. Talk to your health care provider about which screenings and vaccines you need and how often you need them. This information is not intended to replace advice given to you by your health care provider. Make sure you discuss any questions you have with your health care provider. Document Released: 01/06/2016 Document Revised: 08/29/2016 Document Reviewed: 10/11/2015 Elsevier Interactive Patient Education  2017 [place] Prevention in the Home Falls can cause injuries. They can happen to people of all ages. There are many things you can do to make your home safe and to help prevent falls. What can I do on the outside of my home? Regularly fix the edges of walkways and driveways and fix any cracks. Remove anything that might make you trip as you walk through a door, such as a raised step or threshold. Trim any bushes or trees on the path to your home. Use bright outdoor lighting. Clear any walking paths of anything that might make someone trip, such as rocks or tools. Regularly check to see if handrails are loose or broken. Make sure that both sides of any steps have handrails. Any raised decks and porches should have guardrails on the edges. Have any leaves, snow, or ice cleared regularly. Use sand or salt on walking paths during winter. Clean up any spills in your garage right away. This includes oil or grease spills. What can I do in the bathroom? Use night lights. Install grab bars by the toilet and in the tub and shower. Do not use towel bars as grab bars. Use non-skid mats or  decals in the tub or shower. If you need to sit down in the shower, use a plastic, non-slip stool. Keep the floor dry. Clean up any water that spills on the floor as soon as it happens. Remove soap buildup in the tub or shower regularly. Attach bath mats securely with double-sided non-slip rug tape. Do not have throw rugs and other things on the floor that can make you trip. What can I do in the bedroom? Use night lights. Make sure that you have a light by your bed that is easy to reach. Do not use any sheets or blankets that are too big for your bed. They should not hang down onto the floor. Have a firm chair that has side arms. You can use this for support while you get dressed. Do not have throw rugs and other things on the floor that can make you trip. What can I do in the kitchen? Clean up any spills right away. Avoid walking on wet floors. Keep items that you use a lot in easy-to-reach places. If you need to reach something above you, use a strong step stool that has a grab bar. Keep electrical cords out of the way. Do not use floor polish or wax that makes floors slippery. If you must use wax, use non-skid floor wax. Do not have throw rugs and other things on the floor that can make you trip. What can I do with my stairs? Do not leave any  items on the stairs. Make sure that there are handrails on both sides of the stairs and use them. Fix handrails that are broken or loose. Make sure that handrails are as long as the stairways. Check any carpeting to make sure that it is firmly attached to the stairs. Fix any carpet that is loose or worn. Avoid having throw rugs at the top or bottom of the stairs. If you do have throw rugs, attach them to the floor with carpet tape. Make sure that you have a light switch at the top of the stairs and the bottom of the stairs. If you do not have them, ask someone to add them for you. What else can I do to help prevent falls? Wear shoes that: Do not  have high heels. Have rubber bottoms. Are comfortable and fit you well. Are closed at the toe. Do not wear sandals. If you use a stepladder: Make sure that it is fully opened. Do not climb a closed stepladder. Make sure that both sides of the stepladder are locked into place. Ask someone to hold it for you, if possible. Clearly mark and make sure that you can see: Any grab bars or handrails. First and last steps. Where the edge of each step is. Use tools that help you move around (mobility aids) if they are needed. These include: Canes. Walkers. Scooters. Crutches. Turn on the lights when you go into a dark area. Replace any light bulbs as soon as they burn out. Set up your furniture so you have a clear path. Avoid moving your furniture around. If any of your floors are uneven, fix them. If there are any pets around you, be aware of where they are. Review your medicines with your doctor. Some medicines can make you feel dizzy. This can increase your chance of falling. Ask your doctor what other things that you can do to help prevent falls. This information is not intended to replace advice given to you by your health care provider. Make sure you discuss any questions you have with your health care provider. Document Released: 10/06/2009 Document Revised: 05/17/2016 Document Reviewed: 01/14/2015 Elsevier Interactive Patient Education  2017 [employer].

## 2022-06-17 ENCOUNTER — Other Ambulatory Visit: Payer: Self-pay | Admitting: Family Medicine

## 2022-06-17 DIAGNOSIS — E538 Deficiency of other specified B group vitamins: Secondary | ICD-10-CM

## 2022-06-19 NOTE — Telephone Encounter (Signed)
LVM to schedule an appointment.

## 2022-06-20 ENCOUNTER — Ambulatory Visit
Admission: RE | Admit: 2022-06-20 | Discharge: 2022-06-20 | Disposition: A | Discharge status: Home / Self Care | Payer: Medicare Other | Source: Ambulatory Visit | Attending: Family Medicine | Admitting: Family Medicine

## 2022-06-20 DIAGNOSIS — Z1231 Encounter for screening mammogram for malignant neoplasm of breast: Secondary | ICD-10-CM | POA: Diagnosis not present

## 2022-06-22 ENCOUNTER — Other Ambulatory Visit: Payer: Self-pay | Admitting: Family Medicine

## 2022-06-22 DIAGNOSIS — I2510 Atherosclerotic heart disease of native coronary artery without angina pectoris: Secondary | ICD-10-CM

## 2022-07-12 ENCOUNTER — Other Ambulatory Visit: Payer: Self-pay | Admitting: Family Medicine

## 2022-07-12 ENCOUNTER — Ambulatory Visit (INDEPENDENT_AMBULATORY_CARE_PROVIDER_SITE_OTHER): Payer: Medicare Other | Admitting: Family Medicine

## 2022-07-12 VITALS — BP 128/72 | HR 99 | Temp 97.6°F | Ht 63.5 in | Wt 160.0 lb

## 2022-07-12 DIAGNOSIS — E039 Hypothyroidism, unspecified: Secondary | ICD-10-CM

## 2022-07-12 DIAGNOSIS — Z0000 Encounter for general adult medical examination without abnormal findings: Secondary | ICD-10-CM

## 2022-07-12 DIAGNOSIS — K649 Unspecified hemorrhoids: Secondary | ICD-10-CM

## 2022-07-12 DIAGNOSIS — E782 Mixed hyperlipidemia: Secondary | ICD-10-CM

## 2022-07-12 DIAGNOSIS — I10 Essential (primary) hypertension: Secondary | ICD-10-CM | POA: Diagnosis not present

## 2022-07-12 LAB — TSH: TSH: 0.02 u[IU]/mL — ABNORMAL LOW (ref 0.35–5.50)

## 2022-07-12 LAB — COMPREHENSIVE METABOLIC PANEL
ALT: 18 U/L (ref 0–35)
AST: 19 U/L (ref 0–37)
Albumin: 4.1 g/dL (ref 3.5–5.2)
Alkaline Phosphatase: 109 U/L (ref 39–117)
BUN: 15 mg/dL (ref 6–23)
CO2: 28 mEq/L (ref 19–32)
Calcium: 9.4 mg/dL (ref 8.4–10.5)
Chloride: 105 mEq/L (ref 96–112)
Creatinine, Ser: 0.87 mg/dL (ref 0.40–1.20)
GFR: 61.43 mL/min (ref >60.00)
Glucose, Bld: 115 mg/dL — ABNORMAL HIGH (ref 70–99)
Potassium: 4.1 mEq/L (ref 3.5–5.1)
Sodium: 143 mEq/L (ref 135–145)
Total Bilirubin: 0.4 mg/dL (ref 0.2–1.2)
Total Protein: 6.5 g/dL (ref 6.0–8.3)

## 2022-07-12 LAB — LIPID PANEL
Cholesterol: 128 mg/dL (ref 0–200)
HDL: 36.1 mg/dL — ABNORMAL LOW (ref >39.00)
LDL Cholesterol: 58 mg/dL (ref 0–99)
NonHDL: 91.83
Total CHOL/HDL Ratio: 4
Triglycerides: 170 mg/dL — ABNORMAL HIGH (ref 0.0–149.0)
VLDL: 34 mg/dL (ref 0.0–40.0)

## 2022-07-12 MED ORDER — LEVOTHYROXINE SODIUM 50 MCG PO TABS: 50.0000 ug | ORAL_TABLET | Freq: Every day | ORAL | 0 refills | Status: DC

## 2022-07-12 NOTE — Progress Notes (Signed)
Annual Exam   Chief Complaint:  Chief Complaint  Patient presents with   Medicare Wellness    Part 2     History of Present Illness:  [name] is a 86 y.o. [phone] who LMP was No LMP recorded. Patient is postmenopausal., presents today for her annual examination.    Rectal issues - worse with straining and constipation - there all the time and worse with consitpation  Nutrition She does get adequate calcium and Vitamin D in her diet. Diet: cheese, healthy Exercise: not currently    Social History   Tobacco Use  Smoking Status Never  Smokeless Tobacco Never   Social History   Substance and Sexual Activity  Alcohol Use No   Social History   Substance and Sexual Activity  Drug Use No     General Health Dentist in the last year: Yes Eye doctor: yes  Safety The patient wears seatbelts: yes.     The patient feels safe at home and in their relationships: yes.   Menstrual:  Symptoms of menopause: done  GYN She is not sexually active.   Breast Cancer Screening (Age 37-74):  Last Mammogram: 05/2022 The patient does want a mammogram this year.    Colon Cancer Screening:  Age 79-75 yo - benefits outweigh the risk. Adults 34-85 yo who have never been screened benefit.  Benefits: 134000 people in 2016 will be diagnosed and 49,000 will die - early detection helps [place]: Complications 2/2 to colonoscopy High Risk (Colonoscopy): genetic disorder (Lynch syndrome or familial adenomatous polyposis), personal hx of IBD, previous adenomatous polyp, or previous colorectal cancer, FamHx start 10 years before the age at diagnosis, increased in males and black race  Options:  FIT - looks for hemoglobin (blood in the stool) - specific and fairly sensitive - must be done annually Cologuard - looks for DNA and blood - more sensitive - therefore can have more false positives, every 3 years Colonoscopy - every 10 years if normal - sedation, bowl prep, must have someone  drive you  Shared decision making and the patient had decided to do has aged out.   Social History   Tobacco Use  Smoking Status Never  Smokeless Tobacco Never    Lung Cancer Screening (Ages 80-16): not applicable 2  Weight Wt Readings from Last 3 Encounters:  07/12/22 160 lb (72.6 kg)  06/11/22 161 lb (73 kg)  03/21/22 161 lb 8 oz (73.3 kg)   Patient has high BMI  BMI Readings from Last 1 Encounters:  07/12/22 27.90 kg/m     Chronic disease screening Blood pressure monitoring:  BP Readings from Last 3 Encounters:  07/12/22 128/72  04/25/22 134/74  04/02/22 134/74    Lipid Monitoring: Indication for screening: age >23, obesity, diabetes, family hx, CV risk factors.  Lipid screening: Yes  Lab Results  Component Value Date   CHOL 141 06/06/2021   HDL 33.50 (L) 06/06/2021   LDLCALC 62 10/15/2019   LDLDIRECT 84.0 06/06/2021   TRIG 221.0 (H) 06/06/2021   CHOLHDL 4 06/06/2021     Diabetes Screening: age >97, overweight, family hx, PCOS, hx of gestational diabetes, at risk ethnicity Diabetes Screening screening: Yes  Lab Results  Component Value Date   HGBA1C 5.5 10/03/2016     Past Medical History:  Diagnosis Date   Hyperlipidemia    Hypertension    Hypothyroid    Insomnia    Macular degeneration    -- Dr. Laurey Arrow (optometrist).  Taking Ocuvite.  Past Surgical History:  Procedure Laterality Date   LEFT HEART CATHETERIZATION WITH CORONARY ANGIOGRAM N/A 06/29/2014   Procedure: LEFT HEART CATHETERIZATION WITH CORONARY ANGIOGRAM;  Surgeon: Sinclair Grooms, MD;  Location: [facility] CATH LAB;  Service: Cardiovascular;  Laterality: N/A;    Prior to Admission medications   Medication Sig Start Date End Date Taking? Authorizing Provider  aspirin 81 MG EC tablet TAKE 1 TABLET (81 MG TOTAL) BY MOUTH DAILY. SWALLOW WHOLE. 06/04/17  Yes Archie Patten, MD  atorvastatin (LIPITOR) 40 MG tablet TAKE 1 TABLET BY MOUTH EVERY DAY 06/22/22  Yes Lesleigh Noe,  MD  Calcium Carb-Cholecalciferol (CALCIUM 600+D3) 600-200 MG-UNIT TABS Take 1 tablet by mouth daily.    Yes [provider]  cyanocobalamin (CVS VITAMIN B12) 1000 MCG tablet TAKE 1 TABLET BY MOUTH EVERY DAY 06/18/22  Yes Lesleigh Noe, MD  Esomeprazole Magnesium (NEXIUM 24HR PO) Take 20 mg by mouth daily.   Yes [provider]  famotidine (PEPCID) 20 MG tablet TAKE 1 TABLET BY MOUTH TWICE A DAY 08/07/21  Yes Lesleigh Noe, MD  levothyroxine (SYNTHROID) 75 MCG tablet TAKE 1 TABLET BY MOUTH DAILY BEFORE BREAKFAST. 07/26/21  Yes Lesleigh Noe, MD  losartan (COZAAR) 50 MG tablet Take 1 tablet (50 mg total) by mouth daily. 07/26/21  Yes Lesleigh Noe, MD  Multiple Vitamins-Minerals (OCUVITE PRESERVISION) TABS Take 1 tablet by mouth 2 (two) times a day.   Yes [provider]  ondansetron (ZOFRAN ODT) 4 MG disintegrating tablet Take 1 tablet (4 mg total) by mouth every 8 (eight) hours as needed for nausea or vomiting. 07/14/21  Yes Bedsole, Amy E, MD  ondansetron (ZOFRAN) 4 MG tablet Take 4 mg by mouth daily. 05/02/22  Yes [provider]  OVER THE COUNTER MEDICATION Take 1 capsule by mouth as needed (constipation). CVS brand Intestinal Defense peppermint oil   Yes [provider]  pantoprazole (PROTONIX) 40 MG tablet Take 1 tablet (40 mg total) by mouth daily. 07/14/21  Yes Bedsole, Amy E, MD  Probiotic Product (PROBIOTIC-10 PO) Take 1 each by mouth daily at 12 noon.   Yes [provider]    Allergies  Allergen Reactions   Lisinopril Cough    Gynecologic History: No LMP recorded. Patient is postmenopausal.  Obstetric History: [id number]  Social History   Socioeconomic History   Marital status: Widowed    Spouse name: Not on file   Number of children: 1   Years of education: 12   Highest education level: High school graduate  Occupational History   Occupation: Retired   Tobacco Use   Smoking status: Never   Smokeless tobacco: Never   Vaping Use   Vaping Use: Never used  Substance and Sexual Activity   Alcohol use: No   Drug use: No   Sexual activity: Not Currently  Other Topics Concern   Not on file  Social History Narrative   01/25/21   From: the area   Living: alone, has family nearby   Work: retired - Special educational needs teacher       Family: 1 child - Marya Amsler - 2 grandchildren and 2 great grandchildren      Enjoys: spend time with friends, reading      Exercise: not currently   Diet: pretty good, healthy foods, 3 meals a day      Safety   Seat belts: Yes    Guns: No   Safe in relationships: Yes    Social Determinants  of Health   Financial Resource Strain: Low Risk  (06/11/2022)   Overall Financial Resource Strain (CARDIA)    Difficulty of Paying Living Expenses: Not hard at all  Food Insecurity: No Food Insecurity (06/11/2022)   Hunger Vital Sign    Worried About Running Out of Food in the Last Year: Never true    Ran Out of Food in the Last Year: Never true  Transportation Needs: No Transportation Needs (06/11/2022)   PRAPARE - Hydrologist (Medical): No    Lack of Transportation (Non-Medical): No  Physical Activity: Insufficiently Active (06/11/2022)   Exercise Vital Sign    Days of Exercise per Week: 3 days    Minutes of Exercise per Session: 30 min  Stress: No Stress Concern Present (06/11/2022)   [place]    Feeling of Stress : Not at all  Social Connections: Moderately Integrated (06/11/2022)   Social Connection and Isolation Panel [NHANES]    Frequency of Communication with Friends and Family: More than three times a week    Frequency of Social Gatherings with Friends and Family: More than three times a week    Attends Religious Services: More than 4 times per year    Active Member of [employer] or Organizations: Yes    Attends Archivist Meetings: More than 4 times per year    Marital Status: Widowed   Intimate Partner Violence: Not At Risk (06/11/2022)   Humiliation, Afraid, Rape, and Kick questionnaire    Fear of Current or Ex-Partner: No    Emotionally Abused: No    Physically Abused: No    Sexually Abused: No    Family History  Problem Relation Age of Onset   Stroke Father    Breast cancer Cousin        maternal cousin   Diabetes Brother     Review of Systems  Constitutional:  Negative for chills and fever.  HENT:  Negative for congestion and sore throat.   Eyes:  Negative for blurred vision and double vision.  Respiratory:  Negative for shortness of breath.   Cardiovascular:  Negative for chest pain.  Gastrointestinal:  Negative for heartburn, nausea and vomiting.  Genitourinary: Negative.        Hemorrhoid?  Musculoskeletal: Negative.  Negative for myalgias.  Skin:  Negative for rash.  Neurological:  Negative for dizziness and headaches.  Endo/Heme/Allergies:  Does not bruise/bleed easily.  Psychiatric/Behavioral:  Negative for depression. The patient is not nervous/anxious.      Physical Exam BP 128/72   Pulse 99   Temp 97.6 F (36.4 C) (Temporal)   Ht 5' 3.5" (1.613 m)   Wt 160 lb (72.6 kg)   SpO2 96%   BMI 27.90 kg/m    BP Readings from Last 3 Encounters:  07/12/22 128/72  04/25/22 134/74  04/02/22 134/74      Physical Exam Exam conducted with a chaperone present.  Constitutional:      General: She is not in acute distress.    Appearance: She is well-developed. She is not diaphoretic.  HENT:     Head: Normocephalic and atraumatic.     Right Ear: External ear normal.     Left Ear: External ear normal.     Nose: Nose normal.  Eyes:     General: No scleral icterus.    Extraocular Movements: Extraocular movements intact.     Conjunctiva/sclera: Conjunctivae normal.  Cardiovascular:  Rate and Rhythm: Normal rate and regular rhythm.     Heart sounds: No murmur heard. Pulmonary:     Effort: Pulmonary effort is normal. No respiratory  distress.     Breath sounds: Normal breath sounds. No wheezing.  Abdominal:     General: Bowel sounds are normal. There is no distension.     Palpations: Abdomen is soft. There is no mass.     Tenderness: There is no abdominal tenderness. There is no guarding or rebound.  Genitourinary:    Rectum: External hemorrhoid and internal hemorrhoid present.  Musculoskeletal:        General: Normal range of motion.     Cervical back: Neck supple.  Lymphadenopathy:     Cervical: No cervical adenopathy.  Skin:    General: Skin is warm and dry.     Capillary Refill: Capillary refill takes less than 2 seconds.  Neurological:     Mental Status: She is alert and oriented to person, place, and time.     Deep Tendon Reflexes: Reflexes normal.  Psychiatric:        Mood and Affect: Mood normal.        Behavior: Behavior normal.     Results:  PHQ-9:  Flowsheet Row Clinical Support from 06/08/2021 in [place] at [place]  PHQ-9 Total Score 0         Assessment: 86 y.o. [phone] female here for routine annual physical examination.  Plan: Problem List Items Addressed This Visit       Cardiovascular and Mediastinum   Primary hypertension (Chronic)   Relevant Orders   Comprehensive metabolic panel   Hemorrhoids     Endocrine   Hypothyroidism (Chronic)   Relevant Orders   TSH     Other   Mixed hyperlipidemia   Relevant Orders   Lipid panel   Other Visit Diagnoses     Annual physical exam    -  Primary       Screening: -- Blood pressure screen normal -- cholesterol screening: will obtain -- Weight screening: overweight: continue to monitor -- Diabetes Screening: will obtain -- Nutrition: Encouraged healthy diet  The ASCVD Risk score (Arnett DK, et al., 2019) failed to calculate for the following reasons:   The 2019 ASCVD risk score is only valid for ages 85 to 34  -- Statin therapy for Age 75-75 with CVD risk >7.5%  Psych -- Depression screening (PHQ-9):   Flowsheet Row Clinical Support from 06/08/2021 in [place] [facility]  PHQ-9 Total Score 0        Safety -- tobacco screening: not using -- alcohol screening:  low-risk usage. -- no evidence of domestic violence or intimate partner violence.   Cancer Screening -- pap smear not collected per ASCCP guidelines -- family history of breast cancer screening: done. not at high risk. -- Mammogram -  up to date -- Colon cancer (age 29+)-- not indicated  Immunizations Immunization History  Administered Date(s) Administered   Fluad Quad(high Dose 65+) 08/29/2019, 09/22/2020, 09/19/2021   Influenza Whole 11/12/2007, 09/17/2008, 10/14/2009, 10/25/2010, 09/05/2011   Influenza, High Dose Seasonal PF 08/20/2016, 08/30/2017, 08/30/2017, 09/17/2018   Influenza-Unspecified 08/24/2013, 09/22/2014, 09/17/2018   PFIZER(Purple Top)SARS-COV-2 Vaccination 02/29/2020, 03/22/2020, 10/13/2020   Pfizer Covid-19 Vaccine Bivalent Booster 6yr & up 12/26/2021   Pneumococcal Conjugate-13 06/22/2015, 02/17/2018, 02/17/2018   Pneumococcal Polysaccharide-23 11/23/2004   Tdap 09/05/2011, 02/28/2018, 03/06/2018   Zoster Recombinat (Shingrix) 05/26/2018, 07/26/2018   Zoster, Live 01/09/2018    -- flu vaccine  up to date -- TDAP q10 years up to date -- Shingles (age >10) up to date -- PPSV-23 (19-64 with chronic disease or smoking) up to date -- PCV-13 (age >60) - one dose followed by PPSV-23 1 year later up to date -- Covid-19 Vaccine up to date   Encouraged healthy diet and exercise. Encouraged regular vision and dental care.    Lesleigh Noe, MD

## 2022-07-18 ENCOUNTER — Other Ambulatory Visit: Payer: Self-pay | Admitting: Family Medicine

## 2022-07-18 DIAGNOSIS — E039 Hypothyroidism, unspecified: Secondary | ICD-10-CM

## 2022-07-24 DIAGNOSIS — K219 Gastro-esophageal reflux disease without esophagitis: Secondary | ICD-10-CM | POA: Diagnosis not present

## 2022-07-24 DIAGNOSIS — K649 Unspecified hemorrhoids: Secondary | ICD-10-CM | POA: Diagnosis not present

## 2022-07-24 DIAGNOSIS — K589 Irritable bowel syndrome without diarrhea: Secondary | ICD-10-CM | POA: Diagnosis not present

## 2022-07-27 ENCOUNTER — Encounter: Payer: Self-pay | Admitting: Obstetrics and Gynecology

## 2022-07-27 ENCOUNTER — Ambulatory Visit: Payer: Medicare Other | Admitting: Obstetrics and Gynecology

## 2022-07-27 VITALS — BP 145/78 | HR 74

## 2022-07-27 DIAGNOSIS — N8110 Cystocele, unspecified: Secondary | ICD-10-CM

## 2022-07-27 NOTE — Progress Notes (Signed)
[facility]   Subjective:     Chief Complaint:  Chief Complaint  Patient presents with   Pessary Check   History of Present Illness: Kristina Carlson is a 86 y.o. female with stage II pelvic organ prolapse who presents for a pessary check. She is using a size #4 ring with support pessary. The pessary has been working well. She denies vaginal bleeding.  Past Medical History: Patient  has a past medical history of Hyperlipidemia, Hypertension, Hypothyroid, Insomnia, and Macular degeneration.   Past Surgical History: She  has a past surgical history that includes left heart catheterization with coronary angiogram (N/A, 06/29/2014).   Medications: She has a current medication list which includes the following prescription(s): aspirin ec, atorvastatin, calcium 600+d3, cvs vitamin b12, esomeprazole magnesium, famotidine, levothyroxine, losartan, ocuvite preservision, ondansetron, ondansetron, OVER THE COUNTER MEDICATION, pantoprazole, and probiotic product.   Allergies: Patient is allergic to lisinopril.   Social History: Patient  reports that she has never smoked. She has never used smokeless tobacco. She reports that she does not drink alcohol and does not use drugs.      Objective:    Physical Exam: BP (!) 145/78   Pulse 74  Gen: No apparent distress, A&O x 3. Detailed Urogynecologic Evaluation:  Pelvic Exam: Normal external female genitalia; Bartholin's and Skene's glands normal in appearance; urethral meatus normal in appearance, no urethral masses or discharge. The pessary was noted to be in place. It was removed and cleaned. Speculum exam revealed no lesions in the vagina. The pessary was replaced. It was comfortable to the patient and fit well.   Previous exam: POP-Q:    POP-Q   1                                            Aa   1                                           Ba   1                                              C    3                                             Gh   4                                            Pb   8                                            tvl    -0.5  Ap   -0.5                                            Bp   -5                                              D         Assessment/Plan:    Assessment: Kristina Carlson is a 86 y.o. with stage II pelvic organ prolapse here for a pessary check.   Plan: - Continue size #4 ring with support pessary - Follow up 3 months for pessary check  Jaquita Folds, MD  Time spent: I spent 15 minutes dedicated to the care of this patient on the date of this encounter to include pre-visit review of records, face-to-face time with the patient and post visit documentation.

## 2022-08-08 ENCOUNTER — Telehealth: Payer: Self-pay

## 2022-08-08 DIAGNOSIS — Z4689 Encounter for fitting and adjustment of other specified devices: Secondary | ICD-10-CM

## 2022-08-08 NOTE — Telephone Encounter (Signed)
Pt with pessary  Needs removal, cleaning, replacement  Pt does not feel she can do this on her own and does not want to drive to [facility]

## 2022-08-08 NOTE — Telephone Encounter (Signed)
Pt would like referral to OBGYN in [place]

## 2022-08-16 ENCOUNTER — Other Ambulatory Visit: Payer: Self-pay | Admitting: Family Medicine

## 2022-08-16 DIAGNOSIS — I10 Essential (primary) hypertension: Secondary | ICD-10-CM

## 2022-08-21 DIAGNOSIS — H353134 Nonexudative age-related macular degeneration, bilateral, advanced atrophic with subfoveal involvement: Secondary | ICD-10-CM | POA: Diagnosis not present

## 2022-08-21 DIAGNOSIS — H43813 Vitreous degeneration, bilateral: Secondary | ICD-10-CM | POA: Diagnosis not present

## 2022-08-23 ENCOUNTER — Other Ambulatory Visit (INDEPENDENT_AMBULATORY_CARE_PROVIDER_SITE_OTHER): Payer: Medicare Other

## 2022-08-23 ENCOUNTER — Other Ambulatory Visit: Payer: Self-pay | Admitting: Family Medicine

## 2022-08-23 DIAGNOSIS — E039 Hypothyroidism, unspecified: Secondary | ICD-10-CM | POA: Diagnosis not present

## 2022-08-23 LAB — TSH: TSH: 0.24 u[IU]/mL — ABNORMAL LOW (ref 0.35–5.50)

## 2022-08-23 MED ORDER — LEVOTHYROXINE SODIUM 25 MCG PO TABS: 25.0000 ug | ORAL_TABLET | Freq: Every day | ORAL | 1 refills | Status: DC

## 2022-08-28 DIAGNOSIS — H353133 Nonexudative age-related macular degeneration, bilateral, advanced atrophic without subfoveal involvement: Secondary | ICD-10-CM | POA: Diagnosis not present

## 2022-09-11 DIAGNOSIS — H353134 Nonexudative age-related macular degeneration, bilateral, advanced atrophic with subfoveal involvement: Secondary | ICD-10-CM | POA: Diagnosis not present

## 2022-09-17 ENCOUNTER — Other Ambulatory Visit: Payer: Self-pay | Admitting: Family Medicine

## 2022-09-17 DIAGNOSIS — E039 Hypothyroidism, unspecified: Secondary | ICD-10-CM

## 2022-09-18 ENCOUNTER — Other Ambulatory Visit: Payer: Self-pay | Admitting: Family Medicine

## 2022-09-18 DIAGNOSIS — I2510 Atherosclerotic heart disease of native coronary artery without angina pectoris: Secondary | ICD-10-CM

## 2022-09-23 ENCOUNTER — Other Ambulatory Visit: Payer: Self-pay | Admitting: Family Medicine

## 2022-09-23 DIAGNOSIS — E538 Deficiency of other specified B group vitamins: Secondary | ICD-10-CM

## 2022-10-05 ENCOUNTER — Ambulatory Visit (INDEPENDENT_AMBULATORY_CARE_PROVIDER_SITE_OTHER): Payer: Medicare Other | Admitting: Family Medicine

## 2022-10-05 VITALS — BP 122/60 | HR 93 | Temp 98.4°F | Ht 63.5 in | Wt 164.1 lb

## 2022-10-05 DIAGNOSIS — E039 Hypothyroidism, unspecified: Secondary | ICD-10-CM | POA: Diagnosis not present

## 2022-10-05 DIAGNOSIS — M25552 Pain in left hip: Secondary | ICD-10-CM | POA: Diagnosis not present

## 2022-10-05 LAB — TSH: TSH: 31.14 u[IU]/mL — ABNORMAL HIGH (ref 0.35–5.50)

## 2022-10-05 MED ORDER — PREDNISONE 20 MG PO TABS: ORAL_TABLET | ORAL | 0 refills | Status: DC

## 2022-10-05 NOTE — Patient Instructions (Addendum)
Continue heat on low back or hip.  Start the home stretching for physical therapy.  Complete prednisone taper... call if pain has not resolved after 2-3 weeks or so.   Tonight you can take tylenol  650 mg 2 tabs at bedtime... or take  ibuprofen  600 mg at bedtime on full stomach.   Stop at lab on way out.

## 2022-10-05 NOTE — Assessment & Plan Note (Signed)
Due for repeat evaluation of thyroid function after change in dose to 25 mg.  She has been on this dose for the last 2 months.

## 2022-10-05 NOTE — Assessment & Plan Note (Signed)
Pain is possibly due to left sciatica versus left trochanteric bursitis.  She is very tender over the trochanteric bursa but does have radiating pain down her leg.  We will treat if prednisone taper and home physical therapy as well as heat.  She will contact me if she is not improving as expected. She was instructed to start the prednisone tomorrow so that this evening she can use Tylenol or ibuprofen for pain.  Of note there is no red flags or indication for x-ray given no focal vertebral pain./no fall

## 2022-10-05 NOTE — Progress Notes (Signed)
Patient ID: Kristina Carlson, female    DOB: 1936-04-04, 86 y.o.   MRN: [id number]  This visit was conducted in person.  BP 122/60   Pulse 93   Temp 98.4 F (36.9 C) (Temporal)   Ht 5' 3.5" (1.613 m)   Wt 164 lb 2 oz (74.4 kg)   SpO2 97%   BMI 28.62 kg/m    CC:  Chief Complaint  Patient presents with   Sciatica    L side down leg x 3 days    Subjective:   HPI: Kristina Carlson is a 86 y.o. female  pt pof Dr. Verda Cumins with history of chronic back pain presenting on 10/05/2022 for Sciatica (L side down leg x 3 days)  She reports new onset pain in left buttock x 3 days. Pain radiating to left foot.  No numbness or weakness.  No fever  No perineal numbness.  Pain scale 9-10 out of 10.  Pain interfering with sleep.   No change in activity, no fall.    She has been using FreezeON and Asper Cream but it did not help.   No past back surgery.    She is due for repeat  TSH testing. ODse was decrease to 25 mg daily 2 months ago by previous PCP.      Relevant past medical, surgical, family and social history reviewed and updated as indicated. Interim medical history since our last visit reviewed. Allergies and medications reviewed and updated. Outpatient Medications Prior to Visit  Medication Sig Dispense Refill   aspirin 81 MG EC tablet TAKE 1 TABLET (81 MG TOTAL) BY MOUTH DAILY. SWALLOW WHOLE. 90 tablet 3   atorvastatin (LIPITOR) 40 MG tablet TAKE 1 TABLET BY MOUTH EVERY DAY 90 tablet 0   Calcium Carb-Cholecalciferol (CALCIUM 600+D3) 600-200 MG-UNIT TABS Take 1 tablet by mouth daily.      CVS VITAMIN B12 1000 MCG tablet TAKE 1 TABLET BY MOUTH EVERY DAY 90 tablet 0   famotidine (PEPCID) 20 MG tablet TAKE 1 TABLET BY MOUTH TWICE A DAY 60 tablet 0   levothyroxine (SYNTHROID) 25 MCG tablet Take 1 tablet (25 mcg total) by mouth daily before breakfast. 30 tablet 1   losartan (COZAAR) 50 MG tablet TAKE 1 TABLET BY MOUTH EVERY DAY 90 tablet 3   Multiple Vitamins-Minerals (OCUVITE  PRESERVISION) TABS Take 1 tablet by mouth 2 (two) times a day.     ondansetron (ZOFRAN) 4 MG tablet Take 4 mg by mouth every 8 (eight) hours as needed.     OVER THE COUNTER MEDICATION Take 1 capsule by mouth as needed (constipation). CVS brand Intestinal Defense peppermint oil     pantoprazole (PROTONIX) 40 MG tablet Take 1 tablet (40 mg total) by mouth daily. 30 tablet 3   Probiotic Product (PROBIOTIC-10 PO) Take 1 each by mouth daily at 12 noon.     tobramycin (TOBREX) 0.3 % ophthalmic solution SMARTSIG:In Eye(s)     Esomeprazole Magnesium (NEXIUM 24HR PO) Take 20 mg by mouth daily.     ondansetron (ZOFRAN ODT) 4 MG disintegrating tablet Take 1 tablet (4 mg total) by mouth every 8 (eight) hours as needed for nausea or vomiting. 20 tablet 0   No facility-administered medications prior to visit.     Per HPI unless specifically indicated in ROS section below Review of Systems  Constitutional:  Negative for fatigue and fever.  HENT:  Negative for congestion.   Eyes:  Negative for pain.  Respiratory:  Negative for  cough and shortness of breath.   Cardiovascular:  Negative for chest pain, palpitations and leg swelling.  Gastrointestinal:  Negative for abdominal pain.  Genitourinary:  Negative for dysuria and vaginal bleeding.  Musculoskeletal:  Positive for back pain.  Neurological:  Negative for syncope, light-headedness and headaches.  Psychiatric/Behavioral:  Negative for dysphoric mood.    Objective:  BP 122/60   Pulse 93   Temp 98.4 F (36.9 C) (Temporal)   Ht 5' 3.5" (1.613 m)   Wt 164 lb 2 oz (74.4 kg)   SpO2 97%   BMI 28.62 kg/m   Wt Readings from Last 3 Encounters:  10/05/22 164 lb 2 oz (74.4 kg)  07/12/22 160 lb (72.6 kg)  06/11/22 161 lb (73 kg)      Physical Exam Constitutional:      General: She is not in acute distress.    Appearance: Normal appearance. She is well-developed. She is not ill-appearing or toxic-appearing.  HENT:     Head: Normocephalic.      Right Ear: Hearing, tympanic membrane, ear canal and external ear normal. Tympanic membrane is not erythematous, retracted or bulging.     Left Ear: Hearing, tympanic membrane, ear canal and external ear normal. Tympanic membrane is not erythematous, retracted or bulging.     Nose: No mucosal edema or rhinorrhea.     Right Sinus: No maxillary sinus tenderness or frontal sinus tenderness.     Left Sinus: No maxillary sinus tenderness or frontal sinus tenderness.     Mouth/Throat:     Pharynx: Uvula midline.  Eyes:     General: Lids are normal. Lids are everted, no foreign bodies appreciated.     Conjunctiva/sclera: Conjunctivae normal.     Pupils: Pupils are equal, round, and reactive to light.  Neck:     Thyroid: No thyroid mass or thyromegaly.     Vascular: No carotid bruit.     Trachea: Trachea normal.  Cardiovascular:     Rate and Rhythm: Normal rate and regular rhythm.     Pulses: Normal pulses.     Heart sounds: Normal heart sounds, S1 normal and S2 normal. No murmur heard.    No friction rub. No gallop.  Pulmonary:     Effort: Pulmonary effort is normal. No tachypnea or respiratory distress.     Breath sounds: Normal breath sounds. No decreased breath sounds, wheezing, rhonchi or rales.  Abdominal:     General: Bowel sounds are normal.     Palpations: Abdomen is soft.     Tenderness: There is no abdominal tenderness.  Musculoskeletal:     Cervical back: Normal range of motion and neck supple.     Thoracic back: Normal.     Lumbar back: Tenderness present. No bony tenderness. Normal range of motion. Positive left straight leg raise test. Negative right straight leg raise test.     Left hip: Tenderness and bony tenderness present. Decreased range of motion.     Comments: Tender palpation over left trochanteric bursa  Skin:    General: Skin is warm and dry.     Findings: No rash.  Neurological:     Mental Status: She is alert.  Psychiatric:        Mood and Affect: Mood is  not anxious or depressed.        Speech: Speech normal.        Behavior: Behavior normal. Behavior is cooperative.        Thought Content: Thought content normal.  Judgment: Judgment normal.       Results for orders placed or performed in visit on 08/23/22  TSH  Result Value Ref Range   TSH 0.24 (L) 0.35 - 5.50 uIU/mL     COVID 19 screen:  No recent travel or known exposure to [place] The patient denies respiratory symptoms of COVID 19 at this time. The importance of social distancing was discussed today.   Assessment and Plan Problem List Items Addressed This Visit     Hypothyroidism (Chronic)    Due for repeat evaluation of thyroid function after change in dose to 25 mg.  She has been on this dose for the last 2 months.      Acute hip pain, left - Primary    Pain is possibly due to left sciatica versus left trochanteric bursitis.  She is very tender over the trochanteric bursa but does have radiating pain down her leg.  We will treat if prednisone taper and home physical therapy as well as heat.  She will contact me if she is not improving as expected. She was instructed to start the prednisone tomorrow so that this evening she can use Tylenol or ibuprofen for pain.  Of note there is no red flags or indication for x-ray given no focal vertebral pain./no fall          Eliezer Lofts, MD

## 2022-10-15 ENCOUNTER — Encounter: Payer: Self-pay | Admitting: *Deleted

## 2022-10-22 ENCOUNTER — Other Ambulatory Visit: Payer: Self-pay

## 2022-10-22 DIAGNOSIS — E039 Hypothyroidism, unspecified: Secondary | ICD-10-CM

## 2022-10-22 NOTE — Telephone Encounter (Signed)
Pt's last TSH was checked on 10/05/22, but I didn't see a result note for her TSH being 31.14. Pt is scheduled to see you again tomorrow for TOC.

## 2022-10-23 ENCOUNTER — Ambulatory Visit (INDEPENDENT_AMBULATORY_CARE_PROVIDER_SITE_OTHER): Payer: Medicare Other | Admitting: Family Medicine

## 2022-10-23 ENCOUNTER — Ambulatory Visit (INDEPENDENT_AMBULATORY_CARE_PROVIDER_SITE_OTHER)
Admission: RE | Admit: 2022-10-23 | Discharge: 2022-10-23 | Disposition: A | Discharge status: Home / Self Care | Payer: Medicare Other | Source: Ambulatory Visit | Attending: Family Medicine | Admitting: Family Medicine

## 2022-10-23 ENCOUNTER — Encounter: Payer: Self-pay | Admitting: Family Medicine

## 2022-10-23 VITALS — BP 158/80 | HR 77 | Temp 98.5°F | Ht 63.5 in | Wt 166.2 lb

## 2022-10-23 DIAGNOSIS — E559 Vitamin D deficiency, unspecified: Secondary | ICD-10-CM | POA: Diagnosis not present

## 2022-10-23 DIAGNOSIS — M25552 Pain in left hip: Secondary | ICD-10-CM

## 2022-10-23 DIAGNOSIS — N8110 Cystocele, unspecified: Secondary | ICD-10-CM

## 2022-10-23 DIAGNOSIS — I10 Essential (primary) hypertension: Secondary | ICD-10-CM

## 2022-10-23 DIAGNOSIS — I2510 Atherosclerotic heart disease of native coronary artery without angina pectoris: Secondary | ICD-10-CM

## 2022-10-23 DIAGNOSIS — E538 Deficiency of other specified B group vitamins: Secondary | ICD-10-CM

## 2022-10-23 DIAGNOSIS — K219 Gastro-esophageal reflux disease without esophagitis: Secondary | ICD-10-CM

## 2022-10-23 DIAGNOSIS — K5909 Other constipation: Secondary | ICD-10-CM

## 2022-10-23 DIAGNOSIS — E039 Hypothyroidism, unspecified: Secondary | ICD-10-CM

## 2022-10-23 DIAGNOSIS — M1612 Unilateral primary osteoarthritis, left hip: Secondary | ICD-10-CM | POA: Diagnosis not present

## 2022-10-23 DIAGNOSIS — E7849 Other hyperlipidemia: Secondary | ICD-10-CM

## 2022-10-23 MED ORDER — LEVOTHYROXINE SODIUM 75 MCG PO TABS: 37.5000 ug | ORAL_TABLET | Freq: Every day | ORAL | 3 refills | Status: DC

## 2022-10-23 NOTE — Assessment & Plan Note (Signed)
Medical management.  Has  Nitro , never uses.

## 2022-10-23 NOTE — Assessment & Plan Note (Signed)
Pessary in place.. followed by  GYN

## 2022-10-23 NOTE — Assessment & Plan Note (Signed)
Worsened lately likely due tot low thyroid.

## 2022-10-23 NOTE — Assessment & Plan Note (Signed)
She has stopped levo altoghther.  Will restart at 37.5 mg daily and recheck ing 4 weeks.

## 2022-10-23 NOTE — Assessment & Plan Note (Signed)
Resolved on supplement 

## 2022-10-23 NOTE — Assessment & Plan Note (Signed)
Well controlled on  Oral B12 vitamin.

## 2022-10-23 NOTE — Assessment & Plan Note (Signed)
Stable, chronic.  Continue current medication.   Atorvastatin 40 mg daily

## 2022-10-23 NOTE — Assessment & Plan Note (Addendum)
Most consistent with trochanteric bursitis on exam today.  Minimal improvement with oral prednisone. Will eval with X-ray but likely plan moving forward with consult with Dr. Lorelei Pont for  Possible injection in trochanteric bursa.

## 2022-10-23 NOTE — Progress Notes (Signed)
Patient ID: Kristina Carlson, female    DOB: 1936-09-02, 86 y.o.   MRN: [id number]  This visit was conducted in person.  BP (!) 158/80   Pulse 77   Temp 98.5 F (36.9 C) (Oral)   Ht 5' 3.5" (1.613 m)   Wt 166 lb 4 oz (75.4 kg)   SpO2 98%   BMI 28.99 kg/m    CC:  Chief Complaint  Patient presents with   Transitions Of Care   Hypothyroidism    Subjective:   HPI: RAFEEF Carlson is a 86 y.o. female presenting on 10/23/2022 for Transitions Of Care and Hypothyroidism   Previous PCP: Kristina Carlson Last AMW 06/2022   Hypothyroid : Dose was decrease to 25 mg daily 2 months ago by previous PCP. Lab Results  Component Value Date   TSH 31.14 (H) 10/05/2022    Continue left hip pain... minimal improvement with prednisone taper. Addressed at last OV 10/05/2022  No proceeding falls.   CAD, medical managment followed  Dr. Tamala Julian , last seen years ago.  Hypertension:   Usually well controlled on losartan 100 mg daily.  BP Readings from Last 3 Encounters:  10/23/22 (!) 158/80  10/05/22 122/60  07/27/22 (!) 145/78        Relevant past medical, surgical, family and social history reviewed and updated as indicated. Interim medical history since our last visit reviewed. Allergies and medications reviewed and updated. Outpatient Medications Prior to Visit  Medication Sig Dispense Refill   aspirin 81 MG EC tablet TAKE 1 TABLET (81 MG TOTAL) BY MOUTH DAILY. SWALLOW WHOLE. 90 tablet 3   atorvastatin (LIPITOR) 40 MG tablet TAKE 1 TABLET BY MOUTH EVERY DAY 90 tablet 0   Calcium Carb-Cholecalciferol (CALCIUM 600+D3) 600-200 MG-UNIT TABS Take 1 tablet by mouth daily.      CVS VITAMIN B12 1000 MCG tablet TAKE 1 TABLET BY MOUTH EVERY DAY 90 tablet 0   famotidine (PEPCID) 20 MG tablet TAKE 1 TABLET BY MOUTH TWICE A DAY 60 tablet 0   levothyroxine (SYNTHROID) 25 MCG tablet Take 1 tablet (25 mcg total) by mouth daily before breakfast. 30 tablet 1   losartan (COZAAR) 50 MG tablet TAKE 1 TABLET BY MOUTH  EVERY DAY 90 tablet 3   Multiple Vitamins-Minerals (OCUVITE PRESERVISION) TABS Take 1 tablet by mouth 2 (two) times a day.     ondansetron (ZOFRAN) 4 MG tablet Take 4 mg by mouth every 8 (eight) hours as needed.     pantoprazole (PROTONIX) 40 MG tablet Take 1 tablet (40 mg total) by mouth daily. 30 tablet 3   Probiotic Product (PROBIOTIC-10 PO) Take 1 each by mouth daily at 12 noon.     tobramycin (TOBREX) 0.3 % ophthalmic solution SMARTSIG:In Eye(s)     OVER THE COUNTER MEDICATION Take 1 capsule by mouth as needed (constipation). CVS brand Intestinal Defense peppermint oil     predniSONE (DELTASONE) 20 MG tablet 3 tabs by mouth daily x 3 days, then 2 tabs by mouth daily x 2 days then 1 tab by mouth daily x 2 days 15 tablet 0   No facility-administered medications prior to visit.     Per HPI unless specifically indicated in ROS section below Review of Systems  Constitutional:  Negative for fatigue and fever.  HENT:  Negative for congestion.   Eyes:  Negative for pain.  Respiratory:  Negative for cough and shortness of breath.   Cardiovascular:  Negative for chest pain, palpitations and leg swelling.  Gastrointestinal:  Negative for abdominal pain.  Genitourinary:  Negative for dysuria and vaginal bleeding.  Musculoskeletal:  Negative for back pain.  Neurological:  Negative for syncope, light-headedness and headaches.  Psychiatric/Behavioral:  Negative for dysphoric mood.    Objective:  BP (!) 158/80   Pulse 77   Temp 98.5 F (36.9 C) (Oral)   Ht 5' 3.5" (1.613 m)   Wt 166 lb 4 oz (75.4 kg)   SpO2 98%   BMI 28.99 kg/m   Wt Readings from Last 3 Encounters:  10/23/22 166 lb 4 oz (75.4 kg)  10/05/22 164 lb 2 oz (74.4 kg)  07/12/22 160 lb (72.6 kg)      Physical Exam Vitals and nursing note reviewed.  Constitutional:      General: She is not in acute distress.    Appearance: Normal appearance. She is well-developed. She is not ill-appearing or toxic-appearing.  HENT:      Head: Normocephalic.     Right Ear: Hearing, tympanic membrane, ear canal and external ear normal.     Left Ear: Hearing, tympanic membrane, ear canal and external ear normal.     Nose: Nose normal.  Eyes:     General: Lids are normal. Lids are everted, no foreign bodies appreciated.     Conjunctiva/sclera: Conjunctivae normal.     Pupils: Pupils are equal, round, and reactive to light.  Neck:     Thyroid: No thyroid mass or thyromegaly.     Vascular: No carotid bruit.     Trachea: Trachea normal.  Cardiovascular:     Rate and Rhythm: Normal rate and regular rhythm.     Heart sounds: Normal heart sounds, S1 normal and S2 normal. No murmur heard.    No gallop.  Pulmonary:     Effort: Pulmonary effort is normal. No respiratory distress.     Breath sounds: Normal breath sounds. No wheezing, rhonchi or rales.  Abdominal:     General: Bowel sounds are normal. There is no distension or abdominal bruit.     Palpations: Abdomen is soft. There is no fluid wave or mass.     Tenderness: There is no abdominal tenderness. There is no guarding or rebound.     Hernia: No hernia is present.  Musculoskeletal:     Cervical back: Normal range of motion and neck supple.  Lymphadenopathy:     Cervical: No cervical adenopathy.  Skin:    General: Skin is warm and dry.     Findings: No rash.  Neurological:     Mental Status: She is alert.     Cranial Nerves: No cranial nerve deficit.     Sensory: No sensory deficit.  Psychiatric:        Mood and Affect: Mood is not anxious or depressed.        Speech: Speech normal.        Behavior: Behavior normal. Behavior is cooperative.        Judgment: Judgment normal.       Results for orders placed or performed in visit on 10/05/22  TSH  Result Value Ref Range   TSH 31.14 (H) 0.35 - 5.50 uIU/mL     COVID 19 screen:  No recent travel or known exposure to [place] The patient denies respiratory symptoms of COVID 19 at this time. The importance of  social distancing was discussed today.   Assessment and Plan Problem List Items Addressed This Visit     B12 deficiency (Chronic)    Well controlled  on  Oral B12 vitamin.      Coronary artery disease (Chronic)    Medical management.  Has  Nitro , never uses.      Familial multiple lipoprotein-type hyperlipidemia (Chronic)    Stable, chronic.  Continue current medication.   Atorvastatin 40 mg daily      GERD (gastroesophageal reflux disease) (Chronic)     Chronic, stable on protonix 40 mg daily prn      Hypothyroidism (Chronic)     She has stopped levo altoghther.  Will restart at 37.5 mg daily and recheck ing 4 weeks.      Relevant Medications   levothyroxine (SYNTHROID) 75 MCG tablet   Other Relevant Orders   TSH   T4, free   T3, free   Primary hypertension (Chronic)    Stable, chronic.  Continue current medication.    losartan 100 mg daily.      Vitamin D deficiency, unspecified (Chronic)    Resolved on supplement      Acute hip pain, left - Primary     Most consistent with trochanteric bursitis on exam today.  Minimal improvement with oral prednisone. Will eval with X-ray but likely plan moving forward with consult with Dr. Lorelei Pont for  Possible injection in trochanteric bursa.      Relevant Orders   DG Hip Unilat W OR W/O Pelvis 2-3 Views Left   Chronic constipation     Worsened lately likely due tot low thyroid.      Female bladder prolapse     Pessary in place.. followed by  GYN          Eliezer Lofts, MD

## 2022-10-23 NOTE — Assessment & Plan Note (Signed)
Chronic, stable on protonix 40 mg daily prn

## 2022-10-23 NOTE — Patient Instructions (Addendum)
Restart levo at 1/2 tablet of 75 mg daily. Return 4-6 weeks for TSH, free T3 and free T4 W e will call with X-ray results.

## 2022-10-23 NOTE — Assessment & Plan Note (Signed)
Stable, chronic.  Continue current medication.    losartan 100 mg daily.

## 2022-10-26 ENCOUNTER — Ambulatory Visit: Payer: Medicare Other | Admitting: Obstetrics and Gynecology

## 2022-10-29 ENCOUNTER — Encounter: Payer: Self-pay | Admitting: Family Medicine

## 2022-10-29 ENCOUNTER — Ambulatory Visit (INDEPENDENT_AMBULATORY_CARE_PROVIDER_SITE_OTHER): Payer: Medicare Other | Admitting: Family Medicine

## 2022-10-29 VITALS — BP 150/80 | HR 98 | Temp 98.1°F | Ht 63.5 in | Wt 166.2 lb

## 2022-10-29 DIAGNOSIS — M25552 Pain in left hip: Secondary | ICD-10-CM | POA: Diagnosis not present

## 2022-10-29 DIAGNOSIS — M7062 Trochanteric bursitis, left hip: Secondary | ICD-10-CM | POA: Diagnosis not present

## 2022-10-29 MED ORDER — TRIAMCINOLONE ACETONIDE 40 MG/ML IJ SUSP
40.0000 mg | Freq: Once | INTRAMUSCULAR | Status: AC
  Administered 2022-10-29: 40 mg via INTRA_ARTICULAR

## 2022-10-29 NOTE — Progress Notes (Signed)
Kristina Fyock T. Boen Sterbenz, MD, [place] at [facility] [place], [address]  Phone: [phone]  FAX: [phone]  Kristina Carlson - 86 y.o. female  MRN [id number]  Date of Birth: 1936/12/10  Date: 10/29/2022  PCP: Waunita Schooner, MD  Referral: Waunita Schooner, MD  Chief Complaint  Patient presents with   Hip Pain    Left    Subjective:   Kristina Carlson is a 86 y.o. very pleasant female patient with Body mass index is 28.99 kg/m. who presents with the following:  She presents for consultation request by Dr. Diona Browner.  She is having some lateral hip pain on the left side with some question of possible trochanteric bursitis.  Has been hurting for several weeks, nothing will make better.  Interferes with sleep.   She points to the lateral aspect of her hip she.  She does not have any groin pain.  I did review her plain film, there is no significant joint space loss on the left hip.  Review of Systems is noted in the HPI, as appropriate  Objective:   BP (!) 150/80   Pulse 98   Temp 98.1 F (36.7 C) (Oral)   Ht 5' 3.5" (1.613 m)   Wt 166 lb 4 oz (75.4 kg)   SpO2 95%   BMI 28.99 kg/m   GEN: No acute distress; alert,appropriate. PULM: Breathing comfortably in no respiratory distress PSYCH: Normally interactive.    HIP EXAM: SIDE: Left ROM: Abduction, Flexion, Internal and External range of motion: Full Pain with terminal IROM and EROM: Minimal to none GTB: Tender to palpation on the left SLR: NEG Knees: No effusion FABER: NT REVERSE FABER: NT, neg Piriformis: NT at direct palpation Str: flexion: 5/5 abduction: 5/5 adduction: 5/5 Strength testing non-tender    Laboratory and Imaging Data:  Assessment and Plan:     ICD-10-CM   1. Trochanteric bursitis of left hip  M70.62     2. Acute hip pain, left  M25.552      Classic trochanteric bursitis.  We reviewed this together in the office, as well as the  anatomy.  She will take Tylenol and ice and ice her lateral hip.  Aspiration/Injection Procedure Note REONA ZENDEJAS Mar 24, 1936 Date of procedure: 10/29/2022  Procedure: Large Joint Aspiration / Injection of Hip, Trochanteric Bursa, L Indications: Pain  Procedure Details Verbal consent obtained. Risks, benefits, and alternatives reviewed. Greater trochanter sterilely prepped with Chloraprep. Ethyl Chloride used for anesthesia. 9 cc of Lidocaine 1% injected with 1 mL of Kenalog 40 mg into trochanteric bursa at area of maximal tenderness at greater trochanter. Needle taken to bone to troch bursa, flows easily. Bursa massaged. No bleeding and no complications. Decreased pain after injection. Needle: 22 gauge spinal needle Medication: 1 mL of Kenalog 40 mg   Disposition: No follow-ups on file.  Dragon [employer] One speech-to-text software was used for transcription in this dictation.  Possible transcriptional errors can occur using Editor, commissioning.   Signed,  Maud Deed. Sham Alviar, MD   Outpatient Encounter Medications as of 10/29/2022  Medication Sig   aspirin 81 MG EC tablet TAKE 1 TABLET (81 MG TOTAL) BY MOUTH DAILY. SWALLOW WHOLE.   atorvastatin (LIPITOR) 40 MG tablet TAKE 1 TABLET BY MOUTH EVERY DAY   Calcium Carb-Cholecalciferol (CALCIUM 600+D3) 600-200 MG-UNIT TABS Take 1 tablet by mouth daily.    CVS VITAMIN B12 1000 MCG tablet TAKE 1 TABLET BY MOUTH EVERY DAY  famotidine (PEPCID) 20 MG tablet TAKE 1 TABLET BY MOUTH TWICE A DAY   levothyroxine (SYNTHROID) 75 MCG tablet Take 0.5 tablets (37.5 mcg total) by mouth daily.   losartan (COZAAR) 50 MG tablet TAKE 1 TABLET BY MOUTH EVERY DAY   Multiple Vitamins-Minerals (OCUVITE PRESERVISION) TABS Take 1 tablet by mouth 2 (two) times a day.   ondansetron (ZOFRAN) 4 MG tablet Take 4 mg by mouth every 8 (eight) hours as needed.   pantoprazole (PROTONIX) 40 MG tablet Take 1 tablet (40 mg total) by mouth daily.   Probiotic Product (PROBIOTIC-10  PO) Take 1 each by mouth daily at 12 noon.   tobramycin (TOBREX) 0.3 % ophthalmic solution SMARTSIG:In Eye(s)   No facility-administered encounter medications on file as of 10/29/2022.

## 2022-10-29 NOTE — Addendum Note (Signed)
Addended by: Carter Kitten on: 10/29/2022 10:09 AM   Modules accepted: Orders

## 2022-10-30 ENCOUNTER — Ambulatory Visit: Payer: Medicare Other | Admitting: Obstetrics & Gynecology

## 2022-10-30 ENCOUNTER — Encounter: Payer: Self-pay | Admitting: Obstetrics & Gynecology

## 2022-10-30 VITALS — BP 177/78 | HR 79

## 2022-10-30 DIAGNOSIS — Z4689 Encounter for fitting and adjustment of other specified devices: Secondary | ICD-10-CM

## 2022-10-30 DIAGNOSIS — N8110 Cystocele, unspecified: Secondary | ICD-10-CM

## 2022-10-30 NOTE — Progress Notes (Addendum)
   GYNECOLOGY OFFICE VISIT NOTE  History:   Kristina Carlson is a 86 y.o. [phone] female with stage II pelvic organ prolapse who presents for pessary maintenance.  She is followed by Dr. Marilynne (Urogynecology) and has been using a size #4 ring with support pessary. The pessary has been working well, no complaints. She wants to have pessary maintenance here as she has difficulties getting to [place], this [facility] office is closer to her.  She denies any abnormal vaginal discharge, bleeding, pelvic pain or other concerns.    Past Medical History:  Diagnosis Date   Hyperlipidemia    Hypertension    Hypothyroid    Insomnia    Macular degeneration    -- Dr. Rudell Rue (optometrist).  Taking Ocuvite.    Past Surgical History:  Procedure Laterality Date   LEFT HEART CATHETERIZATION WITH CORONARY ANGIOGRAM N/A 06/29/2014   Procedure: LEFT HEART CATHETERIZATION WITH CORONARY ANGIOGRAM;  Surgeon: Victory LELON Claudene DOUGLAS, MD;  Location: [facility] CATH LAB;  Service: Cardiovascular;  Laterality: N/A;   The following portions of the patient's history were reviewed and updated as appropriate: allergies, current medications, past family history, past medical history, past social history, past surgical history and problem list.   Review of Systems:  Pertinent items noted in HPI and remainder of comprehensive ROS otherwise negative.  Physical Exam:  BP (!) 177/78   Pulse 79  CONSTITUTIONAL: Well-developed, well-nourished female in no acute distress.  MUSCULOSKELETAL: Normal range of motion. No edema noted. NEUROLOGIC: Alert and oriented to person, place, and time. Normal muscle tone coordination. No cranial nerve deficit noted. PSYCHIATRIC: Normal mood and affect. Normal behavior. Normal judgment and thought content. CARDIOVASCULAR: Normal heart rate noted RESPIRATORY: Effort and breath sounds normal, no problems with respiration noted ABDOMEN: No masses noted. No other overt distention noted.    PELVIC: Normal external female genitalia with moderate atrophy; Urethral meatus normal in appearance, no urethral masses or discharge. The pessary was noted to be in place. It was removed and cleaned. Exam revealed no lesions in the vagina. The pessary was replaced. It was comfortable to the patient and fit well.  Examination done in presence of chaperone     Assessment and Plan:     1. Stage II Pelvic Organ Prolapse 2. Pessary maintenance - Continue size #4 ring with support pessary - Follow up 3 months for pessary check - Will send message to Dr. Marilynne, encouraged patient to see her at least once a year and will follow up her recommendations.  Routine preventative health maintenance measures emphasized, follow up with PCP regarding elevated BP and other medical issues. Please refer to After Visit Summary for other counseling recommendations.   Return in about 3 months (around 01/30/2023) for Pessary maintenance.    I spent 20 minutes dedicated to the care of this patient including pre-visit review of records, face to face time with the patient discussing her conditions and treatments and post visit orders.   GLORIS HUGGER, MD, FACOG Obstetrician & Gynecologist, [facility] for [employer], [facility]

## 2022-11-08 DIAGNOSIS — H43813 Vitreous degeneration, bilateral: Secondary | ICD-10-CM | POA: Diagnosis not present

## 2022-11-08 DIAGNOSIS — H353134 Nonexudative age-related macular degeneration, bilateral, advanced atrophic with subfoveal involvement: Secondary | ICD-10-CM | POA: Diagnosis not present

## 2022-11-27 ENCOUNTER — Other Ambulatory Visit: Payer: Medicare Other

## 2022-12-04 ENCOUNTER — Other Ambulatory Visit (INDEPENDENT_AMBULATORY_CARE_PROVIDER_SITE_OTHER): Payer: Medicare Other

## 2022-12-04 DIAGNOSIS — E039 Hypothyroidism, unspecified: Secondary | ICD-10-CM

## 2022-12-04 LAB — T3, FREE: T3, Free: 2.5 pg/mL (ref 2.3–4.2)

## 2022-12-04 LAB — T4, FREE: Free T4: 0.71 ng/dL (ref 0.60–1.60)

## 2022-12-04 LAB — TSH: TSH: 16.32 u[IU]/mL — ABNORMAL HIGH (ref 0.35–5.50)

## 2022-12-19 ENCOUNTER — Telehealth: Payer: Self-pay | Admitting: Family Medicine

## 2022-12-19 ENCOUNTER — Other Ambulatory Visit: Payer: Self-pay | Admitting: *Deleted

## 2022-12-19 DIAGNOSIS — I2510 Atherosclerotic heart disease of native coronary artery without angina pectoris: Secondary | ICD-10-CM

## 2022-12-19 MED ORDER — ATORVASTATIN CALCIUM 40 MG PO TABS: 40.0000 mg | ORAL_TABLET | Freq: Every day | ORAL | 1 refills | Status: DC

## 2022-12-19 NOTE — Telephone Encounter (Signed)
Noted  

## 2022-12-19 NOTE — Telephone Encounter (Signed)
Patient called in and stated that her blood pressure was 178/100, 178/92, and 178/90. She stated she had a headache one day and no dizziness. Sent over to access nurse.

## 2022-12-19 NOTE — Telephone Encounter (Signed)
Adrienne RN with access nurse called; earlier in wk pt had elevated BP. Pt having mid to lt sided dull CP mostly at night that pt thinks might be reflux.pt does not think reflux med is working. Pt also has chronic lt leg pain; hurts worse with weight bearing and now pain level is 8. Pt said recently leg pain so bad pt feels like she might fall.(Pt has not fallen though). Pt does not have CP now, no dizziness, H/a or SOB. Pt said had CT scan done recently but report was OK. Pt has been taking losartan 50 mg one daily and has not missed medication. Pt cannot take BP at home; on 12/17/22 was at Christmas party when elevated BPs were taken upper 604V for systolic and 90  - 409 diastolic. No available appts at [facility] or [facility]. Pt will go to [place] this morning. Sending note to Dr Diona Browner who is out of office and [place] pool.     [facility] - Client TELEPHONE ADVICE RECORD AccessNurse Patient Name: Kristina Carlson ER Gender: Female DOB: 1936-07-24 Age: 86 Y 3 M 13 D Return Phone Number: [phone] (Primary) Address: City/ State/ Zip: [place] [address] Client [facility] - Client Client Site [facility] - Day Provider Waunita Schooner- MD Contact Type Call Who Is Calling Patient / Member / Family / Caregiver Call Type Triage / Clinical Relationship To Patient Self Return Phone Number [phone] (Primary) Chief Complaint Headache Reason for Call Symptomatic / Request for [place] states her blood pressure Monday was high with a headache. Caller states she has not been able to check it in the pass two days but she stinks it is from stress. Caller state Monday her blood pressure was 179/100, 178/92 and the last one 178/90. Translation No Nurse Assessment Nurse: D'Heur Lucia Gaskins, RN, Adrienne Date/Time (Eastern Time): 12/19/2022 9:13:19 AM Confirm and document  reason for call. If symptomatic, describe symptoms. ---Caller states her blood pressure Monday was high with a headache. Caller states she has not been able to check it in the pass two days but she stinks it is from stress. Caller state Monday her blood pressure was 179/100, 178/92 and the last one was 178/90. She denies current headache. She does say she is having stress, states her acid reflux med is not working. She is also having thyroid issues. She is c/o chronic leg pain and wants a shot. Does the patient have any new or worsening symptoms? ---Yes Will a triage be completed? ---Yes Related visit to physician within the last 2 weeks? ---No Does the PT have any chronic conditions? (i.e. diabetes, asthma, this includes High risk factors for pregnancy, etc.) ---Yes List chronic conditions. ---acid reflux, thyroid issues, chronic left leg pain, HTN, high cholesterol Is this a behavioral health or substance abuse call? ---No PLEASE NOTE: All timestamps contained within this report are represented as [place] Standard Time. CONFIDENTIALTY NOTICE: This fax transmission is intended only for the addressee. It contains information that is legally privileged, confidential or otherwise protected from use or disclosure. If you are not the intended recipient, you are strictly prohibited from reviewing, disclosing, copying using or disseminating any of this information or taking any action in reliance on or regarding this information. If you have received this fax in error, please notify us immediately by telephone so that we can arrange for its return to [place]. Phone: [phone], Toll-Free: [phone], Fax:  [phone] Page: 2 of 2 Call Id: [id number] Guidelines Guideline Title Affirmed Question Affirmed Notes Nurse Date/Time Eilene Ghazi Time) Leg Pain [1] Thigh or calf pain AND [2] only 1 side AND [3] present > 1 hour (Exception: Chronic unchanged pain.) D'Heur Lucia Gaskins, RN,  Adrienne 12/19/2022 9:17:16 AM Disp. Time Eilene Ghazi Time) Disposition Final User 12/19/2022 9:23:28 AM See HCP within 4 Hours (or PCP triage) Yes D'Heur Lucia Gaskins, RN, Adrienne Final Disposition 12/19/2022 9:23:28 AM See HCP within 4 Hours (or PCP triage) Yes D'Heur Lucia Gaskins, RN, Ebbie Latus Disagree/Comply Comply Caller Understands Yes PreDisposition Call Doctor Care Advice Given Per Guideline SEE HCP (OR PCP TRIAGE) WITHIN 4 HOURS: * IF OFFICE WILL BE OPEN: You need to be seen within the next 3 or 4 hours. Call your doctor (or NP/PA) now or as soon as the office opens. CALL EMS IF: * You develop any chest pain or shortness of breath. CALL BACK IF: * You become worse CARE ADVICE given per Leg Pain (Adult) guideline. Comments User: Vincente Liberty, D'Heur Lucia Gaskins, RN Date/Time Eilene Ghazi Time): 12/19/2022 9:21:10 AM Caller states she has been in th ED for chest pain and they told her it is was acid reflux. She only feels it when lying down. She denies pain right now, just a little soreness in the middle. User: Vincente Liberty, D'Heur Lucia Gaskins, RN Date/Time Eilene Ghazi Time): 12/19/2022 9:24:52 AM Caller states she had a steroid shot in her leg a month ago. User: Vincente Liberty, D'Heur Lucia Gaskins, RN Date/Time Eilene Ghazi Time): 12/19/2022 9:25:08 AM Office called as per directives. Referrals REFERRED TO PCP OFFIC

## 2022-12-25 ENCOUNTER — Other Ambulatory Visit: Payer: Self-pay | Admitting: Family Medicine

## 2022-12-25 ENCOUNTER — Telehealth: Payer: Self-pay

## 2022-12-25 DIAGNOSIS — E538 Deficiency of other specified B group vitamins: Secondary | ICD-10-CM

## 2022-12-25 NOTE — Telephone Encounter (Signed)
Attempted to reach patient regarding message left with answering service on. Left voicemail for patient to call office or send mychart message with details regarding appointment needs.

## 2022-12-26 NOTE — Progress Notes (Unsigned)
    Kalana Yust T. Micaella Gitto, MD, [place] at [facility] [place], [address]  Phone: [phone]  FAX: [phone]  Kristina Carlson - 87 y.o. female  MRN [id number]  Date of Birth: 03-22-1936  Date: 12/27/2022  PCP: Jinny Sanders, MD  Referral: Jinny Sanders, MD  No chief complaint on file.  Subjective:   Kristina Carlson is a 87 y.o. very pleasant female patient with There is no height or weight on file to calculate BMI. who presents with the following:  Pleasant 87 year old patient presents with some ongoing left-sided hip pain.  I did see her on October 29, 2022, at that point I felt like she was having some trochanteric bursitis.  At that point, did do a trochanteric bursa injection.  She presents today with some recurrent symptoms and is here for evaluation and recommendations.    Review of Systems is noted in the HPI, as appropriate  Objective:   There were no vitals taken for this visit.  GEN: No acute distress; alert,appropriate. PULM: Breathing comfortably in no respiratory distress PSYCH: Normally interactive.   Laboratory and Imaging Data:  Assessment and Plan:   ***

## 2022-12-27 ENCOUNTER — Ambulatory Visit (INDEPENDENT_AMBULATORY_CARE_PROVIDER_SITE_OTHER): Payer: Medicare Other | Admitting: Family Medicine

## 2022-12-27 ENCOUNTER — Encounter: Payer: Self-pay | Admitting: Family Medicine

## 2022-12-27 VITALS — BP 138/82 | HR 95 | Temp 97.7°F | Ht 63.5 in | Wt 164.0 lb

## 2022-12-27 DIAGNOSIS — I2510 Atherosclerotic heart disease of native coronary artery without angina pectoris: Secondary | ICD-10-CM | POA: Diagnosis not present

## 2022-12-27 DIAGNOSIS — M7062 Trochanteric bursitis, left hip: Secondary | ICD-10-CM

## 2022-12-27 DIAGNOSIS — R079 Chest pain, unspecified: Secondary | ICD-10-CM

## 2022-12-27 LAB — TROPONIN I (HIGH SENSITIVITY): High Sens Troponin I: 4 ng/L (ref 2–17)

## 2022-12-27 MED ORDER — TRIAMCINOLONE ACETONIDE 40 MG/ML IJ SUSP
40.0000 mg | Freq: Once | INTRAMUSCULAR | Status: AC
  Administered 2022-12-27: 40 mg via INTRA_ARTICULAR

## 2023-01-01 ENCOUNTER — Telehealth: Payer: Self-pay | Admitting: Family Medicine

## 2023-01-01 DIAGNOSIS — E538 Deficiency of other specified B group vitamins: Secondary | ICD-10-CM

## 2023-01-01 DIAGNOSIS — H43813 Vitreous degeneration, bilateral: Secondary | ICD-10-CM | POA: Diagnosis not present

## 2023-01-01 DIAGNOSIS — H353134 Nonexudative age-related macular degeneration, bilateral, advanced atrophic with subfoveal involvement: Secondary | ICD-10-CM | POA: Diagnosis not present

## 2023-01-01 DIAGNOSIS — E782 Mixed hyperlipidemia: Secondary | ICD-10-CM

## 2023-01-01 DIAGNOSIS — E559 Vitamin D deficiency, unspecified: Secondary | ICD-10-CM

## 2023-01-01 DIAGNOSIS — H35363 Drusen (degenerative) of macula, bilateral: Secondary | ICD-10-CM | POA: Diagnosis not present

## 2023-01-01 DIAGNOSIS — E039 Hypothyroidism, unspecified: Secondary | ICD-10-CM

## 2023-01-01 NOTE — Telephone Encounter (Signed)
-----   Message from Velna Hatchet, RT sent at 12/28/2022 10:35 AM EST ----- Regarding: Mon 1/15 lab Future lab order needed for labs only appt on 01/07/23, please.  Thanks, Anda Kraft

## 2023-01-07 ENCOUNTER — Other Ambulatory Visit (INDEPENDENT_AMBULATORY_CARE_PROVIDER_SITE_OTHER): Payer: Medicare Other

## 2023-01-07 DIAGNOSIS — E782 Mixed hyperlipidemia: Secondary | ICD-10-CM | POA: Diagnosis not present

## 2023-01-07 DIAGNOSIS — E538 Deficiency of other specified B group vitamins: Secondary | ICD-10-CM

## 2023-01-07 DIAGNOSIS — E559 Vitamin D deficiency, unspecified: Secondary | ICD-10-CM | POA: Diagnosis not present

## 2023-01-07 DIAGNOSIS — E039 Hypothyroidism, unspecified: Secondary | ICD-10-CM | POA: Diagnosis not present

## 2023-01-07 LAB — LIPID PANEL
Cholesterol: 148 mg/dL (ref 0–200)
HDL: 43.5 mg/dL (ref >39.00)
LDL Cholesterol: 81 mg/dL (ref 0–99)
NonHDL: 104.6
Total CHOL/HDL Ratio: 3
Triglycerides: 116 mg/dL (ref 0.0–149.0)
VLDL: 23.2 mg/dL (ref 0.0–40.0)

## 2023-01-07 LAB — COMPREHENSIVE METABOLIC PANEL
ALT: 28 U/L (ref 0–35)
AST: 22 U/L (ref 0–37)
Albumin: 3.9 g/dL (ref 3.5–5.2)
Alkaline Phosphatase: 87 U/L (ref 39–117)
BUN: 15 mg/dL (ref 6–23)
CO2: 29 mEq/L (ref 19–32)
Calcium: 9.2 mg/dL (ref 8.4–10.5)
Chloride: 104 mEq/L (ref 96–112)
Creatinine, Ser: 1.03 mg/dL (ref 0.40–1.20)
GFR: 49.99 mL/min — ABNORMAL LOW (ref >60.00)
Glucose, Bld: 132 mg/dL — ABNORMAL HIGH (ref 70–99)
Potassium: 3.9 mEq/L (ref 3.5–5.1)
Sodium: 143 mEq/L (ref 135–145)
Total Bilirubin: 0.6 mg/dL (ref 0.2–1.2)
Total Protein: 6.1 g/dL (ref 6.0–8.3)

## 2023-01-07 LAB — VITAMIN B12: Vitamin B-12: >1500 pg/mL — ABNORMAL HIGH (ref 211–911)

## 2023-01-07 LAB — T3, FREE: T3, Free: 2.6 pg/mL (ref 2.3–4.2)

## 2023-01-07 LAB — T4, FREE: Free T4: 0.76 ng/dL (ref 0.60–1.60)

## 2023-01-07 LAB — VITAMIN D 25 HYDROXY (VIT D DEFICIENCY, FRACTURES): VITD: 28.16 ng/mL — ABNORMAL LOW (ref 30.00–100.00)

## 2023-01-07 LAB — TSH: TSH: 13.68 u[IU]/mL — ABNORMAL HIGH (ref 0.35–5.50)

## 2023-01-08 DIAGNOSIS — H02054 Trichiasis without entropian left upper eyelid: Secondary | ICD-10-CM | POA: Diagnosis not present

## 2023-01-10 ENCOUNTER — Other Ambulatory Visit
Admission: RE | Admit: 2023-01-10 | Discharge: 2023-01-10 | Disposition: A | Discharge status: Home / Self Care | Payer: Medicare Other | Source: Ambulatory Visit | Attending: Cardiology | Admitting: Cardiology

## 2023-01-10 ENCOUNTER — Ambulatory Visit: Payer: Medicare Other | Attending: Cardiology | Admitting: Cardiology

## 2023-01-10 ENCOUNTER — Encounter: Payer: Self-pay | Admitting: Cardiology

## 2023-01-10 VITALS — BP 153/82 | HR 103 | Ht 64.0 in | Wt 162.8 lb

## 2023-01-10 DIAGNOSIS — I25118 Atherosclerotic heart disease of native coronary artery with other forms of angina pectoris: Secondary | ICD-10-CM

## 2023-01-10 DIAGNOSIS — R072 Precordial pain: Secondary | ICD-10-CM | POA: Diagnosis not present

## 2023-01-10 DIAGNOSIS — I10 Essential (primary) hypertension: Secondary | ICD-10-CM

## 2023-01-10 LAB — BASIC METABOLIC PANEL
Anion gap: 7 (ref 5–15)
BUN: 14 mg/dL (ref 8–23)
CO2: 28 mmol/L (ref 22–32)
Calcium: 9.1 mg/dL (ref 8.9–10.3)
Chloride: 105 mmol/L (ref 98–111)
Creatinine, Ser: 0.91 mg/dL (ref 0.44–1.00)
GFR, Estimated: >60 mL/min (ref >60)
Glucose, Bld: 100 mg/dL — ABNORMAL HIGH (ref 70–99)
Potassium: 4.1 mmol/L (ref 3.5–5.1)
Sodium: 140 mmol/L (ref 135–145)

## 2023-01-10 MED ORDER — METOPROLOL TARTRATE 100 MG PO TABS: 100.0000 mg | ORAL_TABLET | Freq: Once | ORAL | 0 refills | Status: DC

## 2023-01-10 NOTE — Patient Instructions (Addendum)
Medication Instructions:  No changes at this time.   *If you need a refill on your cardiac medications before your next appointment, please call your pharmacy*   Lab Work: [facility] today over at the [facility]. Stop at registration desk to check in.   If you have labs (blood work) drawn today and your tests are completely normal, you will receive your results only by: [place] (if you have MyChart) OR A paper copy in the mail If you have any lab test that is abnormal or we need to change your treatment, we will call you to review the results.   Testing/Procedures:   Your cardiac CT is scheduled at the below location:    [facility] [place], [place] [address] [phone]  Scheduled for 01/24/22 at 3:30 pm. Please arrive at 3:15 pm for registration and check in.   If scheduled at [facility] or [facility], please arrive 15 mins early for check-in and test prep.   Please follow these instructions carefully (unless otherwise directed):  Hold all erectile dysfunction medications at least 3 days (72 hrs) prior to test. (Ie viagra, cialis, sildenafil, tadalafil, etc) We will administer nitroglycerin during this exam.   On the Night Before the Test: Be sure to Drink plenty of water. Do not consume any caffeinated/decaffeinated beverages or chocolate 12 hours prior to your test. Do not take any antihistamines 12 hours prior to your test.   On the Day of the Test: Drink plenty of water until 1 hour prior to the test. Do not eat any food 1 hour prior to test. You may take your regular medications prior to the test.  Take metoprolol (Lopressor) 100 mg two hours prior to test. HOLD Furosemide/Hydrochlorothiazide morning of the test. FEMALES- please wear underwire-free bra if available, avoid dresses & tight clothing       After the Test: Drink plenty of  water. After receiving IV contrast, you may experience a mild flushed feeling. This is normal. On occasion, you may experience a mild rash up to 24 hours after the test. This is not dangerous. If this occurs, you can take Benadryl 25 mg and increase your fluid intake. If you experience trouble breathing, this can be serious. If it is severe call 911 IMMEDIATELY. If it is mild, please call our office. If you take any of these medications: Glipizide/Metformin, Avandament, Glucavance, please do not take 48 hours after completing test unless otherwise instructed.  We will call to schedule your test 2-4 weeks out understanding that some insurance companies will need an authorization prior to the service being performed.   For non-scheduling related questions, please contact the cardiac imaging nurse navigator should you have any questions/concerns: Marchia Bond, Cardiac Imaging Nurse Navigator Gordy Clement, Cardiac Imaging Nurse Navigator [facility] Direct Office Dial: [phone]   For scheduling needs, including cancellations and rescheduling, please call [place], [phone].    Follow-Up: At [facility], you and your health needs are our priority.  As part of our continuing mission to provide you with exceptional heart care, we have created designated Provider Care Teams.  These Care Teams include your primary Cardiologist (physician) and Advanced Practice Providers (APPs -  Physician Assistants and Nurse Practitioners) who all work together to provide you with the care you need, when you need it.   Your next appointment:   Follow up after testing has been done.  Provider:   You may see Dr. Glenetta Hew or one of the following Advanced Practice Providers on your designated Care Team:   Murray Hodgkins, NP Christell Faith, PA-C Cadence Kathlen Mody, PA-C Gerrie Nordmann, NP

## 2023-01-10 NOTE — Progress Notes (Signed)
Primary Care Provider: Jinny Sanders, MD [place] Cardiologist: None Electrophysiologist: None  Referring Provider: Dr. Owens Loffler   [facility] Note: Chief Complaint  Patient presents with   Chest Pain    Referred for Chest pain and Coronary artery disease.The patient states that he chest feels like it is burning. She states that its uncomfortable and she can't get sleep. The burning happens mostly at night. Meds reviewed with patient.    New Patient (Initial Visit)    Referred for chest pain   ===================================  ASSESSMENT/PLAN   Problem List Items Addressed This Visit       Cardiology Problems   Essential hypertension (Chronic)    Blood pressure is elevated today which typically for an 87 year old is not overly concerning at current levels.  However we do have room to start a beta-blocker if indicated based on Coronary CTA results.  Continue losartan for now, but low threshold to consider beta-blocker      Relevant Orders   Basic metabolic panel (Completed)   Coronary artery disease involving native coronary artery with other form of angina pectoris, unspecified whether native or transplanted heart ([place]) (Chronic)    She did have moderate disease on her cardiac catheterization back in 2017.  Now having recurrent episodes of chest pain that do sound musculoskeletal or GERD related, but for reassurance purposes of things not unreasonable to reassess her coronary arteries.  Concern for possible false negatives or false positives with stress test, therefore I think Coronary CTA is the best option to assess anatomy and physiology.  Definitive evaluation with Coronary CTA will allow [place] to provide reassurance if negative and potentially pursue invasive options if positive.  She is on atorvastatin, aspirin and ARB, would consider beta-blocker since she is tachycardic.  Will wait to see the  results.  Pending findings, may want to be a little more aggressive with cholesterol level showing LDL of 81.  However for now until reassessed, can stay with current therapy..        Other   Precordial pain - Primary    Precordial chest pain in a patient who does have moderate nonobstructive disease by catheterization 8 years ago.  Unusual that her symptoms are at rest and not necessarily exertional.  She says that she is actually having some of the discomfort while she was talking to me. I think is reasonable to assess for coronary ischemia-I think the best option would be Coronary CTA which gives [place] both anatomy and physiology (if FFR ct ordered).  Plan: Will check Coronary CTA which requires chemistry panel and evaluation.  This will help guide further therapy. Would probably consider beta-blocker given her elevated blood pressure and tachycardia.  She is only on 50 mg losartan. Agree with aspirin and statin.      Relevant Orders   EKG 12-Lead (Completed)   CT CORONARY MORPH W/CTA COR W/SCORE W/CA W/CM &/OR WO/CM   Basic metabolic panel (Completed)    ===================================  HPI:    LANISHA STEPANIAN is a 87 y.o. female with PMH notable for Essential Hypertension, HLD, hypothyroidism and Nonobstructive CAD by cath (2015) who is being seen today for the evaluation of CHEST PAIN at the request of Jinny Sanders, MD and Owens Loffler, MD.  Sherlie Ban was last seen by Dr. Pernell Dupre on April 21, 2019 via telemedicine in response to her recent emergency room visit.  EKG was normal at  the time and cardiac markers were unremarkable.  Chest pain was noted to be focal parasternal that wakes her from sleep.  7 out of 10.  Would go away when she would get up and start moving.  Pain can last for hours, aching.  No radiation.  Was having 3 out of 10 pain on the phone call.  Similar to what led to her coronary angiography in 2015. => Symptoms were felt to be noncardiac in nature.  He  did prescribe her nitroglycerin PRN.  Plan was for telephone follow-up after 2 to 3 days.  Recent Hospitalizations: None  She was seen on January 4 by Dr. Lorelei Pont ([place] [facility])   Reviewed  CV studies:    The following studies were reviewed today: (if available, images/films reviewed: From Epic Chart or Care Everywhere) CARDIAC CATH 06/2014: Mild LAD 50%, OM 1 30%, 50% ostial RCA:  Interval History:   SHANEISHA BURKEL presents here today for follow-up evaluation to evaluate chest pain. She has been noticing for the last almost a month or so that she is has been having intermittent episodes of diffuse chest pain that seem to start in the center of the chest and radiate to both sides.  She said that she was taking both Protonix and Pepcid to try to treat GERD symptoms, but they are not helping.  Also Nexium did not relieve it at all.  She says that it is usually worse when she lies down flat or if she lies on her left side.  She is better if she lifts the head of her bed up or sits up. She says that the discomfort is not necessarily associated with exertion, and she does not notice that it is better or worse if she gets up to walk around.  She says when the chest hurts, she can feel her pulsations up in her upper face and front of her ears. She says these episodes usually occur at night and it makes it hard for her to sleep, but it does not necessarily wake her up from sleep.  She will oftentimes notice it after she gets up to go to the bathroom and then it is hard for her to get back to sleep.  She says that the episodes can occur during the day in fact she is feeling a little bit of it right now at roughly 2-3 out of 10.  CV Review of Symptoms (Summary): Cardiovascular ROS: positive for - chest pain, palpitations, and she has been somewhat sedentary with not much exercise.  Not very active -> therefore we will get some exertional dyspnea.. negative for - edema,  irregular heartbeat, orthopnea, paroxysmal nocturnal dyspnea, rapid heart rate, or lightheadedness, dizziness or wooziness, syncope/near syncope or TIA/amaurosis fugax, claudication  REVIEWED OF SYSTEMS   Review of Systems  Constitutional:  Positive for malaise/fatigue (Sedentary, does not really do much.). Negative for weight loss.  HENT:  Negative for congestion.   Respiratory:  Positive for shortness of breath (Baseline exertional dyspnea). Negative for cough.   Gastrointestinal:  Positive for heartburn. Negative for abdominal pain, blood in stool and melena.  Genitourinary:  Negative for hematuria.  Musculoskeletal:  Negative for back pain, falls and myalgias.  Neurological:  Negative for dizziness, focal weakness and weakness.  Endo/Heme/Allergies:  Does not bruise/bleed easily.  Psychiatric/Behavioral:  Positive for memory loss (Some, but not significant). Negative for depression. The patient is nervous/anxious. The patient does not have insomnia.   All other  systems reviewed and are negative.  I have reviewed and (if needed) personally updated the patient's problem list, medications, allergies, past medical and surgical history, social and family history.   PAST MEDICAL HISTORY   Past Medical History:  Diagnosis Date   Hyperlipidemia    Hypertension    Hypothyroid    Insomnia    Macular degeneration    -- Dr. Laurey Arrow (optometrist).  Taking Ocuvite.   Non-occlusive coronary artery disease 06/2014   CARDIAC CATH 06/2014: Mild LAD 50%, OM 1 30%, 50% ostial RCA:    PAST SURGICAL HISTORY   Past Surgical History:  Procedure Laterality Date   LEFT HEART CATHETERIZATION WITH CORONARY ANGIOGRAM N/A 06/29/2014   Procedure: LEFT HEART CATHETERIZATION WITH CORONARY ANGIOGRAM;  Surgeon: Sinclair Grooms, MD;  Location: [facility] CATH LAB;  Service: Cardiovascular;: Mild LAD 50%, OM 1 30%, 50% ostial RCA:    Immunization History  Administered Date(s) Administered   Fluad Quad(high  Dose 65+) 08/29/2019, 09/22/2020, 09/19/2021, 09/28/2022   Influenza Whole 11/12/2007, 09/17/2008, 10/14/2009, 10/25/2010, 09/05/2011   Influenza, High Dose Seasonal PF 08/20/2016, 08/30/2017, 08/30/2017, 09/17/2018   Influenza-Unspecified 08/24/2013, 09/22/2014, 09/17/2018   PFIZER(Purple Top)SARS-COV-2 Vaccination 02/29/2020, 03/22/2020, 10/13/2020   Pfizer Covid-19 Vaccine Bivalent Booster 79yr & up 12/26/2021   Pneumococcal Conjugate-13 06/22/2015, 02/17/2018, 02/17/2018   Pneumococcal Polysaccharide-23 11/23/2004   Tdap 09/05/2011, 02/28/2018, 03/06/2018   Zoster Recombinat (Shingrix) 05/26/2018, 07/26/2018   Zoster, Live 01/09/2018    MEDICATIONS/ALLERGIES   Current Meds  Medication Sig   aspirin 81 MG EC tablet TAKE 1 TABLET (81 MG TOTAL) BY MOUTH DAILY. SWALLOW WHOLE.   atorvastatin (LIPITOR) 40 MG tablet Take 1 tablet (40 mg total) by mouth daily.   Calcium Carb-Cholecalciferol (CALCIUM 600+D3) 600-200 MG-UNIT TABS Take 1 tablet by mouth daily.    CVS VITAMIN B12 1000 MCG tablet TAKE 1 TABLET BY MOUTH EVERY DAY   famotidine (PEPCID) 20 MG tablet TAKE 1 TABLET BY MOUTH TWICE A DAY   FIBER PO Take by mouth daily. Takes gummies   levothyroxine (SYNTHROID) 75 MCG tablet Take 0.5 tablets (37.5 mcg total) by mouth daily.   losartan (COZAAR) 50 MG tablet TAKE 1 TABLET BY MOUTH EVERY DAY   Multiple Vitamins-Minerals (OCUVITE PRESERVISION) TABS Take 1 tablet by mouth 2 (two) times a day.   ondansetron (ZOFRAN) 4 MG tablet Take 4 mg by mouth every 8 (eight) hours as needed.   pantoprazole (PROTONIX) 40 MG tablet Take 1 tablet (40 mg total) by mouth daily.   tobramycin (TOBREX) 0.3 % ophthalmic solution SMARTSIG:In Eye(s)    Allergies  Allergen Reactions   Lisinopril Cough    SOCIAL HISTORY/FAMILY HISTORY   Reviewed in Epic:   Social History   Tobacco Use   Smoking status: Never   Smokeless tobacco: Never  Vaping Use   Vaping Use: Never used  Substance Use Topics    Alcohol use: No   Drug use: No   Social History   Social History Narrative   01/25/21   From: the area   Living: alone, has family nearby   Work: retired - hSpecial educational needs teacher      Family: 1 child - GMarya Amsler- 2 grandchildren and 2 great grandchildren      Enjoys: spend time with friends, reading      Exercise: not currently   Diet: pretty good, healthy foods, 3 meals a day      Safety   Seat belts: Yes    Guns: No  Safe in relationships: Yes    Family History  Problem Relation Age of Onset   Stroke Father    Diabetes Brother    Lymphoma Brother    Breast cancer Cousin        maternal cousin    OBJCTIVE -PE, EKG, labs   Wt Readings from Last 3 Encounters:  01/10/23 162 lb 12.8 oz (73.8 kg)  12/27/22 164 lb (74.4 kg)  10/29/22 166 lb 4 oz (75.4 kg)    Physical Exam: BP (!) 153/82 (BP Location: Right Arm, Patient Position: Sitting, Cuff Size: Normal)   Pulse (!) 103   Ht '5\' 4"'$  (1.626 m)   Wt 162 lb 12.8 oz (73.8 kg)   SpO2 94%   BMI 27.94 kg/m  Physical Exam Vitals reviewed.  Constitutional:      General: She is not in acute distress.    Appearance: Normal appearance. She is normal weight. She is not ill-appearing (Well-nourished, well-groomed.  Healthy-appearing.) or toxic-appearing.  HENT:     Head: Normocephalic and atraumatic.  Neck:     Vascular: No carotid bruit or JVD.  Cardiovascular:     Rate and Rhythm: Regular rhythm. Tachycardia present.     Chest Wall: PMI is not displaced.     Pulses: Normal pulses.     Heart sounds: S1 normal and S2 normal. Heart sounds are distant. No murmur heard.    No friction rub. No gallop.  Pulmonary:     Effort: Pulmonary effort is normal. No respiratory distress.     Breath sounds: Normal breath sounds. No wheezing, rhonchi or rales.  Chest:     Chest wall: Tenderness (Mild central chest discomfort with full exam, this is not Necessarily what she feels with her chest pain) present.  Abdominal:     General: Abdomen is  flat. Bowel sounds are normal. There is no distension.     Palpations: Abdomen is soft. There is no mass.     Tenderness: There is no abdominal tenderness. There is no guarding or rebound.     Hernia: No hernia is present.     Comments: No HSM or bruit  Musculoskeletal:        General: No swelling. Normal range of motion.     Cervical back: Normal range of motion and neck supple.  Skin:    General: Skin is warm and dry.  Neurological:     General: No focal deficit present.     Mental Status: She is alert and oriented to person, place, and time.  Psychiatric:        Mood and Affect: Mood normal.        Behavior: Behavior normal.        Thought Content: Thought content normal.        Judgment: Judgment normal.     Adult ECG Report  Rate: 103;  Rhythm: sinus tachycardia and low voltage.  Cannot rule out inferior MI, age-indeterminate or a tear MI, age-indeterminate. ;   Narrative Interpretation: Borderline.  Stable.  Recent Labs: Reviewed. Lab Results  Component Value Date   CHOL 148 01/07/2023   HDL 43.50 01/07/2023   LDLCALC 81 01/07/2023   LDLDIRECT 84.0 06/06/2021   TRIG 116.0 01/07/2023   CHOLHDL 3 01/07/2023   Lab Results  Component Value Date   CREATININE 0.91 01/10/2023   BUN 14 01/10/2023   NA 140 01/10/2023   K 4.1 01/10/2023   CL 105 01/10/2023   CO2 28 01/10/2023  Latest Ref Rng & Units 06/06/2021   11:53 AM 04/15/2019    1:38 PM 06/19/2018   10:30 AM  CBC  WBC 4.0 - 10.5 K/uL 4.0  4.5  3.5   Hemoglobin 12.0 - 15.0 g/dL 14.1  14.6  14.4   Hematocrit 36.0 - 46.0 % 41.6  44.4  43.1   Platelets 150.0 - 400.0 K/uL 204.0  206  240     Lab Results  Component Value Date   HGBA1C 5.5 10/03/2016   Lab Results  Component Value Date   TSH 13.68 (H) 01/07/2023    ================================================== I spent a total of 19 minutes with the patient spent in direct patient consultation.  Additional time spent with chart review  / charting  (studies, outside notes, etc): 25 min Total Time: 42 min  Current medicines are reviewed at length with the patient today.  (+/- concerns) N/A  Notice: This dictation was prepared with Dragon dictation along with smart phrase technology. Any transcriptional errors that result from this process are unintentional and may not be corrected upon review.   Studies Ordered:  Orders Placed This Encounter  Procedures   CT CORONARY MORPH W/CTA COR W/SCORE W/CA W/CM &/OR WO/CM   Basic metabolic panel   EKG 34-LPFX   Meds ordered this encounter  Medications   metoprolol tartrate (LOPRESSOR) 100 MG tablet    Sig: Take 1 tablet (100 mg total) by mouth once for 1 dose. Take 2 hours prior to your testing.    Dispense:  1 tablet    Refill:  0    Patient Instructions / Medication Changes & Studies & Tests Ordered   Patient Instructions  Medication Instructions:  No changes at this time.   *If you need a refill on your cardiac medications before your next appointment, please call your pharmacy*   Lab Work: [facility] today over at the [facility]. Stop at registration desk to check in.   If you have labs (blood work) drawn today and your tests are completely normal, you will receive your results only by: [place] (if you have MyChart) OR A paper copy in the mail If you have any lab test that is abnormal or we need to change your treatment, we will call you to review the results.   Testing/Procedures:   Your cardiac CT is scheduled at the below location:    [facility] [place], [place] [address] [phone]  Scheduled for 01/24/22 at 3:30 pm. Please arrive at 3:15 pm for registration and check in.   If scheduled at [facility] or [facility], please arrive 15 mins early for check-in and test prep.   Please follow these instructions carefully (unless otherwise  directed):  Hold all erectile dysfunction medications at least 3 days (72 hrs) prior to test. (Ie viagra, cialis, sildenafil, tadalafil, etc) We will administer nitroglycerin during this exam.   On the Night Before the Test: Be sure to Drink plenty of water. Do not consume any caffeinated/decaffeinated beverages or chocolate 12 hours prior to your test. Do not take any antihistamines 12 hours prior to your test.   On the Day of the Test: Drink plenty of water until 1 hour prior to the test. Do not eat any food 1 hour prior to test. You may take your regular medications prior to the test.  Take metoprolol (Lopressor) 100 mg two hours prior to test. HOLD Furosemide/Hydrochlorothiazide morning of the  test. FEMALES- please wear underwire-free bra if available, avoid dresses & tight clothing       After the Test: Drink plenty of water. After receiving IV contrast, you may experience a mild flushed feeling. This is normal. On occasion, you may experience a mild rash up to 24 hours after the test. This is not dangerous. If this occurs, you can take Benadryl 25 mg and increase your fluid intake. If you experience trouble breathing, this can be serious. If it is severe call 911 IMMEDIATELY. If it is mild, please call our office. If you take any of these medications: Glipizide/Metformin, Avandament, Glucavance, please do not take 48 hours after completing test unless otherwise instructed.  We will call to schedule your test 2-4 weeks out understanding that some insurance companies will need an authorization prior to the service being performed.   For non-scheduling related questions, please contact the cardiac imaging nurse navigator should you have any questions/concerns: Marchia Bond, Cardiac Imaging Nurse Navigator Gordy Clement, Cardiac Imaging Nurse Navigator [facility] Direct Office Dial: [phone]   For scheduling needs, including cancellations and  rescheduling, please call [place], [phone].    Follow-Up: At [facility], you and your health needs are our priority.  As part of our continuing mission to provide you with exceptional heart care, we have created designated Provider Care Teams.  These Care Teams include your primary Cardiologist (physician) and Advanced Practice Providers (APPs -  Physician Assistants and Nurse Practitioners) who all work together to provide you with the care you need, when you need it.   Your next appointment:   Follow up after testing has been done.   Provider:   You may see Dr. Glenetta Hew or one of the following Advanced Practice Providers on your designated Care Team:   Murray Hodgkins, NP Christell Faith, PA-C Cadence Kathlen Mody, PA-C Gerrie Nordmann, NP        Leonie Man, MD, MS Glenetta Hew, M.D., M.S. Interventional Cardiologist  [facility]   [address] [place], [place]  [address] [phone]           Fax [phone]    Thank you for choosing [facility] in [place]!!

## 2023-01-11 ENCOUNTER — Encounter: Payer: Self-pay | Admitting: Nurse Practitioner

## 2023-01-11 ENCOUNTER — Telehealth (INDEPENDENT_AMBULATORY_CARE_PROVIDER_SITE_OTHER): Payer: Medicare Other | Admitting: Nurse Practitioner

## 2023-01-11 ENCOUNTER — Telehealth: Payer: Self-pay | Admitting: Family Medicine

## 2023-01-11 DIAGNOSIS — U071 COVID-19: Secondary | ICD-10-CM

## 2023-01-11 DIAGNOSIS — K589 Irritable bowel syndrome without diarrhea: Secondary | ICD-10-CM | POA: Insufficient documentation

## 2023-01-11 MED ORDER — NIRMATRELVIR/RITONAVIR (PAXLOVID)TABLET: 3.0000 | ORAL_TABLET | Freq: Two times a day (BID) | ORAL | 0 refills | Status: AC

## 2023-01-11 NOTE — Progress Notes (Signed)
Patient ID: Kristina Carlson, female    DOB: [birth year], 87 y.o.   MRN: [id number]  Virtual visit completed through Christine, a video enabled telemedicine application. Due to national recommendations of social distancing due to COVID-19, a virtual visit is felt to be most appropriate for this patient at this time. Reviewed limitations, risks, security and privacy concerns of performing a virtual visit and the availability of in person appointments. I also reviewed that there may be a patient responsible charge related to this service. The patient agreed to proceed.   Patient location: home Provider location:  at [facility], office Persons participating in this virtual visit: patient, provider, grand daughter  If any vitals were documented, they were collected by patient at home unless specified below.    There were no vitals taken for this visit.   CC: Covid 19 Subjective:   HPI: ABBIGAIL ANSTEY is a 87 y.o. female presenting on 01/11/2023 for Covid Positive (Nasal congestion, cough, fatigue since yesterday; denies fever; did have one episode of emesis)    Symptoms started on 01/10/2023 Tested positive today  Covid vaccines: pfizer Flu vaccine: UTD No sick contacts States no over the counter minus the ibuprofen    Relevant past medical, surgical, family and social history reviewed and updated as indicated. Interim medical history since our last visit reviewed. Allergies and medications reviewed and updated. Outpatient Medications Prior to Visit  Medication Sig Dispense Refill   aspirin 81 MG EC tablet TAKE 1 TABLET (81 MG TOTAL) BY MOUTH DAILY. SWALLOW WHOLE. 90 tablet 3   atorvastatin (LIPITOR) 40 MG tablet Take 1 tablet (40 mg total) by mouth daily. 90 tablet 1   Calcium Carb-Cholecalciferol (CALCIUM 600+D3) 600-200 MG-UNIT TABS Take 1 tablet by mouth daily.      [employer] VITAMIN B12 1000 MCG tablet TAKE 1 TABLET BY MOUTH EVERY DAY 90 tablet 0   famotidine (PEPCID) 20 MG  tablet TAKE 1 TABLET BY MOUTH TWICE A DAY 60 tablet 0   FIBER PO Take by mouth daily. Takes gummies     levothyroxine (SYNTHROID) 75 MCG tablet Take 0.5 tablets (37.5 mcg total) by mouth daily. 45 tablet 3   losartan (COZAAR) 50 MG tablet TAKE 1 TABLET BY MOUTH EVERY DAY 90 tablet 3   Multiple Vitamins-Minerals (OCUVITE PRESERVISION) TABS Take 1 tablet by mouth 2 (two) times a day.     ondansetron (ZOFRAN) 4 MG tablet Take 4 mg by mouth every 8 (eight) hours as needed.     pantoprazole (PROTONIX) 40 MG tablet Take 1 tablet (40 mg total) by mouth daily. 30 tablet 3   tobramycin (TOBREX) 0.3 % ophthalmic solution SMARTSIG:In Eye(s)     metoprolol tartrate (LOPRESSOR) 100 MG tablet Take 1 tablet (100 mg total) by mouth once for 1 dose. Take 2 hours prior to your testing. 1 tablet 0   No facility-administered medications prior to visit.     Per HPI unless specifically indicated in ROS section below Review of Systems  Constitutional:  Positive for chills and fatigue. Negative for appetite change and fever.       Drinking plenty fluid   HENT:  Negative for ear discharge, ear pain and sore throat.   Respiratory:  Positive for cough. Negative for shortness of breath.   Gastrointestinal:  Positive for vomiting. Negative for abdominal pain and diarrhea.  Musculoskeletal:  Positive for arthralgias and joint swelling.  Neurological:  Positive for headaches.   Objective:  There were no vitals taken  for this visit.  Wt Readings from Last 3 Encounters:  01/10/23 162 lb 12.8 oz (73.8 kg)  12/27/22 164 lb (74.4 kg)  10/29/22 166 lb 4 oz (75.4 kg)       Physical exam: Gen: alert, NAD, not ill appearing Pulm: speaks in complete sentences without increased work of breathing Psych: normal mood, normal thought content      Results for orders placed or performed during the hospital encounter of 81/10/31  Basic metabolic panel  Result Value Ref Range   Sodium 140 135 - 145 mmol/L   Potassium 4.1  3.5 - 5.1 mmol/L   Chloride 105 98 - 111 mmol/L   CO2 28 22 - 32 mmol/L   Glucose, Bld 100 (H) 70 - 99 mg/dL   BUN 14 8 - 23 mg/dL   Creatinine, Ser 0.91 0.44 - 1.00 mg/dL   Calcium 9.1 8.9 - 10.3 mg/dL   GFR, Estimated >60 >60 mL/min   Anion gap 7 5 - 15   Assessment & Plan:   COVID-19 Assessment & Plan: Patient tested positive for COVID at home.  Did discuss antiviral treatments for the better EUA only.  After discussion elected to treat with Paxlovid.  Most recent renal function was yesterday.  Patient will hold her atorvastatin for 2 weeks.  Did discuss CDC guidelines/recommendations in regards to self-isolation/quarantine.  Did discuss signs and symptoms to seek urgent or emergent healthcare.  Also discussed common side effects of Paxlovid use.  Did discuss the above with patient's granddaughter after speaking with patient.  Orders: -     nirmatrelvir/ritonavir; Take 3 tablets by mouth 2 (two) times daily for 5 days. (Take nirmatrelvir 150 mg two tablets twice daily for 5 days and ritonavir 100 mg one tablet twice daily for 5 days) Patient GFR is <60. DO NOT take atorvastatin for 2 weeks  Dispense: 30 tablet; Refill: 0     I discussed the assessment and treatment plan with the patient. The patient was provided an opportunity to ask questions and all were answered. The patient agreed with the plan and demonstrated an understanding of the instructions. The patient was advised to call back or seek an in-person evaluation if the symptoms worsen or if the condition fails to improve as anticipated.  Follow up plan: Return if symptoms worsen or fail to improve.  Romilda Garret, NP

## 2023-01-11 NOTE — Patient Instructions (Addendum)
Quarantine through 01/15/2023. If fever free for 24 hours without the use of fever reducing medications she can go out but need to wear a mask through 01/20/2023

## 2023-01-11 NOTE — Assessment & Plan Note (Signed)
Patient tested positive for COVID at home.  Did discuss antiviral treatments for the better EUA only.  After discussion elected to treat with Paxlovid.  Most recent renal function was yesterday.  Patient will hold her atorvastatin for 2 weeks.  Did discuss CDC guidelines/recommendations in regards to self-isolation/quarantine.  Did discuss signs and symptoms to seek urgent or emergent healthcare.  Also discussed common side effects of Paxlovid use.  Did discuss the above with patient's granddaughter after speaking with patient.

## 2023-01-11 NOTE — Telephone Encounter (Signed)
error 

## 2023-01-12 ENCOUNTER — Encounter: Payer: Self-pay | Admitting: Cardiology

## 2023-01-12 NOTE — Assessment & Plan Note (Signed)
She did have moderate disease on her cardiac catheterization back in 2017.  Now having recurrent episodes of chest pain that do sound musculoskeletal or GERD related, but for reassurance purposes of things not unreasonable to reassess her coronary arteries.  Concern for possible false negatives or false positives with stress test, therefore I think Coronary CTA is the best option to assess anatomy and physiology.  Definitive evaluation with Coronary CTA will allow [place] to provide reassurance if negative and potentially pursue invasive options if positive.  She is on atorvastatin, aspirin and ARB, would consider beta-blocker since she is tachycardic.  Will wait to see the results.  Pending findings, may want to be a little more aggressive with cholesterol level showing LDL of 81.  However for now until reassessed, can stay with current therapy.Marland Kitchen

## 2023-01-12 NOTE — Assessment & Plan Note (Signed)
Precordial chest pain in a patient who does have moderate nonobstructive disease by catheterization 8 years ago.  Unusual that her symptoms are at rest and not necessarily exertional.  She says that she is actually having some of the discomfort while she was talking to me. I think is reasonable to assess for coronary ischemia-I think the best option would be Coronary CTA which gives [place] both anatomy and physiology (if FFR ct ordered).  Plan: Will check Coronary CTA which requires chemistry panel and evaluation.  This will help guide further therapy. Would probably consider beta-blocker given her elevated blood pressure and tachycardia.  She is only on 50 mg losartan. Agree with aspirin and statin.

## 2023-01-12 NOTE — Assessment & Plan Note (Signed)
Blood pressure is elevated today which typically for an 87 year old is not overly concerning at current levels.  However we do have room to start a beta-blocker if indicated based on Coronary CTA results.  Continue losartan for now, but low threshold to consider beta-blocker

## 2023-01-23 ENCOUNTER — Telehealth (HOSPITAL_COMMUNITY): Payer: Self-pay | Admitting: *Deleted

## 2023-01-23 NOTE — Telephone Encounter (Signed)
Reaching out to patient to offer assistance regarding upcoming cardiac imaging study; pt verbalizes understanding of appt date/time, parking situation and where to check in, pre-test NPO status and medications ordered, and verified current allergies; name and call back number provided for further questions should they arise ? ?Jasean Ambrosia RN Navigator Cardiac Imaging ?[facility] ?[phone] office ?[phone] cell ? ?Patient to take 100mg metoprolol tartrate two hours prior to her cardiac CT scan.  ?

## 2023-01-24 ENCOUNTER — Encounter: Payer: Self-pay | Admitting: Dermatology

## 2023-01-24 ENCOUNTER — Ambulatory Visit
Admission: RE | Admit: 2023-01-24 | Discharge: 2023-01-24 | Disposition: A | Discharge status: Home / Self Care | Payer: Medicare Other | Source: Ambulatory Visit | Attending: Cardiology | Admitting: Cardiology

## 2023-01-24 ENCOUNTER — Ambulatory Visit: Payer: Medicare Other | Admitting: Dermatology

## 2023-01-24 VITALS — BP 161/81 | HR 85

## 2023-01-24 DIAGNOSIS — I2510 Atherosclerotic heart disease of native coronary artery without angina pectoris: Secondary | ICD-10-CM

## 2023-01-24 DIAGNOSIS — L821 Other seborrheic keratosis: Secondary | ICD-10-CM | POA: Diagnosis not present

## 2023-01-24 DIAGNOSIS — D229 Melanocytic nevi, unspecified: Secondary | ICD-10-CM

## 2023-01-24 DIAGNOSIS — L570 Actinic keratosis: Secondary | ICD-10-CM | POA: Diagnosis not present

## 2023-01-24 DIAGNOSIS — L578 Other skin changes due to chronic exposure to nonionizing radiation: Secondary | ICD-10-CM | POA: Diagnosis not present

## 2023-01-24 DIAGNOSIS — R072 Precordial pain: Secondary | ICD-10-CM | POA: Insufficient documentation

## 2023-01-24 DIAGNOSIS — R931 Abnormal findings on diagnostic imaging of heart and coronary circulation: Secondary | ICD-10-CM

## 2023-01-24 DIAGNOSIS — D225 Melanocytic nevi of trunk: Secondary | ICD-10-CM

## 2023-01-24 DIAGNOSIS — L814 Other melanin hyperpigmentation: Secondary | ICD-10-CM

## 2023-01-24 DIAGNOSIS — D492 Neoplasm of unspecified behavior of bone, soft tissue, and skin: Secondary | ICD-10-CM

## 2023-01-24 DIAGNOSIS — D239 Other benign neoplasm of skin, unspecified: Secondary | ICD-10-CM

## 2023-01-24 DIAGNOSIS — Z1283 Encounter for screening for malignant neoplasm of skin: Secondary | ICD-10-CM | POA: Diagnosis not present

## 2023-01-24 HISTORY — DX: Other benign neoplasm of skin, unspecified: D23.9

## 2023-01-24 MED ORDER — METOPROLOL TARTRATE 5 MG/5ML IV SOLN
10.0000 mg | Freq: Once | INTRAVENOUS | Status: AC
  Administered 2023-01-24: 10 mg via INTRAVENOUS

## 2023-01-24 MED ORDER — IOHEXOL 350 MG/ML SOLN
75.0000 mL | Freq: Once | INTRAVENOUS | Status: AC | PRN
  Administered 2023-01-24: 75 mL via INTRAVENOUS

## 2023-01-24 MED ORDER — NITROGLYCERIN 0.4 MG SL SUBL
0.8000 mg | SUBLINGUAL_TABLET | Freq: Once | SUBLINGUAL | Status: AC
  Administered 2023-01-24: 0.8 mg via SUBLINGUAL

## 2023-01-24 NOTE — Patient Instructions (Addendum)
Cryotherapy Aftercare  Wash gently with soap and water everyday.   Apply Vaseline daily until healed.    Wound Care Instructions  Cleanse wound gently with soap and water once a day then pat dry with clean gauze. Apply a thin coat of Petrolatum (petroleum jelly, "Vaseline") over the wound (unless you have an allergy to this). We recommend that you use a new, sterile tube of Vaseline. Do not pick or remove scabs. Do not remove the yellow or white "healing tissue" from the base of the wound.  Cover the wound with fresh, clean, nonstick gauze and secure with paper tape. You may use Band-Aids in place of gauze and tape if the wound is small enough, but would recommend trimming much of the tape off as there is often too much. Sometimes Band-Aids can irritate the skin.  You should call the office for your biopsy report after 1 week if you have not already been contacted.  If you experience any problems, such as abnormal amounts of bleeding, swelling, significant bruising, significant pain, or evidence of infection, please call the office immediately.  FOR ADULT SURGERY PATIENTS: If you need something for pain relief you may take 1 extra strength Tylenol (acetaminophen) AND 2 Ibuprofen ('200mg'$  each) together every 4 hours as needed for pain. (do not take these if you are allergic to them or if you have a reason you should not take them.) Typically, you may only need pain medication for 1 to 3 days.       Recommend taking Heliocare sun protection supplement daily in sunny weather for additional sun protection. For maximum protection on the sunniest days, you can take up to 2 capsules of regular Heliocare OR take 1 capsule of Heliocare Ultra. For prolonged exposure (such as a full day in the sun), you can repeat your dose of the supplement 4 hours after your first dose. Heliocare can be purchased at [employer], at some [facility] or at [email or web address].     Recommend daily broad spectrum  sunscreen SPF 30+ to sun-exposed areas, reapply every 2 hours as needed. Call for new or changing lesions.  Staying in the shade or wearing long sleeves, sun glasses (UVA+UVB protection) and wide brim hats (4-inch brim around the entire circumference of the hat) are also recommended for sun protection.    Melanoma ABCDEs  Melanoma is the most dangerous type of skin cancer, and is the leading cause of death from skin disease.  You are more likely to develop melanoma if you: Have light-colored skin, light-colored eyes, or red or blond hair Spend a lot of time in the sun Tan regularly, either outdoors or in a tanning bed Have had blistering sunburns, especially during childhood Have a close family member who has had a melanoma Have atypical moles or large birthmarks  Early detection of melanoma is key since treatment is typically straightforward and cure rates are extremely high if we catch it early.   The first sign of melanoma is often a change in a mole or a new dark spot.  The ABCDE system is a way of remembering the signs of melanoma.  A for asymmetry:  The two halves do not match. B for border:  The edges of the growth are irregular. C for color:  A mixture of colors are present instead of an even brown color. D for diameter:  Melanomas are usually (but not always) greater than 65m - the size of a pencil eraser. E for evolution:  The spot  keeps changing in size, shape, and color.  Please check your skin once per month between visits. You can use a small mirror in front and a large mirror behind you to keep an eye on the back side or your body.   If you see any new or changing lesions before your next follow-up, please call to schedule a visit.  Please continue daily skin protection including broad spectrum sunscreen SPF 30+ to sun-exposed areas, reapplying every 2 hours as needed when you're outdoors.   Staying in the shade or wearing long sleeves, sun glasses (UVA+UVB protection)  and wide brim hats (4-inch brim around the entire circumference of the hat) are also recommended for sun protection.    Due to recent changes in healthcare laws, you may see results of your pathology and/or laboratory studies on MyChart before the doctors have had a chance to review them. We understand that in some cases there may be results that are confusing or concerning to you. Please understand that not all results are received at the same time and often the doctors may need to interpret multiple results in order to provide you with the best plan of care or course of treatment. Therefore, we ask that you please give [place] 2 business days to thoroughly review all your results before contacting the office for clarification. Should we see a critical lab result, you will be contacted sooner.   If You Need Anything After Your Visit  If you have any questions or concerns for your doctor, please call our main line at [phone] and press option 4 to reach your doctor's medical assistant. If no one answers, please leave a voicemail as directed and we will return your call as soon as possible. Messages left after 4 pm will be answered the following business day.   You may also send [place] a message via [place]. We typically respond to MyChart messages within 1-2 business days.  For prescription refills, please ask your pharmacy to contact our office. Our fax number is [phone].  If you have an urgent issue when the clinic is closed that cannot wait until the next business day, you can page your doctor at the number below.    Please note that while we do our best to be available for urgent issues outside of office hours, we are not available 24/7.   If you have an urgent issue and are unable to reach [place], you may choose to seek medical care at your doctor's office, retail clinic, urgent care center, or emergency room.  If you have a medical emergency, please immediately call 911 or go to the emergency  department.  Pager Numbers  - Dr. Nehemiah Massed: [phone]  - Dr. Laurence Ferrari: [phone]  - Dr. Nicole Kindred: [phone]  In the event of inclement weather, please call our main line at [phone] for an update on the status of any delays or closures.  Dermatology Medication Tips: Please keep the boxes that topical medications come in in order to help keep track of the instructions about where and how to use these. Pharmacies typically print the medication instructions only on the boxes and not directly on the medication tubes.   If your medication is too expensive, please contact our office at [phone] option 4 or send [place] a message through [place].   We are unable to tell what your co-pay for medications will be in advance as this is different depending on your insurance coverage. However, we may be able to find a substitute  medication at lower cost or fill out paperwork to get insurance to cover a needed medication.   If a prior authorization is required to get your medication covered by your insurance company, please allow [place] 1-2 business days to complete this process.  Drug prices often vary depending on where the prescription is filled and some pharmacies may offer cheaper prices.  The website www.goodrx.com contains coupons for medications through different pharmacies. The prices here do not account for what the cost may be with help from insurance (it may be cheaper with your insurance), but the website can give you the price if you did not use any insurance.  - You can print the associated coupon and take it with your prescription to the pharmacy.  - You may also stop by our office during regular business hours and pick up a GoodRx coupon card.  - If you need your prescription sent electronically to a different pharmacy, notify our office through [facility] or by phone at [phone] option 4.     Si Usted Necesita Algo Despus de Su Visita  Tambin puede enviarnos un  mensaje a travs de Pharmacist, community. Por lo general respondemos a los mensajes de MyChart en el transcurso de 1 a 2 das hbiles.  Para renovar recetas, por favor pida a su farmacia que se ponga en contacto con nuestra oficina. Harland Dingwall de fax es [place] [phone].  Si tiene un asunto urgente cuando la clnica est cerrada y que no puede esperar hasta el siguiente da hbil, puede llamar/localizar a su doctor(a) al nmero que aparece a continuacin.   Por favor, tenga en cuenta que aunque hacemos todo lo posible para estar disponibles para asuntos urgentes fuera del horario de [place], no estamos disponibles las 24 horas del da, los 7 das de la [place].   Si tiene un problema urgente y no puede comunicarse con nosotros, puede optar por buscar atencin mdica  en el consultorio de su doctor(a), en una clnica privada, en un centro de atencin urgente o en una sala de emergencias.  Si tiene Engineering geologist, por favor llame inmediatamente al 911 o vaya a la sala de emergencias.  Nmeros de bper  - Dr. Nehemiah Massed: [phone]  - Dra. Moye: [phone]  - Dra. Nicole Kindred: [phone]  En caso de inclemencias del [place], por favor llame a Johnsie Kindred principal al [phone] para una actualizacin sobre el [place] de cualquier retraso o cierre.  Consejos para la medicacin en dermatologa: Por favor, guarde las cajas en las que vienen los medicamentos de uso tpico para ayudarle a seguir las instrucciones sobre dnde y cmo usarlos. Las farmacias generalmente imprimen las instrucciones del medicamento slo en las cajas y no directamente en los tubos del [place].   Si su medicamento es muy caro, por favor, pngase en contacto con Zigmund Daniel llamando al [phone] y presione la opcin 4 o envenos un mensaje a travs de Pharmacist, community.   No podemos decirle cul ser su copago por los medicamentos por adelantado ya que esto es diferente dependiendo de la cobertura de su seguro. Sin embargo,  es posible que podamos encontrar un medicamento sustituto a Electrical engineer un formulario para que el seguro cubra el medicamento que se considera necesario.   Si se requiere una autorizacin previa para que su compaa de seguros [place] su medicamento, por favor permtanos de 1 a 2 das hbiles para completar este proceso.  Los precios de los medicamentos varan con frecuencia dependiendo del Environmental consultant de  lugar de dnde se surte la receta y alguna farmacias pueden ofrecer precios ms baratos.  El sitio web www.goodrx.com tiene cupones para medicamentos de diferentes farmacias. Los precios aqu no tienen en cuenta lo que podra costar con la ayuda del seguro (puede ser ms barato con su seguro), pero el sitio web puede darle el precio si no utiliz ningn seguro.  - Puede imprimir el cupn correspondiente y llevarlo con su receta a la farmacia.  - Tambin puede pasar por nuestra oficina durante el horario de atencin regular y recoger una tarjeta de cupones de GoodRx.  - Si necesita que su receta se enve electrnicamente a una farmacia diferente, informe a nuestra oficina a travs de MyChart de [place] o por telfono llamando al [phone] y presione la opcin 4.  

## 2023-01-24 NOTE — Progress Notes (Signed)
Follow-Up Visit   Subjective  Kristina Carlson is a 87 y.o. female who presents for the following: Annual Exam (No personal Hx of skin cancer or dysplastic nevi. Hx of AKs  ).  The patient presents for Total-Body Skin Exam (TBSE) for skin cancer screening and mole check.  The patient has spots, moles and lesions to be evaluated, some may be new or changing and the patient has concerns that these could be cancer.  The following portions of the chart were reviewed this encounter and updated as appropriate:  Tobacco  Allergies  Meds  Problems  Med Hx  Surg Hx  Fam Hx      Review of Systems: No other skin or systemic complaints except as noted in HPI or Assessment and Plan.   Objective  Well appearing patient in no apparent distress; mood and affect are within normal limits.  A full examination was performed including scalp, head, eyes, ears, nose, lips, neck, chest, axillae, abdomen, back, buttocks, bilateral upper extremities, bilateral lower extremities, hands, feet, fingers, toes, fingernails, and toenails. All findings within normal limits unless otherwise noted below.  left medial thigh (hypertrophic) x1, right temple x1 (2) Erythematous thin papules/macules with gritty scale.   Right Chest 0.8 cm brown and pink papule        Assessment & Plan   Lentigines - Scattered tan macules - Due to sun exposure - Benign-appearing, observe - Recommend daily broad spectrum sunscreen SPF 30+ to sun-exposed areas, reapply every 2 hours as needed. - Call for any changes  Seborrheic Keratoses - Stuck-on, waxy, tan-brown papules and/or plaques  - Benign-appearing - Discussed benign etiology and prognosis. - Observe - Call for any changes  Melanocytic Nevi - Tan-brown and/or pink-flesh-colored symmetric macules and papules - Benign appearing on exam today - Observation - Call clinic for new or changing moles - Recommend daily use of broad spectrum spf 30+ sunscreen to  sun-exposed areas.   Hemangiomas - Red papules - Discussed benign nature - Observe - Call for any changes  Actinic Damage - Chronic condition, secondary to cumulative UV/sun exposure - diffuse scaly erythematous macules with underlying dyspigmentation - Recommend daily broad spectrum sunscreen SPF 30+ to sun-exposed areas, reapply every 2 hours as needed.  - Staying in the shade or wearing long sleeves, sun glasses (UVA+UVB protection) and wide brim hats (4-inch brim around the entire circumference of the hat) are also recommended for sun protection.  - Call for new or changing lesions.  Skin cancer screening performed today.  AK (actinic keratosis) (2) left medial thigh (hypertrophic) x1, right temple x1  Actinic keratoses are precancerous spots that appear secondary to cumulative UV radiation exposure/sun exposure over time. They are chronic with expected duration over 1 year. A portion of actinic keratoses will progress to squamous cell carcinoma of the skin. It is not possible to reliably predict which spots will progress to skin cancer and so treatment is recommended to prevent development of skin cancer.  Recommend daily broad spectrum sunscreen SPF 30+ to sun-exposed areas, reapply every 2 hours as needed.  Recommend staying in the shade or wearing long sleeves, sun glasses (UVA+UVB protection) and wide brim hats (4-inch brim around the entire circumference of the hat). Call for new or changing lesions.  Call if lesion on right temple has not resolved in 6-8 weeks.  Prior to procedure, discussed risks of blister formation, small wound, skin dyspigmentation, or rare scar following cryotherapy. Recommend Vaseline ointment to treated areas while healing.  Destruction of lesion - left medial thigh (hypertrophic) x1, right temple x1  Destruction method: cryotherapy   Informed consent: discussed and consent obtained   Lesion destroyed using liquid nitrogen: Yes   Region frozen  until ice ball extended beyond lesion: Yes   Outcome: patient tolerated procedure well with no complications   Post-procedure details: wound care instructions given   Additional details:  Prior to procedure, discussed risks of blister formation, small wound, skin dyspigmentation, or rare scar following cryotherapy. Recommend Vaseline ointment to treated areas while healing.   Neoplasm of skin Right Chest  Epidermal / dermal shaving  Lesion diameter (cm):  0.8 Informed consent: discussed and consent obtained   Patient was prepped and draped in usual sterile fashion: Area prepped with alcohol. Anesthesia: the lesion was anesthetized in a standard fashion   Anesthetic:  1% lidocaine w/ epinephrine 1-100,000 buffered w/ 8.4% NaHCO3 Instrument used: flexible razor blade   Hemostasis achieved with: pressure, aluminum chloride and electrodesiccation   Outcome: patient tolerated procedure well   Post-procedure details: wound care instructions given   Post-procedure details comment:  Ointment and small bandage applied  Specimen 1 - Surgical pathology Differential Diagnosis: R/O atypia vs BCC  Check Margins: No   Return in about 1 year (around 01/25/2024) for TBSE.  I, Emelia Salisbury, CMA, am acting as scribe for Forest Gleason, MD.  Documentation: I have reviewed the above documentation for accuracy and completeness, and I agree with the above.  Forest Gleason, MD

## 2023-01-24 NOTE — Progress Notes (Signed)
Patient tolerated procedure well. Ambulate w/o difficulty. Denies light headedness or being dizzy. Sitting in chair drinking water provided. Encouraged to drink extra water today and reasoning explained. Verbalized understanding. All questions answered. ABC intact. No further needs. Discharge from procedure area w/o issues.   °

## 2023-01-25 ENCOUNTER — Other Ambulatory Visit: Payer: Self-pay | Admitting: Cardiology

## 2023-01-25 DIAGNOSIS — R931 Abnormal findings on diagnostic imaging of heart and coronary circulation: Secondary | ICD-10-CM

## 2023-01-25 HISTORY — PX: OTHER SURGICAL HISTORY: SHX169

## 2023-01-28 ENCOUNTER — Telehealth: Payer: Self-pay | Admitting: Cardiology

## 2023-01-28 NOTE — Telephone Encounter (Signed)
Kristina Man, MD 01/26/2023  2:27 PM EST     Coronary artery CT Angiogram: Not unexpectedly, there is evidence of calcification of the aorta. Coronary calcium score is 387.   The coronary arteries, the right coronary artery appears to have an area of plaque that is somewhat calcified and appears to be at least moderate to severe if not severe.  This is good to be evaluated with the CT FFR software to determine if it is a significant enough lesion (narrowing) to cause symptoms.   You should have a f/u appt scheduled to discuss results -- we can fo over options on treatment at that time.   Glenetta Hew, MD    Kristina Man, MD 01/26/2023  2:30 PM EST     The CT FFR results do suggest that the blockage in the right coronary artery is significant enough to potentially cause symptoms.  We need to discuss options of whether we do medical management versus consider heart catheterization with possible stent placement.   The area of the blockage is very close to the aorta which makes it somewhat challenging angioplasty and stent procedure.  However if symptoms are significant despite medical therapy, then it is reasonable to consider.  Can discuss in follow-up.   Glenetta Hew, MD

## 2023-01-28 NOTE — Telephone Encounter (Signed)
I spoke with the patient regarding her Cardiac CT results and Dr. Allison Quarry impression/ recommendations.  The patient advised that she all her symptoms are stable. I have advised that if symptoms are stable, we will keep her follow up appointment as scheduled on 02/14/23 with Dr. Ellyn Hack. She is aware to contact the office sooner should symptoms worsen or if she has any concerns.   The patient voices understanding and is agreeable.

## 2023-02-04 ENCOUNTER — Encounter: Payer: Self-pay | Admitting: Obstetrics & Gynecology

## 2023-02-04 ENCOUNTER — Ambulatory Visit: Payer: Medicare Other | Admitting: Obstetrics & Gynecology

## 2023-02-04 VITALS — BP 162/84 | HR 76

## 2023-02-04 DIAGNOSIS — N8110 Cystocele, unspecified: Secondary | ICD-10-CM

## 2023-02-04 DIAGNOSIS — Z4689 Encounter for fitting and adjustment of other specified devices: Secondary | ICD-10-CM

## 2023-02-04 NOTE — Progress Notes (Addendum)
   GYNECOLOGY OFFICE VISIT NOTE  History:   Kristina Carlson is a 87 y.o. [phone] female with stage II pelvic organ prolapse who presents for pessary maintenance.  She is followed by Dr. Marilynne (Urogynecology) and has been using a size #4 ring with support pessary. The pessary has been working well, no complaints. She has pessary maintenance here as she has difficulties getting to [place], this [facility] office is closer to her.  She denies any abnormal vaginal discharge, bleeding, pelvic pain or other concerns.    Past Medical History:  Diagnosis Date   Hyperlipidemia    Hypertension    Hypothyroid    Insomnia    Macular degeneration    -- Dr. Rudell Rue (optometrist).  Taking Ocuvite.   Non-occlusive coronary artery disease 06/2014   CARDIAC CATH 06/2014: Mild LAD 50%, OM 1 30%, 50% ostial RCA:    Past Surgical History:  Procedure Laterality Date   LEFT HEART CATHETERIZATION WITH CORONARY ANGIOGRAM N/A 06/29/2014   Procedure: LEFT HEART CATHETERIZATION WITH CORONARY ANGIOGRAM;  Surgeon: Victory LELON Claudene DOUGLAS, MD;  Location: [facility] CATH LAB;  Service: Cardiovascular;: Mild LAD 50%, OM 1 30%, 50% ostial RCA:   The following portions of the patient's history were reviewed and updated as appropriate: allergies, current medications, past family history, past medical history, past social history, past surgical history and problem list.   Review of Systems:  Pertinent items noted in HPI and remainder of comprehensive ROS otherwise negative.  Physical Exam:  BP (!) 162/84   Pulse 76  CONSTITUTIONAL: Well-developed, well-nourished female in no acute distress.  MUSCULOSKELETAL: Normal range of motion. No edema noted. NEUROLOGIC: Alert and oriented to person, place, and time. Normal muscle tone coordination. No cranial nerve deficit noted. PSYCHIATRIC: Normal mood and affect. Normal behavior. Normal judgment and thought content. CARDIOVASCULAR: Normal heart rate noted RESPIRATORY:  Effort and breath sounds normal, no problems with respiration noted ABDOMEN: No masses noted. No other overt distention noted.   PELVIC: Normal external female genitalia with moderate atrophy; Urethral meatus normal in appearance, no urethral masses or discharge. The pessary was noted to be in place. It was removed and cleaned. Exam revealed no lesions in the vagina. The pessary was replaced. It was comfortable to the patient and fit well.  Examination done in presence of chaperone     Assessment and Plan:     1. Stage II Pelvic Organ Prolapse 2. Pessary maintenance - Continue size #4 ring with support pessary - Follow up 3 months for pessary check  Routine preventative health maintenance measures emphasized, follow up with PCP regarding elevated BP and other medical issues. Please refer to After Visit Summary for other counseling recommendations.   Return in about 3 months (around 05/05/2023) for pessary check.    I spent 20 minutes dedicated to the care of this patient including pre-visit review of records, face to face time with the patient discussing her conditions and treatments and post visit orders.   GLORIS HUGGER, MD, FACOG Obstetrician & Gynecologist, [facility] for [employer], [facility]

## 2023-02-05 ENCOUNTER — Encounter: Payer: Self-pay | Admitting: Dermatology

## 2023-02-05 ENCOUNTER — Telehealth: Payer: Self-pay

## 2023-02-05 NOTE — Telephone Encounter (Signed)
-----   Message from Alfonso Patten, MD sent at 02/05/2023  3:19 PM EST ----- Skin , right chest DYSPLASTIC JUNCTIONAL LENTIGINOUS NEVUS WITH SEVERE ATYPIA, DEEP MARGIN INVOLVED, SEE DESCRIPTION --> excision  This is a SEVERELY ATYPICAL MOLE. On the spectrum from normal mole to melanoma skin cancer, this is in between the two but closer towards a melanoma skin cancer.  - The treatment of choice for severely atypical moles is to cut them out in clinic with an area of normal looking skin around them to get all the atypical cells out. The skin that is removed will be sent to check under the microscope again to be sure it looks completely out.   - People who have a history of atypical moles do have a slightly increased risk of developing melanoma somewhere on the body, so a full body skin exam by a dermatologist is recommended at least once a year. - Monthly self skin checks and daily sun protection are also recommended.  - Please also call if you notice any new or changing spots anywhere else on the body before your follow-up visit.    MAs please call with results and schedule.  Please let me know if she has questions for me.  Thank you!

## 2023-02-05 NOTE — Telephone Encounter (Signed)
Discussed pathology results. Patient voiced understanding. Excision scheduled.

## 2023-02-06 ENCOUNTER — Ambulatory Visit (INDEPENDENT_AMBULATORY_CARE_PROVIDER_SITE_OTHER): Payer: Medicare Other | Admitting: Family Medicine

## 2023-02-06 ENCOUNTER — Encounter: Payer: Self-pay | Admitting: Family Medicine

## 2023-02-06 VITALS — BP 140/60 | HR 89 | Temp 97.6°F | Ht 63.5 in | Wt 164.1 lb

## 2023-02-06 DIAGNOSIS — M7062 Trochanteric bursitis, left hip: Secondary | ICD-10-CM

## 2023-02-06 DIAGNOSIS — E038 Other specified hypothyroidism: Secondary | ICD-10-CM

## 2023-02-06 MED ORDER — TRIAMCINOLONE ACETONIDE 40 MG/ML IJ SUSP
40.0000 mg | Freq: Once | INTRAMUSCULAR | Status: AC
  Administered 2023-02-06: 40 mg via INTRA_ARTICULAR

## 2023-02-06 NOTE — Progress Notes (Signed)
Chidera Dearcos T. Dalante Minus, MD, [place] at [facility] [place], [address]  Phone: [phone]  FAX: [phone]  Kristina Carlson - 87 y.o. female  MRN [id number]  Date of Birth: December 26, 1935  Date: 02/06/2023  PCP: Jinny Sanders, MD  Referral: Jinny Sanders, MD  Chief Complaint  Patient presents with   Hip Pain    Left   Subjective:   Kristina Carlson is a 87 y.o. very pleasant female patient with Body mass index is 28.62 kg/m. who presents with the following:  Left trochanteric bursitis.  She has some persistent lateral hip pain, this has been present off and on for multiple months.  Saw her in November 2023 for trochanteric bursitis, and I did do an injection in the left side with some good relief.   Hypothyroidism - check levels.  She has been having trouble with her hypothyroidism, and she would like to have her blood levels checked again. Lab Results  Component Value Date   TSH 13.68 (H) 01/07/2023      Review of Systems is noted in the HPI, as appropriate  Objective:   BP (!) 140/60   Pulse 89   Temp 97.6 F (36.4 C) (Temporal)   Ht 5' 3.5" (1.613 m)   Wt 164 lb 2 oz (74.4 kg)   SpO2 98%   BMI 28.62 kg/m   GEN: No acute distress; alert,appropriate. PULM: Breathing comfortably in no respiratory distress PSYCH: Normally interactive.   Full range of motion in the left hip, and she does have notable tenderness laterally.  She has tenderness at the trochanteric bursa and just caudal to this.  Laboratory and Imaging Data:  Assessment and Plan:     ICD-10-CM   1. Trochanteric bursitis of left hip  M70.62     2. Other specified hypothyroidism  E03.8 TSH    T3, free    T4, free     We will check her thyroid function today.  Aspiration/Injection Procedure Note Kristina Carlson 08/06/1936 Date of procedure: 02/06/2023  Procedure: Large Joint Aspiration / Injection of Hip, Trochanteric Bursa,  L Indications: Pain  Procedure Details Verbal consent obtained. Risks, benefits, and alternatives reviewed. Greater trochanter sterilely prepped with Chloraprep. Ethyl Chloride used for anesthesia. 9 cc of Lidocaine 1% injected with 1 mL of Kenalog 40 mg into trochanteric bursa at area of maximal tenderness at greater trochanter, and it was additionally injected into locations slightly caudal angling downward near the gluteus medius and minimus insertion.. Needle taken to bone to troch bursa, flows easily. Bursa massaged. No bleeding and no complications. Decreased pain after injection. Needle: 22 gauge spinal needle Medication: 1 mL of Kenalog 40 mg   Medication Management during today's office visit: No orders of the defined types were placed in this encounter.  Medications Discontinued During This Encounter  Medication Reason   metoprolol tartrate (LOPRESSOR) 100 MG tablet Completed Course    Orders placed today for conditions managed today: Orders Placed This Encounter  Procedures   TSH   T3, free   T4, free    Disposition: No follow-ups on file.  [employer] One speech-to-text software was used for transcription in this dictation.  Possible transcriptional errors can occur using Editor, commissioning.   Signed,  Maud Deed. Kemisha Bonnette, MD   Outpatient Encounter Medications as of 02/06/2023  Medication Sig   aspirin 81 MG EC tablet TAKE 1 TABLET (81 MG TOTAL) BY MOUTH DAILY.  SWALLOW WHOLE.   atorvastatin (LIPITOR) 40 MG tablet Take 1 tablet (40 mg total) by mouth daily.   Calcium Carb-Cholecalciferol (CALCIUM 600+D3) 600-200 MG-UNIT TABS Take 1 tablet by mouth daily.    [employer] VITAMIN B12 1000 MCG tablet TAKE 1 TABLET BY MOUTH EVERY DAY   famotidine (PEPCID) 20 MG tablet TAKE 1 TABLET BY MOUTH TWICE A DAY   FIBER PO Take by mouth daily. Takes gummies   levothyroxine (SYNTHROID) 75 MCG tablet Take 0.5 tablets (37.5 mcg total) by mouth daily.   losartan (COZAAR) 50 MG tablet TAKE 1  TABLET BY MOUTH EVERY DAY   Multiple Vitamins-Minerals (OCUVITE PRESERVISION) TABS Take 1 tablet by mouth 2 (two) times a day.   ondansetron (ZOFRAN) 4 MG tablet Take 4 mg by mouth every 8 (eight) hours as needed.   pantoprazole (PROTONIX) 40 MG tablet Take 1 tablet (40 mg total) by mouth daily.   tobramycin (TOBREX) 0.3 % ophthalmic solution SMARTSIG:In Eye(s)   [DISCONTINUED] metoprolol tartrate (LOPRESSOR) 100 MG tablet Take 1 tablet (100 mg total) by mouth once for 1 dose. Take 2 hours prior to your testing.   No facility-administered encounter medications on file as of 02/06/2023.

## 2023-02-07 LAB — TSH: TSH: 11.13 u[IU]/mL — ABNORMAL HIGH (ref 0.35–5.50)

## 2023-02-07 LAB — T3, FREE: T3, Free: 2.4 pg/mL (ref 2.3–4.2)

## 2023-02-07 LAB — T4, FREE: Free T4: 0.6 ng/dL (ref 0.60–1.60)

## 2023-02-14 ENCOUNTER — Ambulatory Visit: Payer: Medicare Other | Attending: Cardiology | Admitting: Cardiology

## 2023-02-14 ENCOUNTER — Encounter: Payer: Self-pay | Admitting: Nurse Practitioner

## 2023-02-14 ENCOUNTER — Ambulatory Visit (INDEPENDENT_AMBULATORY_CARE_PROVIDER_SITE_OTHER): Payer: Medicare Other | Admitting: Nurse Practitioner

## 2023-02-14 ENCOUNTER — Ambulatory Visit (INDEPENDENT_AMBULATORY_CARE_PROVIDER_SITE_OTHER)
Admission: RE | Admit: 2023-02-14 | Discharge: 2023-02-14 | Disposition: A | Discharge status: Home / Self Care | Payer: Medicare Other | Source: Ambulatory Visit | Attending: Nurse Practitioner | Admitting: Nurse Practitioner

## 2023-02-14 ENCOUNTER — Encounter: Payer: Self-pay | Admitting: Cardiology

## 2023-02-14 VITALS — BP 150/76 | HR 88 | Ht 63.0 in | Wt 161.5 lb

## 2023-02-14 VITALS — BP 142/76 | HR 83 | Temp 97.9°F | Resp 16 | Ht 63.0 in | Wt 160.1 lb

## 2023-02-14 DIAGNOSIS — W19XXXA Unspecified fall, initial encounter: Secondary | ICD-10-CM | POA: Diagnosis not present

## 2023-02-14 DIAGNOSIS — I10 Essential (primary) hypertension: Secondary | ICD-10-CM

## 2023-02-14 DIAGNOSIS — E785 Hyperlipidemia, unspecified: Secondary | ICD-10-CM | POA: Diagnosis not present

## 2023-02-14 DIAGNOSIS — I25119 Atherosclerotic heart disease of native coronary artery with unspecified angina pectoris: Secondary | ICD-10-CM | POA: Diagnosis not present

## 2023-02-14 DIAGNOSIS — M62838 Other muscle spasm: Secondary | ICD-10-CM

## 2023-02-14 DIAGNOSIS — M25552 Pain in left hip: Secondary | ICD-10-CM

## 2023-02-14 DIAGNOSIS — M1612 Unilateral primary osteoarthritis, left hip: Secondary | ICD-10-CM | POA: Diagnosis not present

## 2023-02-14 MED ORDER — METOPROLOL SUCCINATE ER 25 MG PO TB24: 25.0000 mg | ORAL_TABLET | Freq: Every day | ORAL | 3 refills | Status: DC

## 2023-02-14 MED ORDER — ISOSORBIDE MONONITRATE ER 30 MG PO TB24: 30.0000 mg | ORAL_TABLET | Freq: Every day | ORAL | 3 refills | Status: DC

## 2023-02-14 MED ORDER — METHOCARBAMOL 500 MG PO TABS: 500.0000 mg | ORAL_TABLET | Freq: Two times a day (BID) | ORAL | 0 refills | Status: DC | PRN

## 2023-02-14 NOTE — Assessment & Plan Note (Signed)
Patient had a mechanical fall.  Did not hit her head or lose consciousness per her report

## 2023-02-14 NOTE — Progress Notes (Signed)
Primary Care Provider: Jinny Sanders, MD [place] Cardiologist: None Electrophysiologist: None  Referring Provider: Dr. Owens Loffler  [facility] Note: Chief Complaint  Patient presents with   1 month follow up     Discuss Coronary CTA. Patient c/o a burning in chest and a feeling of being smothered in the night. Medications reviewed by the patient verbally.    Coronary Artery Disease    Coronary CTA results.   ===================================  ASSESSMENT/PLAN   Problem List Items Addressed This Visit       Cardiology Problems   Hyperlipidemia LDL goal <70 (Chronic)    Most recent LDL was 81 on 40 mg atorvastatin.  Would like to see it less than 70, especially in light of Coronary CTA findings.  May need to consider switching to rosuvastatin or potentially having Zetia +/- Nexletol      Relevant Medications   isosorbide mononitrate (IMDUR) 30 MG 24 hr tablet   metoprolol succinate (TOPROL XL) 25 MG 24 hr tablet   Essential hypertension (Chronic)    Blood pressure is clearly elevated today, but was less elevated than last visit. We definitely have room to add low-dose Toprol 25 mg daily along with Imdur 30 mg daily.  Continue losartan.  Close follow-up      Relevant Medications   isosorbide mononitrate (IMDUR) 30 MG 24 hr tablet   metoprolol succinate (TOPROL XL) 25 MG 24 hr tablet   Other Relevant Orders   EKG 12-Lead   Coronary artery disease involving native coronary artery with angina pectoris ([place]) - Primary (Chronic)    Difficult to tell if her symptoms are truly anginal in nature.  Did not sound very consistent as there at rest, not exertional, and last at random durations.  However she does have evidence of severe single-vessel disease involving the ostial RCA.  Usually ostial lesions are not very favorable for PCI.  We had a long talk about her concerns with the symptoms.  She is not overly  worried about the nature of the symptoms, stating that they are not limiting her and she is somewhat scared of invasive procedures.  I presented options of aggressive evaluation which would mean going straight for cardiac catheterization and PCI if indicated versus simply a more conservative approach of trying medical management, and if symptoms persist or more worrisome, we would then consider invasive evaluation/management at that time.  She was much more in favor of the early conservative management with medical therapy with hopes to try to avoid invasive procedures.  Plan: Continue current dose of aspirin and statin (provided that on recheck lipids are improved with LDL less than 70) Continue current dose of losartan 50 mg daily,  Start Toprol 25 mg nightly along with 30 mg Imdur at dinnertime for antianginal benefit.   Close follow-up in roughly 2 months. If symptoms become more concerning at that time would potentially consider invasive evaluation.      Relevant Medications   isosorbide mononitrate (IMDUR) 30 MG 24 hr tablet   metoprolol succinate (TOPROL XL) 25 MG 24 hr tablet    ===================================  HPI:    Kristina Carlson is a 87 y.o. female with PMH notable for Essential Hypertension, HLD, hypothyroidism and Nonobstructive CAD by cath (2015) who is being seen today for the evaluation of CHEST PAIN at the request of Jinny Sanders, MD and Owens Loffler, MD.  Kristina Carlson was seen on January 10, 2023 for evaluation of chest pain.  Intermittent episodes of diffuse chest pain for the last month or so.*Since then her chest and radiates to both sides.  Taking both Protonix and Pepcid with no relief.  Symptoms usually worse with lying down or lying on side.  Better if she lifts the head of her bed up or sits up.  Pain not associate with exertion.   She says when the chest hurts, she can feel her pulsations up in her upper face and front of her ears. She says these episodes  usually occur at night and it makes it hard for her to sleep, but it does not necessarily wake her up from sleep.  She will oftentimes notice it after she gets up to go to the bathroom and then it is hard for her to get back to sleep.  Recent Hospitalizations: None  She was seen on January 4 by Dr. Lorelei Pont ([place] [facility])   Reviewed  CV studies:    The following studies were reviewed today: (if available, images/films reviewed: From Epic Chart or Care Everywhere) CARDIAC CATH 06/2014: Mild LAD 50%, OM 1 30%, 50% ostial RCA: Coronary CTA January 25, 2023: Coronary Calcium Score 384.  Severe (70 to 99%) ostial RCA=> FFR ct 0.76 (significant).  Mild proximal LAD and LCx stenosis (25 to 49%).   Interval History:   Kristina Carlson presents here today for follow-up evaluation for her chest discomfort-> described as a burning across chest - occurs @ night. Not every night. Not with exertion.  Not having palpitations.  Having episodes waking up trying to catch breath -- knows she is breathing, but not "getting enough air".    She says that the burning across her chest happens usually at night.  The main thing she wanted know was what was the cause.  She has been taking antacid medications to no effect.  The symptoms are not overall that worrisome to her now that she has a sense of what it could be related to.  CV Review of Symptoms (Summary): Cardiovascular ROS: positive for - chest pain, paroxysmal nocturnal dyspnea, and CP is more of "burning across her chest - worse @ night - not exertional.  PND Sx- wakes up feeling like she is not getting enough air -- knows she is breathing, but not getting enough breath - will sit up & go to fan to help. . negative for - dyspnea on exertion, edema, irregular heartbeat, loss of consciousness, orthopnea, palpitations, paroxysmal nocturnal dyspnea, rapid heart rate, or lightheadedness, dizziness or wooziness, syncope/near syncope or  TIA/amaurosis fugax, claudication  REVIEWED OF SYSTEMS   Review of Systems  Constitutional:  Positive for malaise/fatigue (Sedentary, does not really do much.). Negative for weight loss.  HENT:  Negative for congestion.   Respiratory:  Positive for shortness of breath (Baseline exertional dyspnea). Negative for cough.   Gastrointestinal:  Positive for heartburn. Negative for abdominal pain, blood in stool and melena.  Genitourinary:  Negative for hematuria.  Musculoskeletal:  Negative for back pain, falls and myalgias.  Neurological:  Negative for dizziness, focal weakness and weakness.  Endo/Heme/Allergies:  Does not bruise/bleed easily.  Psychiatric/Behavioral:  Positive for memory loss (Some, but not significant). Negative for depression. The patient is nervous/anxious. The patient does not have insomnia.   All other systems reviewed and are negative.  I have reviewed and (if needed) personally updated the patient's problem list, medications, allergies, past medical and surgical history, social and family  history.   PAST MEDICAL HISTORY   Past Medical History:  Diagnosis Date   Coronary artery disease involving native coronary artery with angina pectoris ([place]) 06/2014   CARDIAC CATH 06/2014: Mild LAD 50%, OM 1 30%, 50% ostial RCA:;; Coronary CTA February 2024 shows severe ostial RCA disease (FFR ct +0.76).  Mild proximal LAD and LCx stenosis.   Dysplastic nevus 01/24/2023   Right chest. Severe atypia, deep margin involved. Excision pending.   Hyperlipidemia    Hypertension    Hypothyroid    Insomnia    Macular degeneration    -- Dr. Laurey Arrow (optometrist).  Taking Ocuvite.    PAST SURGICAL HISTORY   Past Surgical History:  Procedure Laterality Date   Cardiac CTA  01/25/2023   Coronary Calcium Score 384.  Severe (70 to 99%) ostial RCA=> FFR ct 0.76 (significant).  Mild proximal LAD and LCx stenosis (25 to 49%).   LEFT HEART CATHETERIZATION WITH CORONARY ANGIOGRAM N/A  06/29/2014   Procedure: LEFT HEART CATHETERIZATION WITH CORONARY ANGIOGRAM;  Surgeon: Sinclair Grooms, MD;  Location: [facility] CATH LAB;  Service: Cardiovascular;: Mild LAD 50%, OM 1 30%, 50% ostial RCA:    Immunization History  Administered Date(s) Administered   Fluad Quad(high Dose 65+) 08/29/2019, 09/22/2020, 09/19/2021, 09/28/2022   Influenza Whole 11/12/2007, 09/17/2008, 10/14/2009, 10/25/2010, 09/05/2011   Influenza, High Dose Seasonal PF 08/20/2016, 08/30/2017, 08/30/2017, 09/17/2018   Influenza-Unspecified 08/24/2013, 09/22/2014, 09/17/2018   PFIZER(Purple Top)SARS-COV-2 Vaccination 02/29/2020, 03/22/2020, 10/13/2020   Pfizer Covid-19 Vaccine Bivalent Booster 67yr & up 12/26/2021   Pneumococcal Conjugate-13 06/22/2015, 02/17/2018, 02/17/2018   Pneumococcal Polysaccharide-23 11/23/2004   Tdap 09/05/2011, 02/28/2018, 03/06/2018   Zoster Recombinat (Shingrix) 05/26/2018, 07/26/2018   Zoster, Live 01/09/2018    MEDICATIONS/ALLERGIES   Current Meds  Medication Sig   aspirin 81 MG EC tablet TAKE 1 TABLET (81 MG TOTAL) BY MOUTH DAILY. SWALLOW WHOLE.   atorvastatin (LIPITOR) 40 MG tablet Take 1 tablet (40 mg total) by mouth daily.   Calcium Carb-Cholecalciferol (CALCIUM 600+D3) 600-200 MG-UNIT TABS Take 1 tablet by mouth daily.    CVS VITAMIN B12 1000 MCG tablet TAKE 1 TABLET BY MOUTH EVERY DAY   famotidine (PEPCID) 20 MG tablet TAKE 1 TABLET BY MOUTH TWICE A DAY   FIBER PO Take by mouth daily. Takes gummies   levothyroxine (SYNTHROID) 75 MCG tablet Take 0.5 tablets (37.5 mcg total) by mouth daily.   losartan (COZAAR) 50 MG tablet TAKE 1 TABLET BY MOUTH EVERY DAY   Multiple Vitamins-Minerals (OCUVITE PRESERVISION) TABS Take 1 tablet by mouth 2 (two) times a day.   ondansetron (ZOFRAN) 4 MG tablet Take 4 mg by mouth every 8 (eight) hours as needed.   pantoprazole (PROTONIX) 40 MG tablet Take 1 tablet (40 mg total) by mouth daily.   tobramycin (TOBREX) 0.3 % ophthalmic solution  SMARTSIG:In Eye(s)    Allergies  Allergen Reactions   Lisinopril Cough    SOCIAL HISTORY/FAMILY HISTORY   Reviewed in Epic:   Social History   Tobacco Use   Smoking status: Never   Smokeless tobacco: Never  Vaping Use   Vaping Use: Never used  Substance Use Topics   Alcohol use: No   Drug use: No   Social History   Social History Narrative   01/25/21   From: the area   Living: alone, has family nearby   Work: retired - hSpecial educational needs teacher      Family: 1 child - GMarya Amsler- 2 grandchildren and 2 great grandchildren  Enjoys: spend time with friends, reading      Exercise: not currently   Diet: pretty good, healthy foods, 3 meals a day      Safety   Seat belts: Yes    Guns: No   Safe in relationships: Yes    Family History  Problem Relation Age of Onset   Stroke Father    Diabetes Brother    Lymphoma Brother    Breast cancer Cousin        maternal cousin    OBJCTIVE -PE, EKG, labs   Wt Readings from Last 3 Encounters:  02/14/23 161 lb 8 oz (73.3 kg)  02/06/23 164 lb 2 oz (74.4 kg)  01/10/23 162 lb 12.8 oz (73.8 kg)    Physical Exam: BP (!) 150/76 (BP Location: Left Arm, Patient Position: Sitting, Cuff Size: Normal)   Pulse 88   Ht 5' 3"$  (1.6 m)   Wt 161 lb 8 oz (73.3 kg)   SpO2 98%   BMI 28.61 kg/m  Physical Exam Vitals reviewed.  Constitutional:      General: She is not in acute distress.    Appearance: Normal appearance. She is normal weight. She is not ill-appearing (Well-nourished, well-groomed.  Healthy-appearing.) or toxic-appearing.  HENT:     Head: Normocephalic and atraumatic.  Neck:     Vascular: No carotid bruit or JVD.  Cardiovascular:     Rate and Rhythm: Normal rate and regular rhythm.     Chest Wall: PMI is not displaced.     Pulses: Normal pulses.     Heart sounds: S1 normal and S2 normal. Heart sounds are distant. No murmur heard.    No friction rub. No gallop.  Pulmonary:     Effort: Pulmonary effort is normal. No respiratory  distress.     Breath sounds: Normal breath sounds. No wheezing, rhonchi or rales.  Chest:     Chest wall: No tenderness.  Musculoskeletal:        General: No swelling. Normal range of motion.     Cervical back: Normal range of motion and neck supple.  Skin:    General: Skin is warm and dry.  Neurological:     General: No focal deficit present.     Mental Status: She is alert and oriented to person, place, and time.  Psychiatric:        Mood and Affect: Mood normal.        Behavior: Behavior normal.        Thought Content: Thought content normal.        Judgment: Judgment normal.     Adult ECG Report  Rate: 103;  Rhythm: sinus tachycardia and low voltage.  Cannot rule out inferior MI, age-indeterminate or a tear MI, age-indeterminate. ;   Narrative Interpretation: Borderline.  Stable.  Recent Labs: Reviewed. Lab Results  Component Value Date   CHOL 148 01/07/2023   HDL 43.50 01/07/2023   LDLCALC 81 01/07/2023   LDLDIRECT 84.0 06/06/2021   TRIG 116.0 01/07/2023   CHOLHDL 3 01/07/2023   Lab Results  Component Value Date   CREATININE 0.91 01/10/2023   BUN 14 01/10/2023   NA 140 01/10/2023   K 4.1 01/10/2023   CL 105 01/10/2023   CO2 28 01/10/2023      Latest Ref Rng & Units 06/06/2021   11:53 AM 04/15/2019    1:38 PM 06/19/2018   10:30 AM  CBC  WBC 4.0 - 10.5 K/uL 4.0  4.5  3.5   Hemoglobin  12.0 - 15.0 g/dL 14.1  14.6  14.4   Hematocrit 36.0 - 46.0 % 41.6  44.4  43.1   Platelets 150.0 - 400.0 K/uL 204.0  206  240     Lab Results  Component Value Date   HGBA1C 5.5 10/03/2016   Lab Results  Component Value Date   TSH 11.13 (H) 02/06/2023    ================================================== I spent a total of 33 minutes with the patient spent in direct patient consultation.  We spent a long time going through the results of her study and discussing treatment options. Additional time spent with chart review  / charting (studies, outside notes, etc): 24  min  Total Time: 57 min  Current medicines are reviewed at length with the patient today.  (+/- concerns) N/A  Notice: This dictation was prepared with Dragon dictation along with smart phrase technology. Any transcriptional errors that result from this process are unintentional and may not be corrected upon review.   Studies Ordered:  Orders Placed This Encounter  Procedures   EKG 12-Lead   Meds ordered this encounter  Medications   isosorbide mononitrate (IMDUR) 30 MG 24 hr tablet    Sig: Take 1 tablet (30 mg total) by mouth at bedtime.    Dispense:  90 tablet    Refill:  3   metoprolol succinate (TOPROL XL) 25 MG 24 hr tablet    Sig: Take 1 tablet (25 mg total) by mouth at bedtime.    Dispense:  90 tablet    Refill:  3    Patient Instructions / Medication Changes & Studies & Tests Ordered   Patient Instructions  Medication Instructions:  Your physician has recommended you make the following change in your medication:   START Isosorbide mononitrate 30 mg once daily at bedtime with your aspirin START Toprol XL 25 mg at bedtime  *If you need a refill on your cardiac medications before your next appointment, please call your pharmacy*   Lab Work: None  If you have labs (blood work) drawn today and your tests are completely normal, you will receive your results only by: [place] (if you have MyChart) OR A paper copy in the mail If you have any lab test that is abnormal or we need to change your treatment, we will call you to review the results.   Testing/Procedures: None   Follow-Up: At [facility], you and your health needs are our priority.  As part of our continuing mission to provide you with exceptional heart care, we have created designated Provider Care Teams.  These Care Teams include your primary Cardiologist (physician) and Advanced Practice Providers (APPs -  Physician Assistants and Nurse Practitioners) who all work together to provide  you with the care you need, when you need it.   Your next appointment:   2 month(s)  Provider:   You may see Dr. Glenetta Hew or one of the following Advanced Practice Providers on your designated Care Team:   Murray Hodgkins, NP Christell Faith, PA-C Cadence Kathlen Mody, PA-C Gerrie Nordmann, NP      Leonie Man, MD, MS Glenetta Hew, M.D., M.S. Interventional Cardiologist  [facility]   [address]; [place], [place]  [address] [phone]           Fax [phone]    Thank you for choosing [facility] in [place]!!

## 2023-02-14 NOTE — Assessment & Plan Note (Signed)
Most recent LDL was 81 on 40 mg atorvastatin.  Would like to see it less than 70, especially in light of Coronary CTA findings.  May need to consider switching to rosuvastatin or potentially having Zetia +/- Nexletol

## 2023-02-14 NOTE — Patient Instructions (Signed)
Medication Instructions:  Your physician has recommended you make the following change in your medication:   START Isosorbide mononitrate 30 mg once daily at bedtime with your aspirin START Toprol XL 25 mg at bedtime  *If you need a refill on your cardiac medications before your next appointment, please call your pharmacy*   Lab Work: None  If you have labs (blood work) drawn today and your tests are completely normal, you will receive your results only by: [place] (if you have MyChart) OR A paper copy in the mail If you have any lab test that is abnormal or we need to change your treatment, we will call you to review the results.   Testing/Procedures: None   Follow-Up: At [facility], you and your health needs are our priority.  As part of our continuing mission to provide you with exceptional heart care, we have created designated Provider Care Teams.  These Care Teams include your primary Cardiologist (physician) and Advanced Practice Providers (APPs -  Physician Assistants and Nurse Practitioners) who all work together to provide you with the care you need, when you need it.   Your next appointment:   2 month(s)  Provider:   You may see Dr. Glenetta Hew or one of the following Advanced Practice Providers on your designated Care Team:   Murray Hodgkins, NP Christell Faith, PA-C Cadence Kathlen Mody, PA-C Gerrie Nordmann, NP

## 2023-02-14 NOTE — Patient Instructions (Addendum)
Nice to see you today I have sent in a muscle relaxer. It is not suppose to make you sleepy but still may So use caution Follow up if no improvement

## 2023-02-14 NOTE — Progress Notes (Signed)
Acute Office Visit  Subjective:     Patient ID: MATTIA WESSNER, female    DOB: 1936-09-03, 87 y.o.   MRN: [id number]  Chief Complaint  Patient presents with   Fall    Sunday      Patient is in today for fall with a history of HTN, CAD, HER, Hypothyroidism, Osteopenia.  Patient was seen by cardiology earlier in the day  States that she fell on Sunday. States that she was going up to the porch. States that she lead with the wrong leg and then she lost balance and fell strike left hip. States that she is having hip pian. States that with movement and sometime siwth sitting. States that it is a different pain . Described as a catch. States that it is described as a sharp pain States that she is using ibuprofen, heat and ice that does help Still able to ambulate    Review of Systems  Constitutional:  Negative for chills and fever.  Respiratory:  Negative for shortness of breath.   Cardiovascular:  Negative for chest pain.  Musculoskeletal:  Positive for joint pain.  Neurological:  Negative for tingling and weakness.        Objective:    BP (!) 142/76   Pulse 83   Temp 97.9 F (36.6 C)   Resp 16   Ht 5' 3"$  (1.6 m)   Wt 160 lb 2 oz (72.6 kg)   SpO2 98%   BMI 28.36 kg/m    Physical Exam Vitals and nursing note reviewed.  Constitutional:      Appearance: Normal appearance.  Cardiovascular:     Rate and Rhythm: Normal rate and regular rhythm.     Heart sounds: Normal heart sounds.  Pulmonary:     Effort: Pulmonary effort is normal.     Breath sounds: Normal breath sounds.  Musculoskeletal:        General: Tenderness present.       Legs:     Comments: Pain with AROM and PROM With flexion and abduction  2+ DP pulse bilaterally     Neurological:     Mental Status: She is alert.     Comments: Bilateral lower extremity strength 5/5.     No results found for any visits on 02/14/23.      Assessment & Plan:   Problem List Items Addressed This Visit        Other   Muscle spasm of left lower extremity    Muscle tightness what sounds like to be spasms.  Will write methocarbamol 500 mg twice daily as needed.  Did inform patient should not make her sleepy but we did review sedation precautions and the medication making her dizzy      Relevant Medications   methocarbamol (ROBAXIN) 500 MG tablet   Left hip pain - Primary    Underlying bursitis already.  Patient fell striking On wood flooring does have a history of osteopenia and pain with movement obtain hip x-ray.  Pending result      Relevant Orders   DG Hip Unilat W OR W/O Pelvis 2-3 Views Left   Fall    Patient had a mechanical fall.  Did not hit her head or lose consciousness per her report      Relevant Orders   DG Hip Unilat W OR W/O Pelvis 2-3 Views Left    Meds ordered this encounter  Medications   methocarbamol (ROBAXIN) 500 MG tablet    Sig: Take 1  tablet (500 mg total) by mouth 2 (two) times daily as needed for muscle spasms.    Dispense:  15 tablet    Refill:  0    Order Specific Question:   Supervising Provider    Answer:   TOWER, MARNE A [[id number]    Return if symptoms worsen or fail to improve.  Romilda Garret, NP

## 2023-02-14 NOTE — Assessment & Plan Note (Signed)
Underlying bursitis already.  Patient fell striking On wood flooring does have a history of osteopenia and pain with movement obtain hip x-ray.  Pending result

## 2023-02-14 NOTE — Assessment & Plan Note (Signed)
Blood pressure is clearly elevated today, but was less elevated than last visit. We definitely have room to add low-dose Toprol 25 mg daily along with Imdur 30 mg daily.  Continue losartan.  Close follow-up

## 2023-02-14 NOTE — Assessment & Plan Note (Signed)
Difficult to tell if her symptoms are truly anginal in nature.  Did not sound very consistent as there at rest, not exertional, and last at random durations.  However she does have evidence of severe single-vessel disease involving the ostial RCA.  Usually ostial lesions are not very favorable for PCI.  We had a long talk about her concerns with the symptoms.  She is not overly worried about the nature of the symptoms, stating that they are not limiting her and she is somewhat scared of invasive procedures.  I presented options of aggressive evaluation which would mean going straight for cardiac catheterization and PCI if indicated versus simply a more conservative approach of trying medical management, and if symptoms persist or more worrisome, we would then consider invasive evaluation/management at that time.  She was much more in favor of the early conservative management with medical therapy with hopes to try to avoid invasive procedures.  Plan: Continue current dose of aspirin and statin (provided that on recheck lipids are improved with LDL less than 70) Continue current dose of losartan 50 mg daily,  Start Toprol 25 mg nightly along with 30 mg Imdur at dinnertime for antianginal benefit.   Close follow-up in roughly 2 months. If symptoms become more concerning at that time would potentially consider invasive evaluation.

## 2023-02-14 NOTE — Assessment & Plan Note (Signed)
Muscle tightness what sounds like to be spasms.  Will write methocarbamol 500 mg twice daily as needed.  Did inform patient should not make her sleepy but we did review sedation precautions and the medication making her dizzy

## 2023-02-18 ENCOUNTER — Ambulatory Visit: Payer: Medicare Other | Admitting: Family Medicine

## 2023-02-18 ENCOUNTER — Telehealth: Payer: Self-pay | Admitting: Nurse Practitioner

## 2023-02-18 NOTE — Telephone Encounter (Signed)
Called and informed pt but she said that she wanted to give it a few more days, and if not better she would call and make an appointment with Dr. Lorelei Pont.

## 2023-02-18 NOTE — Telephone Encounter (Signed)
I would have her make an appt with Dr. Lorelei Pont

## 2023-02-18 NOTE — Telephone Encounter (Signed)
-----   Message from [place], [place] sent at 02/18/2023 11:22 AM EST ----- Called pt and she stated that her legs still hurt in the back to the knee and the muscle relaxer has not helped any.

## 2023-02-26 DIAGNOSIS — H35363 Drusen (degenerative) of macula, bilateral: Secondary | ICD-10-CM | POA: Diagnosis not present

## 2023-02-26 DIAGNOSIS — H353134 Nonexudative age-related macular degeneration, bilateral, advanced atrophic with subfoveal involvement: Secondary | ICD-10-CM | POA: Diagnosis not present

## 2023-02-26 DIAGNOSIS — H43813 Vitreous degeneration, bilateral: Secondary | ICD-10-CM | POA: Diagnosis not present

## 2023-02-27 NOTE — Progress Notes (Signed)
Kristina Aven T. Monta Maiorana, MD, [place] at [facility] [place], [address]  Phone: [phone]  FAX: [phone]  Kristina Carlson - 87 y.o. female  MRN [id number]  Date of Birth: 08/04/1936  Date: 02/28/2023  PCP: Jinny Sanders, MD  Referral: Jinny Sanders, MD  Chief Complaint  Patient presents with   Hip Pain    Left   Subjective:   Kristina Carlson is a 87 y.o. very pleasant female patient with Body mass index is 28.74 kg/m. who presents with the following:  The patient presents after an acute fall where she struck her left hip.  Previously saw her almost 1 month ago with some trochanteric bursitis on the left hip.  She previously saw Mr. Cable, and at that point did place her on some methocarbamol, but she has continued pain and she is here in follow-up today.  Fell on her L hip.  Having a lot of pain at the [place].  She is really having pain all over the entirety of the left lateral hip and also pain just caudal to the proximal end of the femur.  She does not have any bruising or swelling.  Review of Systems is noted in the HPI, as appropriate  Objective:   BP (!) 150/70   Pulse 71   Temp (!) 97.5 F (36.4 C) (Temporal)   Ht '5\' 3"'$  (1.6 m)   Wt 162 lb 4 oz (73.6 kg)   SpO2 100%   BMI 28.74 kg/m   GEN: No acute distress; alert,appropriate. PULM: Breathing comfortably in no respiratory distress PSYCH: Normally interactive.    HIP EXAM: SIDE: Left ROM: Abduction, Flexion, Internal and External range of motion: Approaching full Pain with terminal IROM and EROM: She does have some lateral pain with terminal external range of motion GTB: NT SLR: NEG Knees: No effusion FABER: NT REVERSE FABER: NT, neg Piriformis: NT at direct palpation Str: flexion: 4/5 abduction: 4/5 adduction: 4/5 Strength testing non-tender    Laboratory and Imaging Data:  Assessment and Plan:     ICD-10-CM   1. Left hip pain   M25.552     2. Fall, initial encounter  W19.XXXA      Bone contusion and trochanteric bursitis after traumatic fall.  This is in the setting of prior chronic trochanteric bursitis and now with some acute exacerbation due to trauma.  I am going to avoid injecting her hip again, and I suspect that a lot of this will improve with time alone.  I am going to place her on some oral prednisone, hopefully this will calm things down faster.  Medication Management during today's office visit: Meds ordered this encounter  Medications   predniSONE (DELTASONE) 20 MG tablet    Sig: 2 tabs po daily for 5 days, then 1 tab po daily for 5 days    Dispense:  15 tablet    Refill:  0   There are no discontinued medications.  Orders placed today for conditions managed today: No orders of the defined types were placed in this encounter.   Disposition: No follow-ups on file.  Dr[employer] One speech-to-text software was used for transcription in this dictation.  Possible transcriptional errors can occur using Editor, commissioning.   Signed,  Maud Deed. Aaren Krog, MD   Outpatient Encounter Medications as of 02/28/2023  Medication Sig   aspirin 81 MG EC tablet TAKE 1 TABLET (81 MG TOTAL) BY MOUTH DAILY. SWALLOW  WHOLE.   atorvastatin (LIPITOR) 40 MG tablet Take 1 tablet (40 mg total) by mouth daily.   Calcium Carb-Cholecalciferol (CALCIUM 600+D3) 600-200 MG-UNIT TABS Take 1 tablet by mouth daily.    [employer] VITAMIN B12 1000 MCG tablet TAKE 1 TABLET BY MOUTH EVERY DAY   famotidine (PEPCID) 20 MG tablet TAKE 1 TABLET BY MOUTH TWICE A DAY   FIBER PO Take by mouth daily. Takes gummies   isosorbide mononitrate (IMDUR) 30 MG 24 hr tablet Take 1 tablet (30 mg total) by mouth at bedtime.   levothyroxine (SYNTHROID) 75 MCG tablet Take 0.5 tablets (37.5 mcg total) by mouth daily.   losartan (COZAAR) 50 MG tablet TAKE 1 TABLET BY MOUTH EVERY DAY   methocarbamol (ROBAXIN) 500 MG tablet Take 1 tablet (500 mg total) by mouth  2 (two) times daily as needed for muscle spasms.   metoprolol succinate (TOPROL XL) 25 MG 24 hr tablet Take 1 tablet (25 mg total) by mouth at bedtime.   Multiple Vitamins-Minerals (OCUVITE PRESERVISION) TABS Take 1 tablet by mouth 2 (two) times a day.   ondansetron (ZOFRAN) 4 MG tablet Take 4 mg by mouth every 8 (eight) hours as needed.   pantoprazole (PROTONIX) 40 MG tablet Take 1 tablet (40 mg total) by mouth daily.   predniSONE (DELTASONE) 20 MG tablet 2 tabs po daily for 5 days, then 1 tab po daily for 5 days   tobramycin (TOBREX) 0.3 % ophthalmic solution SMARTSIG:In Eye(s)   No facility-administered encounter medications on file as of 02/28/2023.

## 2023-02-28 ENCOUNTER — Telehealth: Payer: Self-pay

## 2023-02-28 ENCOUNTER — Ambulatory Visit (INDEPENDENT_AMBULATORY_CARE_PROVIDER_SITE_OTHER): Payer: Medicare Other | Admitting: Family Medicine

## 2023-02-28 ENCOUNTER — Encounter: Payer: Self-pay | Admitting: Family Medicine

## 2023-02-28 VITALS — BP 150/70 | HR 71 | Temp 97.5°F | Ht 63.0 in | Wt 162.2 lb

## 2023-02-28 DIAGNOSIS — M25552 Pain in left hip: Secondary | ICD-10-CM

## 2023-02-28 DIAGNOSIS — W19XXXA Unspecified fall, initial encounter: Secondary | ICD-10-CM | POA: Diagnosis not present

## 2023-02-28 MED ORDER — PREDNISONE 20 MG PO TABS: ORAL_TABLET | ORAL | 0 refills | Status: DC

## 2023-02-28 NOTE — Telephone Encounter (Signed)
Patient left voicemail she is having to start prednisone. She would like to know if OK to keep surgery appointment next Wednesday?

## 2023-02-28 NOTE — Telephone Encounter (Signed)
I would recommend postponing for 2-4 weeks so prednisone is out of her system as it increases risk of infection and poor wound healing with surgery.

## 2023-02-28 NOTE — Telephone Encounter (Signed)
Spoke with patient. She prefers to have surgery as scheduled. She states she will start prednisone at a later date.

## 2023-02-28 NOTE — Telephone Encounter (Signed)
Okay. Thank you.

## 2023-03-06 ENCOUNTER — Ambulatory Visit: Payer: Medicare Other | Admitting: Dermatology

## 2023-03-06 ENCOUNTER — Encounter: Payer: Self-pay | Admitting: Dermatology

## 2023-03-06 VITALS — BP 155/82 | HR 59

## 2023-03-06 DIAGNOSIS — D225 Melanocytic nevi of trunk: Secondary | ICD-10-CM

## 2023-03-06 DIAGNOSIS — D492 Neoplasm of unspecified behavior of bone, soft tissue, and skin: Secondary | ICD-10-CM

## 2023-03-06 MED ORDER — MUPIROCIN 2 % EX OINT: 1.0000 | TOPICAL_OINTMENT | Freq: Every day | CUTANEOUS | 0 refills | Status: DC

## 2023-03-06 NOTE — Progress Notes (Deleted)
Follow-Up Visit   Subjective  Kristina Carlson is a 87 y.o. female who presents for the following: Skin Cancer Screening and Full Body Skin Exam  The patient presents for Total-Body Skin Exam (TBSE) for skin cancer screening and mole check. The patient has spots, moles and lesions to be evaluated, some may be new or changing and the patient has concerns that these could be cancer.    The following portions of the chart were reviewed this encounter and updated as appropriate: medications, allergies, medical history  Review of Systems:  No other skin or systemic complaints except as noted in HPI or Assessment and Plan.  Objective  Well appearing patient in no apparent distress; mood and affect are within normal limits.  A full examination was performed including scalp, head, eyes, ears, nose, lips, neck, chest, axillae, abdomen, back, buttocks, bilateral upper extremities, bilateral lower extremities, hands, feet, fingers, toes, fingernails, and toenails. All findings within normal limits unless otherwise noted below.    right chest Pink bx site    Assessment & Plan   Neoplasm of skin right chest  Skin excision  Informed consent: discussed and consent obtained   Timeout: patient name, date of birth, surgical site, and procedure verified   Procedure prep:  Patient was prepped and draped in usual sterile fashion Prep type:  Chlorhexidine Anesthesia: the lesion was anesthetized in a standard fashion   Anesthetic:  1% lidocaine w/ epinephrine 1-100,000 buffered w/ 8.4% NaHCO3 Hemostasis achieved with: pressure and electrodesiccation    Skin repair Complexity:  Intermediate Informed consent: discussed and consent obtained   Timeout: patient name, date of birth, surgical site, and procedure verified   Procedure prep:  Patient was prepped and draped in usual sterile fashion Prep type:  Chlorhexidine Anesthesia: the lesion was anesthetized in a standard fashion   Anesthetic:  1%  lidocaine w/ epinephrine 1-100,000 local infiltration Reason for type of repair: reduce tension to allow closure, reduce the risk of dehiscence, infection, and necrosis, preserve normal anatomy and preserve normal anatomical and functional relationships   Undermining: edges undermined   Subcutaneous layers (deep stitches):  Suture type: Vicryl (polyglactin 910)   Fine/surface layer approximation (top stitches):  Hemostasis achieved with: suture, pressure and electrodesiccation Outcome: patient tolerated procedure well with no complications   Post-procedure details: wound care instructions given   Additional details:  Mupirocin and a pressure dressing applied  Specimen 1 - Surgical pathology Differential Diagnosis: Bx proven DYSPLASTIC JUNCTIONAL LENTIGINOUS NEVUS WITH SEVERE ATYPIA  Check Margins: yes Pink bx site [id number]   Lentigines - Scattered tan macules - Due to sun exposure - Benign-appearing, observe - Recommend daily broad spectrum sunscreen SPF 30+ to sun-exposed areas, reapply every 2 hours as needed. - Call for any changes  Seborrheic Keratoses - Stuck-on, waxy, tan-brown papules and/or plaques  - Benign-appearing - Discussed benign etiology and prognosis. - Observe - Call for any changes  Melanocytic Nevi - Tan-brown and/or pink-flesh-colored symmetric macules and papules - Benign appearing on exam today - Observation - Call clinic for new or changing moles - Recommend daily use of broad spectrum spf 30+ sunscreen to sun-exposed areas.   Hemangiomas - Red papules - Discussed benign nature - Observe - Call for any changes  Actinic Damage - Chronic condition, secondary to cumulative UV/sun exposure - diffuse scaly erythematous macules with underlying dyspigmentation - Recommend daily broad spectrum sunscreen SPF 30+ to sun-exposed areas, reapply every 2 hours as needed.  - Staying in the shade or  wearing long sleeves, sun glasses (UVA+UVB protection) and  wide brim hats (4-inch brim around the entire circumference of the hat) are also recommended for sun protection.  - Call for new or changing lesions.  Skin cancer screening performed today.

## 2023-03-06 NOTE — Patient Instructions (Signed)
Wound Care Instructions for After Surgery  On the day following your surgery, you should begin doing daily dressing changes until your sutures are removed: Remove the bandage. Cleanse the wound gently with soap and water.  Make sure you then dry the skin surrounding the wound completely or the tape will not stick to the skin. Do not use cotton balls on the wound. After the wound is clean and dry, apply the ointment (either prescription antibiotic prescribed by your doctor or plain Vaseline if nothing was prescribed) gently with a Q-tip. If you are using a bandaid to cover: Apply a bandaid large enough to cover the entire wound. If you do not have a bandaid large enough to cover the wound OR if you are sensitive to bandaid adhesive: Cut a non-stick pad (such as Telfa) to fit the size of the wound.  Cover the wound with the non-stick pad. If the wound is draining, you may want to add a small amount of gauze on top of the non-stick pad for a little added compression to the area. Use tape to seal the area completely.  For the next 1-2 weeks: Be sure to keep the wound moist with ointment 24/7 to ensure best healing. If you are unable to cover the wound with a bandage to hold the ointment in place, you may need to reapply the ointment several times a day. Do not bend over or lift heavy items to reduce the chance of elevated blood pressure to the wound. Do not participate in particularly strenuous activities.  Below is a list of dressing supplies you might need.  Cotton-tipped applicators - Q-tips Gauze pads (2x2 and/or 4x4) - All-Purpose Sponges New and clean tube of petroleum jelly (Vaseline) OR prescription antibiotic ointment if prescribed Either a bandaid large enough to cover the entire wound OR non-stick dressing material (Telfa) and Tape (Paper or Hypafix)  FOR ADULT SURGERY PATIENTS: If you need something for pain relief, you may take 1 extra strength Tylenol (acetaminophen) and 2  ibuprofen (200 mg) together every 4 hours as needed. (Do not take these medications if you are allergic to them or if you know you cannot take them for any other reason). Typically you may only need pain medication for 1-3 days.   Comments on the Post-Operative Period Slight swelling and redness often appear around the wound. This is normal and will disappear within several days following the surgery. The healing wound will drain a brownish-red-yellow discharge during healing. This is a normal phase of wound healing. As the wound begins to heal, the drainage may increase in amount. Again, this drainage is normal. Notify us if the drainage becomes persistently bloody, excessively swollen, or intensely painful or develops a foul odor or red streaks.  The healing wound will also typically be itchy. This is normal. If you have severe or persistent pain, Notify us if the discomfort is severe or persistent. Avoid alcoholic beverages when taking pain medicine.  In Case of Wound Hemorrhage A wound hemorrhage is when the bandage suddenly becomes soaked with bright red blood and flows profusely. If this happens, sit down or lie down with your head elevated. If the wound has a dressing on it, do not remove the dressing. Apply pressure to the existing gauze. If the wound is not covered, use a gauze pad to apply pressure and continue applying the pressure for 20 minutes without peeking. DO NOT COVER THE WOUND WITH A LARGE TOWEL OR WASH CLOTH. Release your hand from the   wound site but do not remove the dressing. If the bleeding has stopped, gently clean around the wound. Leave the dressing in place for 24 hours if possible. This wait time allows the blood vessels to close off so that you do not spark a new round of bleeding by disrupting the newly clotted blood vessels with an immediate dressing change. If the bleeding does not subside, continue to hold pressure for 40 minutes. If bleeding continues, page your  physician, contact an After Hours clinic or go to the Emergency Room.   Due to recent changes in healthcare laws, you may see results of your pathology and/or laboratory studies on MyChart before the doctors have had a chance to review them. We understand that in some cases there may be results that are confusing or concerning to you. Please understand that not all results are received at the same time and often the doctors may need to interpret multiple results in order to provide you with the best plan of care or course of treatment. Therefore, we ask that you please give us 2 business days to thoroughly review all your results before contacting the office for clarification. Should we see a critical lab result, you will be contacted sooner.   If You Need Anything After Your Visit  If you have any questions or concerns for your doctor, please call our main line at [phone] and press option 4 to reach your doctor's medical assistant. If no one answers, please leave a voicemail as directed and we will return your call as soon as possible. Messages left after 4 pm will be answered the following business day.   You may also send us a message via MyChart. We typically respond to MyChart messages within 1-2 business days.  For prescription refills, please ask your pharmacy to contact our office. Our fax number is [phone].  If you have an urgent issue when the clinic is closed that cannot wait until the next business day, you can page your doctor at the number below.    Please note that while we do our best to be available for urgent issues outside of office hours, we are not available 24/7.   If you have an urgent issue and are unable to reach us, you may choose to seek medical care at your doctor's office, retail clinic, urgent care center, or emergency room.  If you have a medical emergency, please immediately call 911 or go to the emergency department.  Pager Numbers  - Dr. Kowalski:  [phone]  - Dr. Moye: [phone]  - Dr. Stewart: [phone]  In the event of inclement weather, please call our main line at [phone] for an update on the status of any delays or closures.  Dermatology Medication Tips: Please keep the boxes that topical medications come in in order to help keep track of the instructions about where and how to use these. Pharmacies typically print the medication instructions only on the boxes and not directly on the medication tubes.   If your medication is too expensive, please contact our office at [phone] option 4 or send us a message through MyChart.   We are unable to tell what your co-pay for medications will be in advance as this is different depending on your insurance coverage. However, we may be able to find a substitute medication at lower cost or fill out paperwork to get insurance to cover a needed medication.   If a prior authorization is required to get your medication covered   by your insurance company, please allow us 1-2 business days to complete this process.  Drug prices often vary depending on where the prescription is filled and some pharmacies may offer cheaper prices.  The website www.goodrx.com contains coupons for medications through different pharmacies. The prices here do not account for what the cost may be with help from insurance (it may be cheaper with your insurance), but the website can give you the price if you did not use any insurance.  - You can print the associated coupon and take it with your prescription to the pharmacy.  - You may also stop by our office during regular business hours and pick up a GoodRx coupon card.  - If you need your prescription sent electronically to a different pharmacy, notify our office through [facility] MyChart or by phone at [phone] option 4.     Si Usted Necesita Algo Despus de Su Visita  Tambin puede enviarnos un mensaje a travs de MyChart. Por lo general  respondemos a los mensajes de MyChart en el transcurso de 1 a 2 das hbiles.  Para renovar recetas, por favor pida a su farmacia que se ponga en contacto con nuestra oficina. Nuestro nmero de fax es el [phone].  Si tiene un asunto urgente cuando la clnica est cerrada y que no puede esperar hasta el siguiente da hbil, puede llamar/localizar a su doctor(a) al nmero que aparece a continuacin.   Por favor, tenga en cuenta que aunque hacemos todo lo posible para estar disponibles para asuntos urgentes fuera del horario de oficina, no estamos disponibles las 24 horas del da, los 7 das de la semana.   Si tiene un problema urgente y no puede comunicarse con nosotros, puede optar por buscar atencin mdica  en el consultorio de su doctor(a), en una clnica privada, en un centro de atencin urgente o en una sala de emergencias.  Si tiene una emergencia mdica, por favor llame inmediatamente al 911 o vaya a la sala de emergencias.  Nmeros de bper  - Dr. Kowalski: [phone]  - Dra. Moye: [phone]  - Dra. Stewart: [phone]  En caso de inclemencias del tiempo, por favor llame a nuestra lnea principal al [phone] para una actualizacin sobre el estado de cualquier retraso o cierre.  Consejos para la medicacin en dermatologa: Por favor, guarde las cajas en las que vienen los medicamentos de uso tpico para ayudarle a seguir las instrucciones sobre dnde y cmo usarlos. Las farmacias generalmente imprimen las instrucciones del medicamento slo en las cajas y no directamente en los tubos del medicamento.   Si su medicamento es muy caro, por favor, pngase en contacto con nuestra oficina llamando al [phone] y presione la opcin 4 o envenos un mensaje a travs de MyChart.   No podemos decirle cul ser su copago por los medicamentos por adelantado ya que esto es diferente dependiendo de la cobertura de su seguro. Sin embargo, es posible que podamos encontrar un  medicamento sustituto a menor costo o llenar un formulario para que el seguro cubra el medicamento que se considera necesario.   Si se requiere una autorizacin previa para que su compaa de seguros cubra su medicamento, por favor permtanos de 1 a 2 das hbiles para completar este proceso.  Los precios de los medicamentos varan con frecuencia dependiendo del lugar de dnde se surte la receta y alguna farmacias pueden ofrecer precios ms baratos.  El sitio web www.goodrx.com tiene cupones para medicamentos de diferentes farmacias. Los precios aqu no   tienen en cuenta lo que podra costar con la ayuda del seguro (puede ser ms barato con su seguro), pero el sitio web puede darle el precio si no utiliz ningn seguro.  - Puede imprimir el cupn correspondiente y llevarlo con su receta a la farmacia.  - Tambin puede pasar por nuestra oficina durante el horario de atencin regular y recoger una tarjeta de cupones de GoodRx.  - Si necesita que su receta se enve electrnicamente a una farmacia diferente, informe a nuestra oficina a travs de MyChart de  o por telfono llamando al [phone] y presione la opcin 4.   

## 2023-03-06 NOTE — Progress Notes (Signed)
   Follow-Up Visit   Subjective  Kristina Carlson is a 87 y.o. female who presents for the following: Procedure (Patient here today for excision of DYSPLASTIC JUNCTIONAL [place] at right chest.).   The following portions of the chart were reviewed this encounter and updated as appropriate:   Tobacco  Allergies  Meds  Problems  Med Hx  Surg Hx  Fam Hx      Review of Systems:  No other skin or systemic complaints except as noted in HPI or Assessment and Plan.  Objective  Well appearing patient in no apparent distress; mood and affect are within normal limits.  A focused examination was performed including chest. Relevant physical exam findings are noted in the Assessment and Plan.  right chest Pink bx site    Assessment & Plan  Neoplasm of skin right chest  Skin excision  Lesion length (cm):  1.3 Total excision diameter (cm):  2.3 Informed consent: discussed and consent obtained   Timeout: patient name, date of birth, surgical site, and procedure verified   Procedure prep:  Patient was prepped and draped in usual sterile fashion Prep type:  Chlorhexidine Anesthesia: the lesion was anesthetized in a standard fashion   Anesthetic:  1% lidocaine w/ epinephrine 1-100,000 buffered w/ 8.4% NaHCO3 (6 cc lido w/epi, 6 cc bupivicaine) Instrument used comment:  15c Hemostasis achieved with: pressure and electrodesiccation    Skin repair Complexity:  Intermediate Final length (cm):  5.9 Informed consent: discussed and consent obtained   Timeout: patient name, date of birth, surgical site, and procedure verified   Procedure prep:  Patient was prepped and draped in usual sterile fashion Prep type:  Chlorhexidine Anesthesia: the lesion was anesthetized in a standard fashion   Anesthetic:  1% lidocaine w/ epinephrine 1-100,000 local infiltration Reason for type of repair: reduce tension to allow closure, reduce the risk of dehiscence, infection, and  necrosis, preserve normal anatomy and preserve normal anatomical and functional relationships   Undermining: edges undermined   Subcutaneous layers (deep stitches):  Suture size:  3-0 Suture type: Vicryl (polyglactin 910)   Fine/surface layer approximation (top stitches):  Suture size:  4-0 Suture type: Prolene (polypropylene)   Suture removal (days):  7 Hemostasis achieved with: suture, pressure and electrodesiccation Outcome: patient tolerated procedure well with no complications   Post-procedure details: wound care instructions given   Additional details:  Mupirocin and a pressure dressing applied  mupirocin ointment (BACTROBAN) 2 % Apply 1 Application topically daily.  Specimen 1 - Surgical pathology Differential Diagnosis: Bx proven DYSPLASTIC JUNCTIONAL LENTIGINOUS NEVUS WITH SEVERE ATYPIA Tagged at inf tip Check Margins: yes Pink bx site [phone]   Return in about 1 week (around 03/13/2023) for Suture Removal.  Graciella Belton, RMA, am acting as scribe for Forest Gleason, MD .  Documentation: I have reviewed the above documentation for accuracy and completeness, and I agree with the above.  Alfonso Patten, MD

## 2023-03-07 ENCOUNTER — Telehealth: Payer: Self-pay

## 2023-03-07 NOTE — Telephone Encounter (Signed)
Patient doing ok but having some discomfort following yesterday's surgery. She took Tylenol today and changed the bandage. She will page Dr. Laurence Ferrari should she need anything over the weekend. Lurlean Horns., RMA

## 2023-03-07 NOTE — Telephone Encounter (Signed)
Please recommend she taken ibuprofen as well if she can tolerate it. She can also use an OTC lidocaine patch over the area overnight tonight. Thank you!

## 2023-03-07 NOTE — Telephone Encounter (Signed)
Patient advised recommendation for ibuprofen and/or OTC lidocaine patch. Also sent to her via [place].  Lurlean Horns., RMA

## 2023-03-13 ENCOUNTER — Telehealth: Payer: Self-pay

## 2023-03-13 ENCOUNTER — Ambulatory Visit (INDEPENDENT_AMBULATORY_CARE_PROVIDER_SITE_OTHER): Payer: Medicare Other | Admitting: Dermatology

## 2023-03-13 VITALS — BP 167/81 | HR 62

## 2023-03-13 DIAGNOSIS — Z4802 Encounter for removal of sutures: Secondary | ICD-10-CM

## 2023-03-13 MED ORDER — DOXYCYCLINE MONOHYDRATE 100 MG PO CAPS: ORAL_CAPSULE | ORAL | 0 refills | Status: DC

## 2023-03-13 NOTE — Progress Notes (Signed)
   Follow-Up Visit   Subjective  Kristina Carlson is a 87 y.o. female who presents for the following: Suture removal  Pathology showed a margins free severely dysplastic nevus at the R chest.   The following portions of the chart were reviewed this encounter and updated as appropriate: medications, allergies, medical history  Review of Systems:  No other skin or systemic complaints except as noted in HPI or Assessment and Plan.  Objective  Well appearing patient in no apparent distress; mood and affect are within normal limits.  Areas Examined: The face and chest     Assessment & Plan    Encounter for Removal of Sutures - Incision site is clean, dry and intact.  - Wound cleansed, sutures removed, wound cleansed and steri strips applied.  - Discussed pathology results showing a margins free dysplastic nevus - Patient advised to keep steri-strips dry until they fall off. - Scars remodel for a full year. - Once steri-strips fall off, patient can apply over-the-counter silicone scar cream once to twice a day to help with scar remodeling if desired. - Patient advised to call with any concerns or if they notice any new or changing lesions.  Given tenderness will start doxycycline.  Take doxycycline 100 mg tablet by mouth twice a day for 7 days. #14, 0 refills  Doxycycline should be taken with food to prevent nausea. Do not lay down for 30 minutes after taking. Be cautious with sun exposure and use good sun protection while on this medication. Pregnant women should not take this medication.   Return in 6 months (on 09/13/2023) for TBSE.   I, Rudell Cobb CMA, AMAA, scribed for Alfonso Patten, MD.  Documentation: I have reviewed the above documentation for accuracy and completeness, and I agree with the above.  Forest Gleason, MD

## 2023-03-13 NOTE — Telephone Encounter (Signed)
-----   Message from Alfonso Patten, MD sent at 03/12/2023  2:33 PM EDT ----- Entire lesion appears to be out. No additional treatment needed at this time. Please call our office [phone] with any questions.   MAs please call. Thank you!

## 2023-03-13 NOTE — Telephone Encounter (Signed)
-----   Message from Alfonso Patten, MD sent at 03/12/2023  2:33 PM EDT ----- Entire lesion appears to be out. No additional treatment needed at this time. Please call our office [phone] with any questions.   MAs please call. Thank you!

## 2023-03-13 NOTE — Telephone Encounter (Signed)
Patient advised margins clear. Lurlean Horns., RMA

## 2023-03-13 NOTE — Progress Notes (Deleted)
   Follow-Up Visit   Subjective  Kristina Carlson is a 87 y.o. female who presents for the following: Suture removal  Pathology showed a margins free severely dysplastic nevus  The following portions of the chart were reviewed this encounter and updated as appropriate: medications, allergies, medical history  Review of Systems:  No other skin or systemic complaints except as noted in HPI or Assessment and Plan.  Objective  Well appearing patient in no apparent distress; mood and affect are within normal limits.  Areas Examined: The face and chest     Assessment & Plan      Encounter for Removal of Sutures - Incision site is clean, dry and intact. - Wound cleansed, sutures removed, wound cleansed and steri strips applied.  - Discussed pathology results showing a margins free severely dysplastic nevus - Patient advised to keep steri-strips dry until they fall off. - Scars remodel for a full year. - Once steri-strips fall off, patient can apply over-the-counter silicone scar cream once to twice a day to help with scar remodeling if desired. - Patient advised to call with any concerns or if they notice any new or changing lesions.  Return for appointment as scheduled.  [place], AAMA

## 2023-03-13 NOTE — Patient Instructions (Signed)
Due to recent changes in healthcare laws, you may see results of your pathology and/or laboratory studies on MyChart before the doctors have had a chance to review them. We understand that in some cases there may be results that are confusing or concerning to you. Please understand that not all results are received at the same time and often the doctors may need to interpret multiple results in order to provide you with the best plan of care or course of treatment. Therefore, we ask that you please give us 2 business days to thoroughly review all your results before contacting the office for clarification. Should we see a critical lab result, you will be contacted sooner.   If You Need Anything After Your Visit  If you have any questions or concerns for your doctor, please call our main line at [phone] and press option 4 to reach your doctor's medical assistant. If no one answers, please leave a voicemail as directed and we will return your call as soon as possible. Messages left after 4 pm will be answered the following business day.   You may also send us a message via MyChart. We typically respond to MyChart messages within 1-2 business days.  For prescription refills, please ask your pharmacy to contact our office. Our fax number is [phone].  If you have an urgent issue when the clinic is closed that cannot wait until the next business day, you can page your doctor at the number below.    Please note that while we do our best to be available for urgent issues outside of office hours, we are not available 24/7.   If you have an urgent issue and are unable to reach us, you may choose to seek medical care at your doctor's office, retail clinic, urgent care center, or emergency room.  If you have a medical emergency, please immediately call 911 or go to the emergency department.  Pager Numbers  - Dr. Kowalski: [phone]  - Dr. Moye: [phone]  - Dr. Stewart:  [phone]  In the event of inclement weather, please call our main line at [phone] for an update on the status of any delays or closures.  Dermatology Medication Tips: Please keep the boxes that topical medications come in in order to help keep track of the instructions about where and how to use these. Pharmacies typically print the medication instructions only on the boxes and not directly on the medication tubes.   If your medication is too expensive, please contact our office at [phone] option 4 or send us a message through MyChart.   We are unable to tell what your co-pay for medications will be in advance as this is different depending on your insurance coverage. However, we may be able to find a substitute medication at lower cost or fill out paperwork to get insurance to cover a needed medication.   If a prior authorization is required to get your medication covered by your insurance company, please allow us 1-2 business days to complete this process.  Drug prices often vary depending on where the prescription is filled and some pharmacies may offer cheaper prices.  The website www.goodrx.com contains coupons for medications through different pharmacies. The prices here do not account for what the cost may be with help from insurance (it may be cheaper with your insurance), but the website can give you the price if you did not use any insurance.  - You can print the associated coupon and take it with   your prescription to the pharmacy.  - You may also stop by our office during regular business hours and pick up a GoodRx coupon card.  - If you need your prescription sent electronically to a different pharmacy, notify our office through [facility] MyChart or by phone at [phone] option 4.     Si Usted Necesita Algo Despus de Su Visita  Tambin puede enviarnos un mensaje a travs de MyChart. Por lo general respondemos a los mensajes de MyChart en el transcurso de 1 a 2  das hbiles.  Para renovar recetas, por favor pida a su farmacia que se ponga en contacto con nuestra oficina. Nuestro nmero de fax es el [phone].  Si tiene un asunto urgente cuando la clnica est cerrada y que no puede esperar hasta el siguiente da hbil, puede llamar/localizar a su doctor(a) al nmero que aparece a continuacin.   Por favor, tenga en cuenta que aunque hacemos todo lo posible para estar disponibles para asuntos urgentes fuera del horario de oficina, no estamos disponibles las 24 horas del da, los 7 das de la semana.   Si tiene un problema urgente y no puede comunicarse con nosotros, puede optar por buscar atencin mdica  en el consultorio de su doctor(a), en una clnica privada, en un centro de atencin urgente o en una sala de emergencias.  Si tiene una emergencia mdica, por favor llame inmediatamente al 911 o vaya a la sala de emergencias.  Nmeros de bper  - Dr. Kowalski: [phone]  - Dra. Moye: [phone]  - Dra. Stewart: [phone]  En caso de inclemencias del tiempo, por favor llame a nuestra lnea principal al [phone] para una actualizacin sobre el estado de cualquier retraso o cierre.  Consejos para la medicacin en dermatologa: Por favor, guarde las cajas en las que vienen los medicamentos de uso tpico para ayudarle a seguir las instrucciones sobre dnde y cmo usarlos. Las farmacias generalmente imprimen las instrucciones del medicamento slo en las cajas y no directamente en los tubos del medicamento.   Si su medicamento es muy caro, por favor, pngase en contacto con nuestra oficina llamando al [phone] y presione la opcin 4 o envenos un mensaje a travs de MyChart.   No podemos decirle cul ser su copago por los medicamentos por adelantado ya que esto es diferente dependiendo de la cobertura de su seguro. Sin embargo, es posible que podamos encontrar un medicamento sustituto a menor costo o llenar un formulario para que el  seguro cubra el medicamento que se considera necesario.   Si se requiere una autorizacin previa para que su compaa de seguros cubra su medicamento, por favor permtanos de 1 a 2 das hbiles para completar este proceso.  Los precios de los medicamentos varan con frecuencia dependiendo del lugar de dnde se surte la receta y alguna farmacias pueden ofrecer precios ms baratos.  El sitio web www.goodrx.com tiene cupones para medicamentos de diferentes farmacias. Los precios aqu no tienen en cuenta lo que podra costar con la ayuda del seguro (puede ser ms barato con su seguro), pero el sitio web puede darle el precio si no utiliz ningn seguro.  - Puede imprimir el cupn correspondiente y llevarlo con su receta a la farmacia.  - Tambin puede pasar por nuestra oficina durante el horario de atencin regular y recoger una tarjeta de cupones de GoodRx.  - Si necesita que su receta se enve electrnicamente a una farmacia diferente, informe a nuestra oficina a travs de MyChart de [facility]   o por telfono llamando al [phone] y presione la opcin 4.  

## 2023-03-26 DIAGNOSIS — H0288B Meibomian gland dysfunction left eye, upper and lower eyelids: Secondary | ICD-10-CM | POA: Diagnosis not present

## 2023-03-26 DIAGNOSIS — H353133 Nonexudative age-related macular degeneration, bilateral, advanced atrophic without subfoveal involvement: Secondary | ICD-10-CM | POA: Diagnosis not present

## 2023-03-26 DIAGNOSIS — Z9841 Cataract extraction status, right eye: Secondary | ICD-10-CM | POA: Diagnosis not present

## 2023-03-26 DIAGNOSIS — H52223 Regular astigmatism, bilateral: Secondary | ICD-10-CM | POA: Diagnosis not present

## 2023-03-26 DIAGNOSIS — Z9842 Cataract extraction status, left eye: Secondary | ICD-10-CM | POA: Diagnosis not present

## 2023-03-26 DIAGNOSIS — H0288A Meibomian gland dysfunction right eye, upper and lower eyelids: Secondary | ICD-10-CM | POA: Diagnosis not present

## 2023-03-28 ENCOUNTER — Other Ambulatory Visit: Payer: Self-pay | Admitting: Family Medicine

## 2023-03-28 DIAGNOSIS — E538 Deficiency of other specified B group vitamins: Secondary | ICD-10-CM

## 2023-04-18 ENCOUNTER — Encounter: Payer: Self-pay | Admitting: Cardiology

## 2023-04-18 ENCOUNTER — Ambulatory Visit: Payer: Medicare Other | Attending: Cardiology | Admitting: Cardiology

## 2023-04-18 VITALS — BP 128/64 | HR 64 | Ht 63.0 in | Wt 163.2 lb

## 2023-04-18 DIAGNOSIS — I10 Essential (primary) hypertension: Secondary | ICD-10-CM | POA: Diagnosis not present

## 2023-04-18 DIAGNOSIS — M25552 Pain in left hip: Secondary | ICD-10-CM | POA: Diagnosis not present

## 2023-04-18 DIAGNOSIS — E785 Hyperlipidemia, unspecified: Secondary | ICD-10-CM | POA: Diagnosis not present

## 2023-04-18 DIAGNOSIS — I25119 Atherosclerotic heart disease of native coronary artery with unspecified angina pectoris: Secondary | ICD-10-CM | POA: Diagnosis not present

## 2023-04-18 NOTE — Progress Notes (Signed)
Primary Care Provider: Excell Seltzer, MD [facility] Cardiologist: None Electrophysiologist: None  Referring Provider: Dr. Hannah Beat  [facility] Note: Chief Complaint  Patient presents with   Follow-up    2 month f/u no complaints today. Meds reviewed verbally with pt.   Coronary Artery Disease    Or after starting Imdur and Toprol.  Heart rate is better.  Much less frequent chest discomfort and shortness of breath.   ===================================  ASSESSMENT/PLAN   Problem List Items Addressed This Visit       Cardiology Problems   Hyperlipidemia LDL goal <70 (Chronic)    Most recent LDL was 81 in January. She should be due for labs to be checked soon by PCP.  If not below 70 in follow-up, would probably consider switching to rosuvastatin 40 mg daily and potentially consider adding either Zetia/Nexletol or combination Nexlizet      Essential hypertension (Chronic)    Blood pressure well-controlled after having added Imdur and Toprol to the losartan.  Also notable is that her sinus tachycardia has now resolved with addition of beta-blocker.  Plan: Continue losartan 50 mg daily Toprol 25 mg daily and Imdur 30 mg daily.      Coronary artery disease involving native coronary artery with angina pectoris (HCC) - Primary (Chronic)    Clearly, her anginal equivalent is the burning across the chest mostly when lying down at night.  This is associated also with episodes of PND. With initial efforts medical management will be started Toprol and Imdur.  Since doing so, her symptoms have notably improved.  Still having some, but much less frequent.  Not having to use nitroglycerin.  Plan for now will be to continue medical management with aspirin and statin along with Imdur and Toprol at current doses.  She is on losartan 50 mg daily as well.  If she does have episodes of chest comfort or dyspnea at night, I want her  to increase her Imdur dose to 2 tablets for several days to see if this helps.  If that helps, then we will simply increase the dose.  For now we will continue to monitor and if her symptoms are well-controlled, no plans for invasive therapy.      Relevant Orders   EKG 12-Lead (Completed)     Other   Left hip pain    Left hip pain is really keeping her from being all that active.  From her dyspnea is related to musculoskeletal pain in the effort to walk.  As such, she is somewhat sedentary limiting the opportunities for exertional angina.      ===================================  HPI:    Kristina Carlson is a 87 y.o. female with PMH notable for Essential Hypertension, HLD, hypothyroidism and Single Vessel Obstructive CAD (Ostial RCA) who is being seen today for 75-month follow-up at the request of Excell Seltzer, MD and Hannah Beat, MD.  [name] was seen on 02/14/2023 as a follow-up from January 18 visit to evaluate chest pain.  She was having intermittent episodes of diffuse chest pain about a month or so.  We evaluated this with a coronary CTA which showed severe ostial RCA disease (70 to 99%-FFR ct 0.76) with a Coronary Calcium Score 384.  She is being quite sedentary, not doing all that much.  Has baseline exertional dyspnea but was also noting having some episodes where she would wake up try to get her breath.  She would also have a burning sensation across her chest that occurs at night associated with dyspnea.  Not every night but just but every night.  No palpitations.  She really noted the back of her chest, usually happens at night when she would have another cause.  She is happy that the results of the CT scan could explain it. => My interpretation of her symptoms with this is probably consistent with angina decubitus with associated PND.  We talked about treatment options with medical management versus attempting somewhat complex PCI of ostial RCA.  We discussed pros and cons of  both with acknowledgment that options [facility] medical management will be the stable regardless of whether invasive procedures were pursued or not.  She was not in favor of invasive procedures and wanted to give a trial run of medical therapy, with the understanding that if symptoms were to recur or get worse, she would be consider pursuing invasive procedures. Continued on aspirin and statin as well as losartan 50 mg daily, added Toprol 25 mg nightly along with 30 mg Imdur at dinnertime. Plan for 47-month follow-up to see how symptoms are going.  Recent Hospitalizations: None   Reviewed  CV studies:    The following studies were reviewed today: (if available, images/films reviewed: From Epic Chart or Care Everywhere) No new studies  Interval History:   Kristina Carlson presents here today for follow-up evaluation for her chest discomfort-> described as a burning across chest - occurs @ night.. She actually says that she is not really having the burning sensation in her chest since starting the Toprol and the Imdur.  The nocturnal chest discomfort has become much less prominent almost nonexistent.  She is actually been more active but she has had pain in the left hip that is kept her from doing all that much activity. She is also not noticing the PND symptoms of waking up to try to catch her breath.  She still has a little bit of the dyspnea but nowhere near where she had before.  And the burning sensation is not there.  She feels happy with the results of initial medical management.  We again reviewed her anatomy and reviewed her options.  Based on initial results, she wants to continue to pursue noninvasive medical management.  CV Review of Symptoms (Summary): Cardiovascular ROS: positive for - chest pain, paroxysmal nocturnal dyspnea, and much less prominent nocturnal chest discomfort/burning across the chest.  Also much less prominent PND symptoms.  negative for - dyspnea on exertion, edema,  irregular heartbeat, orthopnea, palpitations, rapid heart rate, shortness of breath, or syncope/near syncope or TIA/amaurosis fugax, claudication  REVIEWED OF SYSTEMS   Review of Systems  Constitutional:  Positive for malaise/fatigue (Sedentary, does not really do much.). Negative for weight loss.  HENT:  Negative for congestion.   Respiratory:  Negative for cough and shortness of breath (She has some exertional dyspnea due to deconditioning and being sedentary.  Also because of the leg pain.).   Gastrointestinal:  Positive for heartburn. Negative for abdominal pain, blood in stool and melena.  Genitourinary:  Negative for hematuria.  Musculoskeletal:  Positive for back pain and joint pain (Left leg-hip pain limits activity.  Radiates down to the foot.). Negative for falls and myalgias.  Neurological:  Positive for tingling (Sounds like she is having radiculopathy down the right leg.). Negative for dizziness, focal weakness and weakness.  Endo/Heme/Allergies:  Does not bruise/bleed easily.  Psychiatric/Behavioral:  Positive for memory loss (Some, but  not significant). Negative for depression. The patient is nervous/anxious. The patient does not have insomnia.   All other systems reviewed and are negative.  I have reviewed and (if needed) personally updated the patient's problem list, medications, allergies, past medical and surgical history, social and family history.   PAST MEDICAL HISTORY   Past Medical History:  Diagnosis Date   Coronary artery disease involving native coronary artery with angina pectoris (HCC) 06/2014   CARDIAC CATH 06/2014: Mild LAD 50%, OM 1 30%, 50% ostial RCA:;; Coronary CTA February 2024 shows severe ostial RCA disease (FFR ct +0.76).  Mild proximal LAD and LCx stenosis.   Dysplastic nevus 01/24/2023   Right chest. Severe atypia, deep margin involved. Excised 03/06/23   Hyperlipidemia    Hypertension    Hypothyroid    Insomnia    Macular degeneration    -- Dr.  Minerva Areola (optometrist).  Taking Ocuvite.    PAST SURGICAL HISTORY   Past Surgical History:  Procedure Laterality Date   Cardiac CTA  01/25/2023   Coronary Calcium Score 384.  Severe (70 to 99%) ostial RCA=> FFR ct 0.76 (significant).  Mild proximal LAD and LCx stenosis (25 to 49%).   LEFT HEART CATHETERIZATION WITH CORONARY ANGIOGRAM N/A 06/29/2014   Procedure: LEFT HEART CATHETERIZATION WITH CORONARY ANGIOGRAM;  Surgeon: Lesleigh Noe, MD;  Location[facility] CATH LAB;  Service: Cardiovascular;: Mild LAD 50%, OM 1 30%, 50% ostial RCA:   CARDIAC CATH 06/2014: Mild LAD 50%, OM 1 30%, 50% ostial RCA: Coronary CTA January 25, 2023: Coronary Calcium Score 384.  Severe (70 to 99%) ostial RCA=> FFR ct 0.76 (significant).  Mild proximal LAD and LCx stenosis (25 to 49%).   Immunization History  Administered Date(s) Administered   Fluad Quad(high Dose 65+) 08/29/2019, 09/22/2020, 09/19/2021, 09/28/2022   Influenza Whole 11/12/2007, 09/17/2008, 10/14/2009, 10/25/2010, 09/05/2011   Influenza, High Dose Seasonal PF 08/20/2016, 08/30/2017, 08/30/2017, 09/17/2018   Influenza-Unspecified 08/24/2013, 09/22/2014, 09/17/2018   PFIZER(Purple Top)SARS-COV-2 Vaccination 02/29/2020, 03/22/2020, 10/13/2020   Pfizer Covid-19 Vaccine Bivalent Booster 37yrs & up 12/26/2021   Pneumococcal Conjugate-13 06/22/2015, 02/17/2018, 02/17/2018   Pneumococcal Polysaccharide-23 11/23/2004   Tdap 09/05/2011, 02/28/2018, 03/06/2018   Zoster Recombinat (Shingrix) 05/26/2018, 07/26/2018   Zoster, Live 01/09/2018    MEDICATIONS/ALLERGIES   Current Meds  Medication Sig   aspirin 81 MG EC tablet TAKE 1 TABLET (81 MG TOTAL) BY MOUTH DAILY. SWALLOW WHOLE.   atorvastatin (LIPITOR) 40 MG tablet Take 1 tablet (40 mg total) by mouth daily.   Calcium Carb-Cholecalciferol (CALCIUM 600+D3) 600-200 MG-UNIT TABS Take 1 tablet by mouth daily.    [employer]S VITAMIN B12 1000 MCG tablet TAKE 1 TABLET BY MOUTH EVERY DAY   famotidine (PEPCID)  20 MG tablet TAKE 1 TABLET BY MOUTH TWICE A DAY   FIBER PO Take by mouth daily. Takes gummies   levothyroxine (SYNTHROID) 75 MCG tablet Take 0.5 tablets (37.5 mcg total) by mouth daily.   losartan (COZAAR) 50 MG tablet TAKE 1 TABLET BY MOUTH EVERY DAY   Multiple Vitamins-Minerals (OCUVITE PRESERVISION) TABS Take 1 tablet by mouth 2 (two) times a day.   ondansetron (ZOFRAN) 4 MG tablet Take 4 mg by mouth every 8 (eight) hours as needed.   pantoprazole (PROTONIX) 40 MG tablet Take 1 tablet (40 mg total) by mouth daily.   tobramycin (TOBREX) 0.3 % ophthalmic solution SMARTSIG:In Eye(s)    Allergies  Allergen Reactions   Lisinopril Cough    SOCIAL HISTORY/FAMILY HISTORY   Reviewed in Epic:  Social History   Tobacco Use   Smoking status: Never   Smokeless tobacco: Never  Vaping Use   Vaping Use: Never used  Substance Use Topics   Alcohol use: No   Drug use: No   Social History   Social History Narrative   01/25/21   From: the area   Living: alone, has family nearby   Work: retired - Public librarian       Family: 1 child - Tammy Sours - 2 grandchildren and 2 great grandchildren      Enjoys: spend time with friends, reading      Exercise: not currently   Diet: pretty good, healthy foods, 3 meals a day      Safety   Seat belts: Yes    Guns: No   Safe in relationships: Yes    Family History  Problem Relation Age of Onset   Stroke Father    Diabetes Brother    Lymphoma Brother    Breast cancer Cousin        maternal cousin    OBJCTIVE -PE, EKG, labs   Wt Readings from Last 3 Encounters:  04/18/23 163 lb 4 oz (74 kg)  02/28/23 162 lb 4 oz (73.6 kg)  02/14/23 160 lb 2 oz (72.6 kg)    Physical Exam: BP 128/64 (BP Location: Left Arm, Patient Position: Sitting, Cuff Size: Normal)   Pulse 64   Ht 5\' 3"  (1.6 m)   Wt 163 lb 4 oz (74 kg)   SpO2 96%   BMI 28.92 kg/m  Physical Exam Vitals reviewed.  Constitutional:      General: She is not in acute distress.     Appearance: Normal appearance. She is normal weight. She is not ill-appearing (Well-nourished, well-groomed.  Healthy-appearing.) or toxic-appearing.  HENT:     Head: Normocephalic and atraumatic.  Neck:     Vascular: No carotid bruit, hepatojugular reflux or JVD.  Cardiovascular:     Rate and Rhythm: Normal rate and regular rhythm.     Chest Wall: PMI is not displaced.     Pulses: Normal pulses.     Heart sounds: S1 normal and S2 normal. Heart sounds are distant. No murmur heard.    No friction rub. No gallop.  Pulmonary:     Effort: Pulmonary effort is normal. No respiratory distress.     Breath sounds: Normal breath sounds. No wheezing, rhonchi or rales.  Chest:     Chest wall: No tenderness.  Musculoskeletal:        General: No swelling. Normal range of motion.     Cervical back: Normal range of motion and neck supple.  Skin:    General: Skin is warm and dry.  Neurological:     General: No focal deficit present.     Mental Status: She is alert and oriented to person, place, and time. Mental status is at baseline.  Psychiatric:        Mood and Affect: Mood normal.        Behavior: Behavior normal.        Thought Content: Thought content normal.        Judgment: Judgment normal.     Adult ECG Report  Rate: 64;  Rhythm: normal sinus rhythm and low voltage.  Normal axis, intervals and durations.. ;   Narrative Interpretation: .  Stable.  Recent Labs: Reviewed. Lab Results  Component Value Date   CHOL 148 01/07/2023   HDL 43.50 01/07/2023   LDLCALC 81 01/07/2023  LDLDIRECT 84.0 06/06/2021   TRIG 116.0 01/07/2023   CHOLHDL 3 01/07/2023   Lab Results  Component Value Date   CREATININE 0.91 01/10/2023   BUN 14 01/10/2023   NA 140 01/10/2023   K 4.1 01/10/2023   CL 105 01/10/2023   CO2 28 01/10/2023      Latest Ref Rng & Units 06/06/2021   11:53 AM 04/15/2019    1:38 PM 06/19/2018   10:30 AM  CBC  WBC 4.0 - 10.5 K/uL 4.0  4.5  3.5   Hemoglobin 12.0 - 15.0 g/dL  16.1  09.6  04.5   Hematocrit 36.0 - 46.0 % 41.6  44.4  43.1   Platelets 150.0 - 400.0 K/uL 204.0  206  240     Lab Results  Component Value Date   HGBA1C 5.5 10/03/2016   Lab Results  Component Value Date   TSH 11.13 (H) 02/06/2023    ================================================== I spent a total of 17 minutes with the patient spent in direct patient consultation.  We spent a long time going through the results of her study and discussing treatment options. Additional time spent with chart review  / charting (studies, outside notes, etc): 16 min  Total Time: 33 min  Current medicines are reviewed at length with the patient today.  (+/- concerns) N/A  Notice: This dictation was prepared with Dragon dictation along with smart phrase technology. Any transcriptional errors that result from this process are unintentional and may not be corrected upon review.   Studies Ordered:  Orders Placed This Encounter  Procedures   EKG 12-Lead   No orders of the defined types were placed in this encounter.   Patient Instructions / Medication Changes & Studies & Tests Ordered   Patient Instructions  Medication Instructions:   Your physician recommends that you continue on your current medications as directed. Please refer to the Current Medication list given to you today.  *If you need a refill on your cardiac medications before your next appointment, please call your pharmacy*   Lab Work:  None Ordered  If you have labs (blood work) drawn today and your tests are completely normal, you will receive your results only by: MyChart Message (if you have MyChart) OR A paper copy in the mail If you have any lab test that is abnormal or we need to change your treatment, we will call you to review the results.   Testing/Procedures:  None Ordered    Follow-Up: At [facility], you and your health needs are our priority.  As part of our continuing mission to provide you  with exceptional heart care, we have created designated Provider Care Teams.  These Care Teams include your primary Cardiologist (physician) and Advanced Practice Providers (APPs -  Physician Assistants and Nurse Practitioners) who all work together to provide you with the care you need, when you need it.  We recommend signing up for the patient portal called "MyChart".  Sign up information is provided on this After Visit Summary.  MyChart is used to connect with patients for Virtual Visits (Telemedicine).  Patients are able to view lab/test results, encounter notes, upcoming appointments, etc.  Non-urgent messages can be sent to your provider as well.   To learn more about what you can do with MyChart, go to [email or web address].    Your next appointment:   4 month(s) - PA or NP  8 months - Dr. Herbie Baltimore  Other Instructions  If you continue to have chest pain try  to increase the dose of your imdur to 2 tablets daily - if that is helping please call us and let us know and we will send in an updated prescription.       Marykay Lex, MD, MS Bryan Lemma, M.D., M.S. Interventional Cardiologist  [facility]   [address]; [address] [place], [place]  [address] [phone]           Fax [phone]    Thank you for choosing [facility] in [place]!!

## 2023-04-18 NOTE — Patient Instructions (Addendum)
Medication Instructions:   Your physician recommends that you continue on your current medications as directed. Please refer to the Current Medication list given to you today.  *If you need a refill on your cardiac medications before your next appointment, please call your pharmacy*   Lab Work:  None Ordered  If you have labs (blood work) drawn today and your tests are completely normal, you will receive your results only by: MyChart Message (if you have MyChart) OR A paper copy in the mail If you have any lab test that is abnormal or we need to change your treatment, we will call you to review the results.   Testing/Procedures:  None Ordered    Follow-Up: At [facility], you and your health needs are our priority.  As part of our continuing mission to provide you with exceptional heart care, we have created designated Provider Care Teams.  These Care Teams include your primary Cardiologist (physician) and Advanced Practice Providers (APPs -  Physician Assistants and Nurse Practitioners) who all work together to provide you with the care you need, when you need it.  We recommend signing up for the patient portal called "MyChart".  Sign up information is provided on this After Visit Summary.  MyChart is used to connect with patients for Virtual Visits (Telemedicine).  Patients are able to view lab/test results, encounter notes, upcoming appointments, etc.  Non-urgent messages can be sent to your provider as well.   To learn more about what you can do with MyChart, go to [email or web address].    Your next appointment:   4 month(s) - PA or NP  8 months - Dr. Herbie Baltimore  Other Instructions  If you continue to have chest pain try to increase the dose of your imdur to 2 tablets daily - if that is helping please call us and let us know and we will send in an updated prescription.

## 2023-04-23 DIAGNOSIS — H353124 Nonexudative age-related macular degeneration, left eye, advanced atrophic with subfoveal involvement: Secondary | ICD-10-CM | POA: Diagnosis not present

## 2023-04-23 DIAGNOSIS — H35363 Drusen (degenerative) of macula, bilateral: Secondary | ICD-10-CM | POA: Diagnosis not present

## 2023-04-23 DIAGNOSIS — H353114 Nonexudative age-related macular degeneration, right eye, advanced atrophic with subfoveal involvement: Secondary | ICD-10-CM | POA: Diagnosis not present

## 2023-04-23 DIAGNOSIS — H353134 Nonexudative age-related macular degeneration, bilateral, advanced atrophic with subfoveal involvement: Secondary | ICD-10-CM | POA: Diagnosis not present

## 2023-04-23 DIAGNOSIS — H43813 Vitreous degeneration, bilateral: Secondary | ICD-10-CM | POA: Diagnosis not present

## 2023-04-28 ENCOUNTER — Encounter: Payer: Self-pay | Admitting: Cardiology

## 2023-04-28 NOTE — Assessment & Plan Note (Signed)
Clearly, her anginal equivalent is the burning across the chest mostly when lying down at night.  This is associated also with episodes of PND. With initial efforts medical management will be started Toprol and Imdur.  Since doing so, her symptoms have notably improved.  Still having some, but much less frequent.  Not having to use nitroglycerin.  Plan for now will be to continue medical management with aspirin and statin along with Imdur and Toprol at current doses.  She is on losartan 50 mg daily as well.  If she does have episodes of chest comfort or dyspnea at night, I want her to increase her Imdur dose to 2 tablets for several days to see if this helps.  If that helps, then we will simply increase the dose.  For now we will continue to monitor and if her symptoms are well-controlled, no plans for invasive therapy.

## 2023-04-28 NOTE — Assessment & Plan Note (Signed)
Blood pressure well-controlled after having added Imdur and Toprol to the losartan.  Also notable is that her sinus tachycardia has now resolved with addition of beta-blocker.  Plan: Continue losartan 50 mg daily Toprol 25 mg daily and Imdur 30 mg daily.

## 2023-04-28 NOTE — Assessment & Plan Note (Signed)
Most recent LDL was 81 in January. She should be due for labs to be checked soon by PCP.  If not below 70 in follow-up, would probably consider switching to rosuvastatin 40 mg daily and potentially consider adding either Zetia/Nexletol or combination Nexlizet

## 2023-04-28 NOTE — Assessment & Plan Note (Signed)
Left hip pain is really keeping her from being all that active.  From her dyspnea is related to musculoskeletal pain in the effort to walk.  As such, she is somewhat sedentary limiting the opportunities for exertional angina.

## 2023-05-02 ENCOUNTER — Telehealth: Payer: Self-pay | Admitting: Cardiology

## 2023-05-02 NOTE — Telephone Encounter (Signed)
Patient is calling stating she has a red rash across her chest that itches. She reports she showed this to Dr. Herbie Baltimore at her appt in April and now believes it has to be one of the medications he prescribed causing it. Please advise. P cv div bu

## 2023-05-02 NOTE — Telephone Encounter (Signed)
Hesitant to d/c metoprolol or Imdur due to CAD and patient having no CP or SOB. Recommend following up with PCP to see if rash could be from different causes

## 2023-05-02 NOTE — Telephone Encounter (Signed)
Would try holding Imdur x 2 weeks to see if any change -- this is easiest to hold.  If no improvement - then we will need her to wean off Toprol -- take 1/2 tab daily x 1 week, then stop for 2 weeks & reassess.    Restart Imdur when stopping Toprol.    Bryan Lemma, MD

## 2023-05-02 NOTE — Telephone Encounter (Signed)
Notified the patient of the pharmacist recommendation to follow up her PCP for further evaluation of the rash. Patient verbalized understanding and states that she will contact her PCP.

## 2023-05-02 NOTE — Telephone Encounter (Signed)
Called and spoke with patient on the phone. Patient says that she started taking Metoprolol Succinate 25 daily mg and Isosorbide 30 mg daily in February. Patient states that she has developed a rash across her chest with itching. Patient states that she is unsure when the rash started but it was after starting this two new medications and that she thinks that these medications are causing the rash.    Will send to Dr. Herbie Baltimore and Pharmacy for review.

## 2023-05-03 ENCOUNTER — Ambulatory Visit (INDEPENDENT_AMBULATORY_CARE_PROVIDER_SITE_OTHER): Payer: Medicare Other | Admitting: Family Medicine

## 2023-05-03 ENCOUNTER — Encounter: Payer: Self-pay | Admitting: Family Medicine

## 2023-05-03 VITALS — BP 140/70 | HR 80 | Temp 97.8°F | Ht 63.0 in | Wt 162.4 lb

## 2023-05-03 DIAGNOSIS — L239 Allergic contact dermatitis, unspecified cause: Secondary | ICD-10-CM | POA: Insufficient documentation

## 2023-05-03 MED ORDER — ONDANSETRON HCL 4 MG PO TABS: 4.0000 mg | ORAL_TABLET | Freq: Three times a day (TID) | ORAL | 1 refills | Status: DC | PRN

## 2023-05-03 MED ORDER — TRIAMCINOLONE ACETONIDE 0.5 % EX CREA: 1.0000 | TOPICAL_CREAM | Freq: Two times a day (BID) | CUTANEOUS | 0 refills | Status: DC

## 2023-05-03 NOTE — Patient Instructions (Addendum)
Apply  topical steroid cream twice daily for 2 weeks to rash.  Call if not improving as expected versus follow-up with dermatology. Call sooner if oral swelling such as tongue or lip swelling.  Go to the emergency room if shortness of breath or severe oral swelling.

## 2023-05-03 NOTE — Telephone Encounter (Signed)
Pt stated she saw her primary care today and was told the rash doesn't look like it's related to medication. She stated was prescribed some ointment and recommended by pcp to contact their office in 2 weeks if symptoms did not resolve.  Pt stated she would prefer to stay on her medication for now and follow up in two weeks.

## 2023-05-03 NOTE — Assessment & Plan Note (Signed)
Acute, no clear suggestion of drug reaction rash from new blood pressure medications.  Rash is very focal on upper chest likely more due to chlorhexidine prep from neoplasm excision versus irritant dermatitis from being on doxycycline and sun exposure. Recommend triamcinolone 0.5 mg cream twice daily x 2 weeks.  She will call if she is not improving as expected or possibly follow-up with dermatology.  Return and ER precautions provided.

## 2023-05-03 NOTE — Progress Notes (Signed)
Patient ID: Kristina Carlson, female    DOB: [birth year], 87 y.o.   MRN: [id number]  This visit was conducted in person.  BP (!) 140/70 (BP Location: Left Arm, Patient Position: Sitting, Cuff Size: Normal)   Pulse 80   Temp 97.8 F (36.6 C) (Temporal)   Ht 5\' 3"  (1.6 m)   Wt 162 lb 6 oz (73.7 kg)   SpO2 98%   BMI 28.76 kg/m    CC:  Chief Complaint  Patient presents with   Rash    On chest    Subjective:   HPI: Kristina Carlson is a 87 y.o. female with history of hypertension, coronary artery disease, hypothyroidism and IBS presenting on 05/03/2023 for Rash (On chest)   New onset rash on chest in last 1-2 months.  She noted bumpy rash on upper chest, itchy.  Spread up to neck.    Cardiology 02/14/23  Med  addition of metoprolol ER 25 mg daily and isosorbide.. has helped with chest discomfort.  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 [facility].  Prep was chlorhexidine.  Path lentiginous nevus with severe atypia.  03/13/23  sutures out... treated with doxy for bacterial prevention.  She took prednisone in April for let hip. 4/10.Marland Kitchen did not help.     04/18/2023 saw cardiology for follow up   Has been applying hydrocortisone.. helps with itch.   No tongue swelling, tongue swelling or SOB.  No fever. Rash is not any where else , no on palms or mucosa.  Has has a week of  diarrhea... Has history of IBS.  Requesting prescription for Zofran to use as needed for intermittent nausea.  The symptoms do not seem to be connected to her current rash.  Relevant past medical, surgical, family and social history reviewed and updated as indicated. Interim medical history since our last visit reviewed. Allergies and medications reviewed and updated. Outpatient Medications Prior to Visit  Medication Sig Dispense Refill   aspirin 81 MG EC tablet TAKE 1 TABLET (81 MG TOTAL) BY MOUTH DAILY. SWALLOW WHOLE. 90 tablet 3   atorvastatin (LIPITOR) 40 MG tablet Take 1  tablet (40 mg total) by mouth daily. 90 tablet 1   Calcium Carb-Cholecalciferol (CALCIUM 600+D3) 600-200 MG-UNIT TABS Take 1 tablet by mouth daily.      Cholecalciferol (VITAMIN D3) 1000 units CAPS Take 1 Capful by mouth daily.     cyanocobalamin ([employer] VITAMIN B12) 1000 MCG tablet TAKE 1 TABLET BY MOUTH EVERY DAY 100 tablet 2   FIBER PO Take by mouth daily. Takes gummies     isosorbide mononitrate (IMDUR) 30 MG 24 hr tablet Take 1 tablet (30 mg total) by mouth at bedtime. 90 tablet 3   levothyroxine (SYNTHROID) 75 MCG tablet Take 0.5 tablets (37.5 mcg total) by mouth daily. 45 tablet 3   losartan (COZAAR) 50 MG tablet TAKE 1 TABLET BY MOUTH EVERY DAY 90 tablet 3   metoprolol succinate (TOPROL XL) 25 MG 24 hr tablet Take 1 tablet (25 mg total) by mouth at bedtime. 90 tablet 3   Multiple Vitamins-Minerals (OCUVITE PRESERVISION) TABS Take 1 tablet by mouth 2 (two) times a day.     pantoprazole (PROTONIX) 40 MG tablet Take 1 tablet (40 mg total) by mouth daily. 30 tablet 3   tobramycin (TOBREX) 0.3 % ophthalmic solution SMARTSIG:In Eye(s)     ondansetron (ZOFRAN) 4 MG tablet Take 4 mg by mouth every 8 (eight) hours as needed.  No facility-administered medications prior to visit.     Per HPI unless specifically indicated in ROS section below Review of Systems  Skin:  Positive for rash.   Objective:  BP (!) 140/70 (BP Location: Left Arm, Patient Position: Sitting, Cuff Size: Normal)   Pulse 80   Temp 97.8 F (36.6 C) (Temporal)   Ht 5\' 3"  (1.6 m)   Wt 162 lb 6 oz (73.7 kg)   SpO2 98%   BMI 28.76 kg/m   Wt Readings from Last 3 Encounters:  05/03/23 162 lb 6 oz (73.7 kg)  04/18/23 163 lb 4 oz (74 kg)  02/28/23 162 lb 4 oz (73.6 kg)      Physical Exam Constitutional:      General: She is not in acute distress.    Appearance: Normal appearance. She is well-developed. She is not ill-appearing or toxic-appearing.  HENT:     Head: Normocephalic.     Right Ear: Hearing, tympanic  membrane, ear canal and external ear normal. Tympanic membrane is not erythematous, retracted or bulging.     Left Ear: Hearing, tympanic membrane, ear canal and external ear normal. Tympanic membrane is not erythematous, retracted or bulging.     Nose: No mucosal edema or rhinorrhea.     Right Sinus: No maxillary sinus tenderness or frontal sinus tenderness.     Left Sinus: No maxillary sinus tenderness or frontal sinus tenderness.     Mouth/Throat:     Pharynx: Uvula midline.  Eyes:     General: Lids are normal. Lids are everted, no foreign bodies appreciated.     Conjunctiva/sclera: Conjunctivae normal.     Pupils: Pupils are equal, round, and reactive to light.  Neck:     Thyroid: No thyroid mass or thyromegaly.     Vascular: No carotid bruit.     Trachea: Trachea normal.  Cardiovascular:     Rate and Rhythm: Normal rate and regular rhythm.     Pulses: Normal pulses.     Heart sounds: Normal heart sounds, S1 normal and S2 normal. No murmur heard.    No friction rub. No gallop.  Pulmonary:     Effort: Pulmonary effort is normal. No tachypnea or respiratory distress.     Breath sounds: Normal breath sounds. No decreased breath sounds, wheezing, rhonchi or rales.  Abdominal:     General: Bowel sounds are normal.     Palpations: Abdomen is soft.     Tenderness: There is no abdominal tenderness.  Musculoskeletal:     Cervical back: Normal range of motion and neck supple.  Skin:    General: Skin is warm and dry.     Findings: No rash.  Neurological:     Mental Status: She is alert.  Psychiatric:        Mood and Affect: Mood is not anxious or depressed.        Speech: Speech normal.        Behavior: Behavior normal. Behavior is cooperative.        Thought Content: Thought content normal.        Judgment: Judgment normal.     Rash: Upper chest, bumpy erythematous papules/macules.  Right upper chest healing excision site with associated skin changes. Results for orders placed or  performed in visit on 02/06/23  TSH  Result Value Ref Range   TSH 11.13 (H) 0.35 - 5.50 uIU/mL  T3, free  Result Value Ref Range   T3, Free 2.4 2.3 - 4.2 pg/mL  T4,  free  Result Value Ref Range   Free T4 0.60 0.60 - 1.60 ng/dL    Assessment and Plan  Allergic dermatitis Assessment & Plan: Acute, no clear suggestion of drug reaction rash from new blood pressure medications.  Rash is very focal on upper chest likely more due to chlorhexidine prep from neoplasm excision versus irritant dermatitis from being on doxycycline and sun exposure. Recommend triamcinolone 0.5 mg cream twice daily x 2 weeks.  She will call if she is not improving as expected or possibly follow-up with dermatology.  Return and ER precautions provided.   Other orders -     Triamcinolone Acetonide; Apply 1 Application topically 2 (two) times daily.  Dispense: 30 g; Refill: 0 -     Ondansetron HCl; Take 1 tablet (4 mg total) by mouth every 8 (eight) hours as needed.  Dispense: 20 tablet; Refill: 1    Return if symptoms worsen or fail to improve.   Kerby Nora, MD

## 2023-05-06 ENCOUNTER — Encounter: Payer: Self-pay | Admitting: Obstetrics & Gynecology

## 2023-05-06 ENCOUNTER — Ambulatory Visit: Payer: Medicare Other | Admitting: Obstetrics & Gynecology

## 2023-05-06 VITALS — BP 134/77 | HR 68

## 2023-05-06 DIAGNOSIS — N8110 Cystocele, unspecified: Secondary | ICD-10-CM | POA: Diagnosis not present

## 2023-05-06 DIAGNOSIS — Z4689 Encounter for fitting and adjustment of other specified devices: Secondary | ICD-10-CM | POA: Diagnosis not present

## 2023-05-06 NOTE — Progress Notes (Addendum)
 GYNECOLOGY OFFICE VISIT NOTE  History:   Kristina Carlson is a 87 y.o. [phone] female with stage II pelvic organ prolapse who presents for pessary maintenance.  She is followed by Dr. Marilynne (Urogynecology) and has been using a size #4 ring with support pessary. The pessary has been working well, no complaints. She has pessary maintenance here as she has difficulties getting to [place], this [facility] office is closer to her.  She denies any abnormal vaginal discharge, bleeding, pelvic pain or other concerns.    Past Medical History:  Diagnosis Date   Coronary artery disease involving native coronary artery with angina pectoris (HCC) 06/2014   CARDIAC CATH 06/2014: Mild LAD 50%, OM 1 30%, 50% ostial RCA:;; Coronary CTA February 2024 shows severe ostial RCA disease (FFR ct +0.76).  Mild proximal LAD and LCx stenosis.   Dysplastic nevus 01/24/2023   Right chest. Severe atypia, deep margin involved. Excised 03/06/23   Hyperlipidemia    Hypertension    Hypothyroid    Insomnia    Macular degeneration    -- Dr. Rudell Rue (optometrist).  Taking Ocuvite.    Past Surgical History:  Procedure Laterality Date   Cardiac CTA  01/25/2023   Coronary Calcium  Score 384.  Severe (70 to 99%) ostial RCA=> FFR ct 0.76 (significant).  Mild proximal LAD and LCx stenosis (25 to 49%).   LEFT HEART CATHETERIZATION WITH CORONARY ANGIOGRAM N/A 06/29/2014   Procedure: LEFT HEART CATHETERIZATION WITH CORONARY ANGIOGRAM;  Surgeon: Victory LELON Claudene DOUGLAS, MD;  Location: [facility] CATH LAB;  Service: Cardiovascular;: Mild LAD 50%, OM 1 30%, 50% ostial RCA:   The following portions of the patient's history were reviewed and updated as appropriate: allergies, current medications, past family history, past medical history, past social history, past surgical history and problem list.   Review of Systems:  Pertinent items noted in HPI and remainder of comprehensive ROS otherwise negative.  Physical Exam:  BP  134/77   Pulse 68  CONSTITUTIONAL: Well-developed, well-nourished female in no acute distress.  MUSCULOSKELETAL: Normal range of motion. No edema noted. NEUROLOGIC: Alert and oriented to person, place, and time. Normal muscle tone coordination. No cranial nerve deficit noted. PSYCHIATRIC: Normal mood and affect. Normal behavior. Normal judgment and thought content. CARDIOVASCULAR: Normal heart rate noted RESPIRATORY: Effort and breath sounds normal, no problems with respiration noted ABDOMEN: No masses noted. No other overt distention noted.   PELVIC: Normal external female genitalia with moderate atrophy; Urethral meatus normal in appearance, no urethral masses or discharge. The pessary was noted to be in place. It was removed and cleaned. Exam revealed no lesions in the vagina. The pessary was replaced. It was comfortable to the patient and fit well.  Examination done in presence of chaperone     Assessment and Plan:     1. Stage II Pelvic Organ Prolapse 2. Pessary maintenance - Continue size #4 ring with support pessary - Follow up 3 months for pessary check  Routine preventative health maintenance measures emphasized, follow up with PCP regarding other medical issues. Please refer to After Visit Summary for other counseling recommendations.   Return in about 3 months (around 08/06/2023) for Pessary maintenance.    I spent 20 minutes dedicated to the care of this patient including pre-visit review of records, face to face time with the patient discussing her conditions and treatments and post visit orders.   GLORIS HUGGER, MD, FACOG Obstetrician & Gynecologist, [facility] for [employer], [facility]

## 2023-06-04 NOTE — Progress Notes (Signed)
Unnamed Hino T. Shemaiah Round, MD, CAQ Sports Medicine [facility] [address] [place], [address]  Phone: [phone]  FAX: [phone]  ERSIE KUNTZ - 87 y.o. female  MRN [id number]  Date of Birth: [birth year]  Date: 06/05/2023  PCP: Excell Seltzer, MD  Referral: Excell Seltzer, MD  Chief Complaint  Patient presents with   Hip Pain    Last Injection and Prednisone Rx hasn't helped   Leg Pain    Left Thigh Area   Subjective:   Kristina Carlson is a 87 y.o. very pleasant female patient with Body mass index is 29.12 kg/m. who presents with the following:  Patient presents with hip and leg pain.  I previously saw her for some left hip pain after fall and a trochanteric bursitis flareup in the past.  Has to sleep on her left side.  She has some on the anterior thigh and occ down the leg.    Really the point of maximal tenderness is at the left lateral thigh and at the trochanteric bursa on the left.  This is focally tender, and the area of maximal tenderness.  She also has some significant tenderness just caudal to the femur at the insertion site of the proximal femur above the trochanteric bursa.  This is also an area of maximal tenderness for the patient.  She also is complaining of some low back pain right now.  She does have some mild to modest tenderness in the lowest part of her lumbar spine and some pain referred to the posterior buttocks to a lesser degree compared to the above aforementioned pains.  Back pain has been there for only about 2 weeks. Only then, posterior buttocks, too.   Does not drive all that much.     Review of Systems is noted in the HPI, as appropriate  Objective:   BP 138/74 (BP Location: Right Arm, Patient Position: Sitting, Cuff Size: Large)   Pulse (!) 56   Temp 97.6 F (36.4 C) (Temporal)   Ht 5\' 3"  (1.6 m)   Wt 164 lb 6 oz (74.6 kg)   SpO2 96%   BMI 29.12 kg/m   GEN: No acute distress;  alert,appropriate. PULM: Breathing comfortably in no respiratory distress PSYCH: Normally interactive.    HIP EXAM: SIDE: L ROM: Abduction, Flexion, Internal and External range of motion: Approaching full range of motion Pain with terminal IROM and EROM: None GTB: Decidedly tender at the GTB and decidedly tender above near the gluteus medius and minimus insertion SLR: NEG Knees: No effusion FABER: NT REVERSE FABER: NT, neg Piriformis: NT at direct palpation Str: flexion: 4/5 abduction: 4/5 adduction: 4/5 Strength testing non-tender    Range of motion at  the waist: Flexion: normal Extension: normal Lateral bending: normal Rotation: all normal  No echymosis or edema Rises to examination table with no difficulty Gait: non antalgic  Inspection/Deformity: N Paraspinus Tenderness: Mild tenderness at L4-S1 bilaterally  B Ankle Dorsiflexion (L5,4): 5/5 B Great Toe Dorsiflexion (L5,4): 5/5 Heel Walk (L5): WNL Toe Walk (S1): WNL Rise/Squat (L4): WNL  SENSORY B Medial Foot (L4): WNL B Dorsum (L5): WNL B Lateral (S1): WNL Light Touch: WNL  REFLEXES Knee (L4): 2+ Ankle (S1): 2+  B SLR, seated: neg B SLR, supine: neg B FABER: neg B Reverse FABER: neg B Greater Troch: NT B Log Roll: neg B Sciatic Notch: NT   Laboratory and Imaging Data: CLINICAL DATA:  Pain after fall  EXAM: DG HIP (WITH OR WITHOUT PELVIS) 2-3V LEFT   COMPARISON:  None Available.   FINDINGS: Mild degenerative changes in the hips without loss of joint space. No fracture or dislocation. No bony lesions. Degenerative changes in the lower lumbar spine, incompletely evaluated.   IMPRESSION: Mild degenerative changes in the hips without loss of joint space. Degenerative changes in the lower lumbar spine, incompletely evaluated. No fracture or dislocation.     Electronically Signed   By: Gerome Sam III M.D.   On: 02/16/2023 16:34  CT of the abdomen and pelvis was reviewed by myself  independently, date of service is September 29, 2021.  Patient does have diffuse multilevel degenerative disc disease and spondyloarthropathy.  The greatest degenerative disc disease is at L4-5 and L5-S1, and I would characterize this as moderate to severe.  Electronically Signed  By: Hannah Beat, MD On: 06/05/2023 10:40 AM EDT   Assessment and Plan:     ICD-10-CM   1. Trochanteric bursitis of left hip  M70.62 triamcinolone acetonide (KENALOG-40) injection 80 mg    2. Tendinopathy of left gluteus medius  M67.952 triamcinolone acetonide (KENALOG-40) injection 80 mg    3. DDD (degenerative disc disease), lumbosacral  M51.37      Recurrent trochanteric bursitis of the left hip.  She also has some very significant tenderness just caudal to the trochanteric bursa in the region of the insertion of the gluteus medius and minimus.  Both this and the GTB are equally tender on palpation.  Pain in the low back pain is relatively modest, and while this does impact her right now, it is short-lived, and compared to the pain at the hip it is relatively minor.  Social: Right now this is limiting her ability to maximally walk including for exercise  We also reviewed hip rehab. Patient Instructions  Hip Rehab:  Hip Flexion: Toe up to ceiling, laying on your back. Lift your whole leg, 3 sets. Work up to being able to do #30 with each set.  Hip elevations, Toe and leg turned out to side.  Lift whole leg, 3 sets. Work up to being able to do #30 with each set.  Hip Abductions: Lying on side, straight out to side. 3 sets, work up to being able to do #30 with each set.  At the beginning you may only be able to do a lot less, try to do #10.    Aspiration/Injection Procedure Note GRETNA ROTHERMICH 1936/12/19 Date of procedure: 06/05/2023  Procedure: Large Joint Aspiration / Injection of Hip, Trochanteric Bursa, L Indications: Pain  Procedure Details Verbal consent obtained. Risks, benefits, and alternatives  reviewed. Greater trochanter sterilely prepped with Chloraprep. Ethyl Chloride used for anesthesia. 9 cc of Lidocaine 1% injected with 1 mL of Kenalog 40 mg into trochanteric bursa at area of maximal tenderness at greater trochanter. Needle taken to bone to troch bursa, flows easily. Bursa massaged. No bleeding and no complications. Decreased pain after injection.  Additionally, just caudal to the trochanteric bursa in the region of the insertion of the gluteus medius and minimus, 3 different points of insertion were used to inject the insertion at the top of the femur of the gluteus medius.  This was tolerated well and without any kind of complaint with decreased symptoms after injection.  I used an additional 40 mg of Kenalog for this insertional injection. Needle: 22 gauge spinal needle Medication: 1 mL of Kenalog 40 mg   Medication Management during today's office visit: Meds ordered  this encounter  Medications   triamcinolone acetonide (KENALOG-40) injection 80 mg   There are no discontinued medications.  Orders placed today for conditions managed today: No orders of the defined types were placed in this encounter.   Disposition: No follow-ups on file.  [employer] One speech-to-text software was used for transcription in this dictation.  Possible transcriptional errors can occur using Animal nutritionist.   Signed,  Elpidio Galea. Joven Mom, MD   Outpatient Encounter Medications as of 06/05/2023  Medication Sig   aspirin 81 MG EC tablet TAKE 1 TABLET (81 MG TOTAL) BY MOUTH DAILY. SWALLOW WHOLE.   atorvastatin (LIPITOR) 40 MG tablet Take 1 tablet (40 mg total) by mouth daily.   Calcium Carb-Cholecalciferol (CALCIUM 600+D3) 600-200 MG-UNIT TABS Take 1 tablet by mouth daily.    Cholecalciferol (VITAMIN D3) 1000 units CAPS Take 1 Capful by mouth daily.   cyanocobalamin ([employer] VITAMIN B12) 1000 MCG tablet TAKE 1 TABLET BY MOUTH EVERY DAY   FIBER PO Take by mouth daily. Takes gummies    isosorbide mononitrate (IMDUR) 30 MG 24 hr tablet Take 1 tablet (30 mg total) by mouth at bedtime.   levothyroxine (SYNTHROID) 75 MCG tablet Take 0.5 tablets (37.5 mcg total) by mouth daily.   losartan (COZAAR) 50 MG tablet TAKE 1 TABLET BY MOUTH EVERY DAY   metoprolol succinate (TOPROL XL) 25 MG 24 hr tablet Take 1 tablet (25 mg total) by mouth at bedtime.   Multiple Vitamins-Minerals (OCUVITE PRESERVISION) TABS Take 1 tablet by mouth 2 (two) times a day.   ondansetron (ZOFRAN) 4 MG tablet Take 1 tablet (4 mg total) by mouth every 8 (eight) hours as needed.   pantoprazole (PROTONIX) 40 MG tablet Take 1 tablet (40 mg total) by mouth daily.   tobramycin (TOBREX) 0.3 % ophthalmic solution SMARTSIG:In Eye(s)   triamcinolone cream (KENALOG) 0.5 % Apply 1 Application topically 2 (two) times daily.   [EXPIRED] triamcinolone acetonide (KENALOG-40) injection 80 mg    No facility-administered encounter medications on file as of 06/05/2023.

## 2023-06-05 ENCOUNTER — Ambulatory Visit (INDEPENDENT_AMBULATORY_CARE_PROVIDER_SITE_OTHER): Payer: Medicare Other | Admitting: Family Medicine

## 2023-06-05 ENCOUNTER — Encounter: Payer: Self-pay | Admitting: Family Medicine

## 2023-06-05 VITALS — BP 138/74 | HR 56 | Temp 97.6°F | Ht 63.0 in | Wt 164.4 lb

## 2023-06-05 DIAGNOSIS — M67952 Unspecified disorder of synovium and tendon, left thigh: Secondary | ICD-10-CM

## 2023-06-05 DIAGNOSIS — M5137 Other intervertebral disc degeneration, lumbosacral region: Secondary | ICD-10-CM | POA: Diagnosis not present

## 2023-06-05 DIAGNOSIS — M7062 Trochanteric bursitis, left hip: Secondary | ICD-10-CM

## 2023-06-05 MED ORDER — TRIAMCINOLONE ACETONIDE 40 MG/ML IJ SUSP
80.0000 mg | Freq: Once | INTRAMUSCULAR | Status: AC
  Administered 2023-06-05: 80 mg via INTRA_ARTICULAR

## 2023-06-05 NOTE — Patient Instructions (Signed)
Hip Rehab:  Hip Flexion: Toe up to ceiling, laying on your back. Lift your whole leg, 3 sets. Work up to being able to do #30 with each set.  Hip elevations, Toe and leg turned out to side.  Lift whole leg, 3 sets. Work up to being able to do #30 with each set.  Hip Abductions: Lying on side, straight out to side. 3 sets, work up to being able to do #30 with each set.  At the beginning you may only be able to do a lot less, try to do #10.  

## 2023-06-13 ENCOUNTER — Ambulatory Visit (INDEPENDENT_AMBULATORY_CARE_PROVIDER_SITE_OTHER): Payer: Medicare Other

## 2023-06-13 VITALS — Ht 63.0 in | Wt 162.0 lb

## 2023-06-13 DIAGNOSIS — Z0000 Encounter for general adult medical examination without abnormal findings: Secondary | ICD-10-CM | POA: Diagnosis not present

## 2023-06-13 NOTE — Patient Instructions (Signed)
[name] , Thank you for taking time to come for your Medicare Wellness Visit. I appreciate your ongoing commitment to your health goals. Please review the following plan we discussed and let me know if I can assist you in the future.   These are the goals we discussed:  Goals      Exercise 3x per week (30 min per time)     Walk around the perimeter of your home or outside with the nicer weather with a friend or loved one.      Patient Stated     06/08/2021, I will maintain and continue medications as prescribed.      Patient Stated     No new goals        This is a list of the screening recommended for you and due dates:  Health Maintenance  Topic Date Due   COVID-19 Vaccine (5 - 2023-24 season) 08/24/2022   Flu Shot  07/25/2023   Medicare Annual Wellness Visit  06/12/2024   DTaP/Tdap/Td vaccine (4 - Td or Tdap) 03/06/2028   Pneumonia Vaccine  Completed   DEXA scan (bone density measurement)  Completed   Zoster (Shingles) Vaccine  Completed   HPV Vaccine  Aged Out    Advanced directives: Please bring a copy of your health care power of attorney and living will to the office to be added to your chart at your convenience.   Conditions/risks identified: Aim for 30 minutes of exercise or brisk walking, 6-8 glasses of water, and 5 servings of fruits and vegetables each day.   Next appointment: Follow up in one year for your annual wellness visit 06/15/24 @ 3PM telephone   Preventive Care 65 Years and Older, Female Preventive care refers to lifestyle choices and visits with your health care provider that can promote health and wellness. What does preventive care include? A yearly physical exam. This is also called an annual well check. Dental exams once or twice a year. Routine eye exams. Ask your health care provider how often you should have your eyes checked. Personal lifestyle choices, including: Daily care of your teeth and gums. Regular physical activity. Eating a  healthy diet. Avoiding tobacco and drug use. Limiting alcohol use. Practicing safe sex. Taking low-dose aspirin every day. Taking vitamin and mineral supplements as recommended by your health care provider. What happens during an annual well check? The services and screenings done by your health care provider during your annual well check will depend on your age, overall health, lifestyle risk factors, and family history of disease. Counseling  Your health care provider may ask you questions about your: Alcohol use. Tobacco use. Drug use. Emotional well-being. Home and relationship well-being. Sexual activity. Eating habits. History of falls. Memory and ability to understand (cognition). Work and work Astronomer. Reproductive health. Screening  You may have the following tests or measurements: Height, weight, and BMI. Blood pressure. Lipid and cholesterol levels. These may be checked every 5 years, or more frequently if you are over 42 years old. Skin check. Lung cancer screening. You may have this screening every year starting at age 42 if you have a 30-pack-year history of smoking and currently smoke or have quit within the past 15 years. Fecal occult blood test (FOBT) of the stool. You may have this test every year starting at age 7. Flexible sigmoidoscopy or colonoscopy. You may have a sigmoidoscopy every 5 years or a colonoscopy every 10 years starting at age 89. Hepatitis C blood test. Hepatitis  B blood test. Sexually transmitted disease (STD) testing. Diabetes screening. This is done by checking your blood sugar (glucose) after you have not eaten for a while (fasting). You may have this done every 1-3 years. Bone density scan. This is done to screen for osteoporosis. You may have this done starting at age 38. Mammogram. This may be done every 1-2 years. Talk to your health care provider about how often you should have regular mammograms. Talk with your health care  provider about your test results, treatment options, and if necessary, the need for more tests. Vaccines  Your health care provider may recommend certain vaccines, such as: Influenza vaccine. This is recommended every year. Tetanus, diphtheria, and acellular pertussis (Tdap, Td) vaccine. You may need a Td booster every 10 years. Zoster vaccine. You may need this after age 3. Pneumococcal 13-valent conjugate (PCV13) vaccine. One dose is recommended after age 32. Pneumococcal polysaccharide (PPSV23) vaccine. One dose is recommended after age 41. Talk to your health care provider about which screenings and vaccines you need and how often you need them. This information is not intended to replace advice given to you by your health care provider. Make sure you discuss any questions you have with your health care provider. Document Released: 01/06/2016 Document Revised: 08/29/2016 Document Reviewed: 10/11/2015 Elsevier Interactive Patient Education  2017 [place] Prevention in the Home Falls can cause injuries. They can happen to people of all ages. There are many things you can do to make your home safe and to help prevent falls. What can I do on the outside of my home? Regularly fix the edges of walkways and driveways and fix any cracks. Remove anything that might make you trip as you walk through a door, such as a raised step or threshold. Trim any bushes or trees on the path to your home. Use bright outdoor lighting. Clear any walking paths of anything that might make someone trip, such as rocks or tools. Regularly check to see if handrails are loose or broken. Make sure that both sides of any steps have handrails. Any raised decks and porches should have guardrails on the edges. Have any leaves, snow, or ice cleared regularly. Use sand or salt on walking paths during winter. Clean up any spills in your garage right away. This includes oil or grease spills. What can I do in the  bathroom? Use night lights. Install grab bars by the toilet and in the tub and shower. Do not use towel bars as grab bars. Use non-skid mats or decals in the tub or shower. If you need to sit down in the shower, use a plastic, non-slip stool. Keep the floor dry. Clean up any water that spills on the floor as soon as it happens. Remove soap buildup in the tub or shower regularly. Attach bath mats securely with double-sided non-slip rug tape. Do not have throw rugs and other things on the floor that can make you trip. What can I do in the bedroom? Use night lights. Make sure that you have a light by your bed that is easy to reach. Do not use any sheets or blankets that are too big for your bed. They should not hang down onto the floor. Have a firm chair that has side arms. You can use this for support while you get dressed. Do not have throw rugs and other things on the floor that can make you trip. What can I do in the kitchen? Clean up any  spills right away. Avoid walking on wet floors. Keep items that you use a lot in easy-to-reach places. If you need to reach something above you, use a strong step stool that has a grab bar. Keep electrical cords out of the way. Do not use floor polish or wax that makes floors slippery. If you must use wax, use non-skid floor wax. Do not have throw rugs and other things on the floor that can make you trip. What can I do with my stairs? Do not leave any items on the stairs. Make sure that there are handrails on both sides of the stairs and use them. Fix handrails that are broken or loose. Make sure that handrails are as long as the stairways. Check any carpeting to make sure that it is firmly attached to the stairs. Fix any carpet that is loose or worn. Avoid having throw rugs at the top or bottom of the stairs. If you do have throw rugs, attach them to the floor with carpet tape. Make sure that you have a light switch at the top of the stairs and the  bottom of the stairs. If you do not have them, ask someone to add them for you. What else can I do to help prevent falls? Wear shoes that: Do not have high heels. Have rubber bottoms. Are comfortable and fit you well. Are closed at the toe. Do not wear sandals. If you use a stepladder: Make sure that it is fully opened. Do not climb a closed stepladder. Make sure that both sides of the stepladder are locked into place. Ask someone to hold it for you, if possible. Clearly mark and make sure that you can see: Any grab bars or handrails. First and last steps. Where the edge of each step is. Use tools that help you move around (mobility aids) if they are needed. These include: Canes. Walkers. Scooters. Crutches. Turn on the lights when you go into a dark area. Replace any light bulbs as soon as they burn out. Set up your furniture so you have a clear path. Avoid moving your furniture around. If any of your floors are uneven, fix them. If there are any pets around you, be aware of where they are. Review your medicines with your doctor. Some medicines can make you feel dizzy. This can increase your chance of falling. Ask your doctor what other things that you can do to help prevent falls. This information is not intended to replace advice given to you by your health care provider. Make sure you discuss any questions you have with your health care provider. Document Released: 10/06/2009 Document Revised: 05/17/2016 Document Reviewed: 01/14/2015 Elsevier Interactive Patient Education  2017 [employer].

## 2023-06-13 NOTE — Progress Notes (Signed)
Subjective:   Kristina Carlson is a 87 y.o. female who presents for Medicare Annual (Subsequent) preventive examination.  Visit Complete: Virtual  I connected with  [name] on 06/13/23 by a audio enabled telemedicine application and verified that I am speaking with the correct person using two identifiers.  Patient Location: Home  Provider Location: Home Office  I discussed the limitations of evaluation and management by telemedicine. The patient expressed understanding and agreed to proceed.   Review of Systems      Cardiac Risk Factors include: advanced age (>43men, >91 women);hypertension;dyslipidemia;sedentary lifestyle     Objective:    Today's Vitals   06/13/23 1255 06/13/23 1256  Weight: 162 lb (73.5 kg)   Height: 5\' 3"  (1.6 m)   PainSc:  0-No pain   Body mass index is 28.7 kg/m.     06/13/2023    1:04 PM 06/11/2022    1:26 PM 06/08/2021    1:17 PM 03/30/2020   11:15 AM 10/15/2019    1:44 PM 06/08/2019    3:49 PM 04/15/2019   11:52 AM  Advanced Directives  Does Patient Have a Medical Advance Directive? Yes Yes Yes Yes No No Yes  Type of Estate agent of [place];Living will Healthcare Power of [place];Living will Healthcare Power of [place];Living will Living will;Healthcare Power of [employer] Power of [place];Living will  Does patient want to make changes to medical advance directive?       No - Patient declined  Copy of Healthcare Power of Attorney in Chart? No - copy requested No - copy requested No - copy requested No - copy requested     Would patient like information on creating a medical advance directive?     No - Patient declined No - Patient declined     Current Medications (verified) Outpatient Encounter Medications as of 06/13/2023  Medication Sig   aspirin 81 MG EC tablet TAKE 1 TABLET (81 MG TOTAL) BY MOUTH DAILY. SWALLOW WHOLE.   atorvastatin (LIPITOR) 40 MG tablet Take 1 tablet (40 mg total) by mouth daily.    Calcium Carb-Cholecalciferol (CALCIUM 600+D3) 600-200 MG-UNIT TABS Take 1 tablet by mouth daily.    Cholecalciferol (VITAMIN D3) 1000 units CAPS Take 1 Capful by mouth daily.   cyanocobalamin ([employer] VITAMIN B12) 1000 MCG tablet TAKE 1 TABLET BY MOUTH EVERY DAY   FIBER PO Take by mouth daily. Takes gummies   isosorbide mononitrate (IMDUR) 30 MG 24 hr tablet Take 1 tablet (30 mg total) by mouth at bedtime.   levothyroxine (SYNTHROID) 75 MCG tablet Take 0.5 tablets (37.5 mcg total) by mouth daily.   losartan (COZAAR) 50 MG tablet TAKE 1 TABLET BY MOUTH EVERY DAY   metoprolol succinate (TOPROL XL) 25 MG 24 hr tablet Take 1 tablet (25 mg total) by mouth at bedtime.   Multiple Vitamins-Minerals (OCUVITE PRESERVISION) TABS Take 1 tablet by mouth 2 (two) times a day.   ondansetron (ZOFRAN) 4 MG tablet Take 1 tablet (4 mg total) by mouth every 8 (eight) hours as needed.   pantoprazole (PROTONIX) 40 MG tablet Take 1 tablet (40 mg total) by mouth daily.   tobramycin (TOBREX) 0.3 % ophthalmic solution SMARTSIG:In Eye(s)   triamcinolone cream (KENALOG) 0.5 % Apply 1 Application topically 2 (two) times daily.   No facility-administered encounter medications on file as of 06/13/2023.    Allergies (verified) Lisinopril   History: Past Medical History:  Diagnosis Date   Coronary artery disease involving native  coronary artery with angina pectoris (HCC) 06/2014   CARDIAC CATH 06/2014: Mild LAD 50%, OM 1 30%, 50% ostial RCA:;; Coronary CTA February 2024 shows severe ostial RCA disease (FFR ct +0.76).  Mild proximal LAD and LCx stenosis.   Dysplastic nevus 01/24/2023   Right chest. Severe atypia, deep margin involved. Excised 03/06/23   Hyperlipidemia    Hypertension    Hypothyroid    Insomnia    Macular degeneration    -- Dr. Minerva Areola (optometrist).  Taking Ocuvite.   Past Surgical History:  Procedure Laterality Date   Cardiac CTA  01/25/2023   Coronary Calcium Score 384.  Severe (70 to 99%) ostial  RCA=> FFR ct 0.76 (significant).  Mild proximal LAD and LCx stenosis (25 to 49%).   LEFT HEART CATHETERIZATION WITH CORONARY ANGIOGRAM N/A 06/29/2014   Procedure: LEFT HEART CATHETERIZATION WITH CORONARY ANGIOGRAM;  Surgeon: Lesleigh Noe, MD;  Location: [facility] CATH LAB;  Service: Cardiovascular;: Mild LAD 50%, OM 1 30%, 50% ostial RCA:   Family History  Problem Relation Age of Onset   Stroke Father    Diabetes Brother    Lymphoma Brother    Breast cancer Cousin        maternal cousin   Social History   Socioeconomic History   Marital status: Widowed    Spouse name: Not on file   Number of children: 1   Years of education: 12   Highest education level: High school graduate  Occupational History   Occupation: Retired   Tobacco Use   Smoking status: Never   Smokeless tobacco: Never  Vaping Use   Vaping Use: Never used  Substance and Sexual Activity   Alcohol use: No   Drug use: No   Sexual activity: Not Currently  Other Topics Concern   Not on file  Social History Narrative   01/25/21   From: the area   Living: alone, has family nearby   Work: retired - Public librarian       Family: 1 child - Kristina Carlson - 2 grandchildren and 2 great grandchildren      Enjoys: spend time with friends, reading      Exercise: not currently   Diet: pretty good, healthy foods, 3 meals a day      Safety   Seat belts: Yes    Guns: No   Safe in relationships: Yes    Social Determinants of Health   Financial Resource Strain: Low Risk  (06/13/2023)   Overall Financial Resource Strain (CARDIA)    Difficulty of Paying Living Expenses: Not hard at all  Food Insecurity: No Food Insecurity (06/13/2023)   Hunger Vital Sign    Worried About Running Out of Food in the Last Year: Never true    Ran Out of Food in the Last Year: Never true  Transportation Needs: No Transportation Needs (06/13/2023)   PRAPARE - Administrator, Civil Service (Medical): No    Lack of Transportation (Non-Medical):  No  Physical Activity: Insufficiently Active (06/13/2023)   Exercise Vital Sign    Days of Exercise per Week: 7 days    Minutes of Exercise per Session: 10 min  Stress: No Stress Concern Present (06/13/2023)   [employer] of Occupational Health - Occupational Stress Questionnaire    Feeling of Stress : Not at all  Social Connections: Moderately Integrated (06/13/2023)   Social Connection and Isolation Panel [NHANES]    Frequency of Communication with Friends and Family: More than three times  a week    Frequency of Social Gatherings with Friends and Family: More than three times a week    Attends Religious Services: More than 4 times per year    Active Member of Clubs or Organizations: Yes    Attends Banker Meetings: More than 4 times per year    Marital Status: Widowed    Tobacco Counseling Counseling given: Not Answered   Clinical Intake:  Pre-visit preparation completed: Yes  Pain : No/denies pain Pain Score: 0-No pain     Nutritional Risks: None Diabetes: No  How often do you need to have someone help you when you read instructions, pamphlets, or other written materials from your doctor or pharmacy?: 1 - Never  Interpreter Needed?: No  Information entered by :: C.Gautham Hewins LPN   Activities of Daily Living    06/13/2023    1:07 PM  In your present state of health, do you have any difficulty performing the following activities:  Hearing? 0  Comment has wax buildup  Vision? 1  Comment macular degeneration  Difficulty concentrating or making decisions? 0  Walking or climbing stairs? 0  Dressing or bathing? 0  Doing errands, shopping? 0  Preparing Food and eating ? N  Using the Toilet? N  In the past six months, have you accidently leaked urine? Y  Comment Followed by urologist  Do you have problems with loss of bowel control? N  Managing your Medications? N  Managing your Finances? N  Housekeeping or managing your Housekeeping? N     Patient Care Team: Excell Seltzer, MD as PCP - General (Family Medicine)  Indicate any recent Medical Services you may have received from other than [facility] providers in the past year (date may be approximate).     Assessment:   This is a routine wellness examination for Kendyl.  Hearing/Vision screen Hearing Screening - Comments:: No hearing issues, has some wax buildup. Has appt. 07/01/23 to get ear evaluated. Vision Screening - Comments:: Glasses - Dr.Sanders - UTD on exams. Gets injections for macular degeneration.  Dietary issues and exercise activities discussed:     Goals Addressed             This Visit's Progress    Patient Stated       No new goals       Depression Screen    06/13/2023    1:03 PM 02/14/2023    3:54 PM 06/11/2022    1:23 PM 06/08/2021    1:18 PM 01/25/2021   10:10 AM 05/09/2020    9:17 AM 03/30/2020   11:16 AM  PHQ 2/9 Scores  PHQ - 2 Score 0 0 0 0 0 0 0  PHQ- 9 Score  3  0       Fall Risk    06/13/2023    1:07 PM 02/14/2023    3:54 PM 02/06/2023    2:22 PM 06/11/2022    1:26 PM 06/08/2021    1:18 PM  Fall Risk   Falls in the past year? 0 1 0 0 0  Number falls in past yr: 0 0 0 0 0  Injury with Fall? 0 0 0 0 0  Risk for fall due to : No Fall Risks No Fall Risks No Fall Risks No Fall Risks Medication side effect  Follow up Falls prevention discussed;Falls evaluation completed Falls evaluation completed Falls evaluation completed Falls prevention discussed Falls evaluation completed;Falls prevention discussed    MEDICARE RISK AT HOME:  Medicare  Risk at Home - 06/13/23 1308     Any stairs in or around the home? Yes    If so, are there any without handrails? No    Home free of loose throw rugs in walkways, pet beds, electrical cords, etc? Yes    Adequate lighting in your home to reduce risk of falls? Yes    Life alert? No    Use of a cane, walker or w/c? No    Grab bars in the bathroom? No    Shower chair or bench in shower? Yes     Elevated toilet seat or a handicapped toilet? Yes                 Cognitive Function:    06/08/2021    1:22 PM  MMSE - Mini Mental State Exam  Not completed: Refused        06/13/2023    1:09 PM 06/11/2022    1:29 PM 03/30/2020   11:18 AM  6CIT Screen  What Year? 0 points 0 points 0 points  What month? 0 points 0 points 0 points  What time? 0 points 0 points 0 points  Count back from 20 0 points 0 points 0 points  Months in reverse 0 points 0 points 0 points  Repeat phrase 2 points 0 points 0 points  Total Score 2 points 0 points 0 points    Immunizations Immunization History  Administered Date(s) Administered   Fluad Quad(high Dose 65+) 08/29/2019, 09/22/2020, 09/19/2021, 09/28/2022   Influenza Whole 11/12/2007, 09/17/2008, 10/14/2009, 10/25/2010, 09/05/2011   Influenza, High Dose Seasonal PF 08/20/2016, 08/30/2017, 08/30/2017, 09/17/2018   Influenza-Unspecified 08/24/2013, 09/22/2014, 09/17/2018   PFIZER(Purple Top)SARS-COV-2 Vaccination 02/29/2020, 03/22/2020, 10/13/2020   Pfizer Covid-19 Vaccine Bivalent Booster 26yrs & up 12/26/2021   Pneumococcal Conjugate-13 06/22/2015, 02/17/2018, 02/17/2018   Pneumococcal Polysaccharide-23 11/23/2004   Tdap 09/05/2011, 02/28/2018, 03/06/2018   Zoster Recombinat (Shingrix) 05/26/2018, 07/26/2018   Zoster, Live 01/09/2018    TDAP status: Up to date  Flu Vaccine status: Up to date  Pneumococcal vaccine status: Up to date  Covid-19 vaccine status: Information provided on how to obtain vaccines.   Qualifies for Shingles Vaccine? Yes   Zostavax completed  unknown   Shingrix Completed?: Yes  Screening Tests Health Maintenance  Topic Date Due   COVID-19 Vaccine (5 - 2023-24 season) 08/24/2022   INFLUENZA VACCINE  07/25/2023   Medicare Annual Wellness (AWV)  06/12/2024   DTaP/Tdap/Td (4 - Td or Tdap) 03/06/2028   Pneumonia Vaccine 24+ Years old  Completed   DEXA SCAN  Completed   Zoster Vaccines- Shingrix  Completed    HPV VACCINES  Aged Out    Health Maintenance  Health Maintenance Due  Topic Date Due   COVID-19 Vaccine (5 - 2023-24 season) 08/24/2022    Colorectal cancer screening: No longer required.   Mammogram status: Completed 06/21/22. Repeat every year Pt declined .  Bone Scan - Declined  Lung Cancer Screening: (Low Dose CT Chest recommended if Age 18-80 years, 20 pack-year currently smoking OR have quit w/in 15years.) does not qualify.   Lung Cancer Screening Referral: no  Additional Screening:  Hepatitis C Screening: does not qualify; Completed N/A  Vision Screening: Recommended annual ophthalmology exams for early detection of glaucoma and other disorders of the eye. Is the patient up to date with their annual eye exam?  Yes  Who is the provider or what is the name of the office in which the patient attends annual eye exams?  Dr.Sander If pt is not established with a provider, would they like to be referred to a provider to establish care? Yes .   Dental Screening: Recommended annual dental exams for proper oral hygiene   Community Resource Referral / Chronic Care Management: CRR required this visit?  No   CCM required this visit?  No     Plan:     I have personally reviewed and noted the following in the patient's chart:   Medical and social history Use of alcohol, tobacco or illicit drugs  Current medications and supplements including opioid prescriptions. Patient is not currently taking opioid prescriptions. Functional ability and status Nutritional status Physical activity Advanced directives List of other physicians Hospitalizations, surgeries, and ER visits in previous 12 months Vitals Screenings to include cognitive, depression, and falls Referrals and appointments  In addition, I have reviewed and discussed with patient certain preventive protocols, quality metrics, and best practice recommendations. A written personalized care plan for preventive services  as well as general preventive health recommendations were provided to patient.     Maryan Puls, LPN   1/61/0960   After Visit Summary: (MyChart) Due to this being a telephonic visit, the after visit summary with patients personalized plan was offered to patient via MyChart   Nurse Notes: Pt declined mammogram and dexa scan.

## 2023-06-15 ENCOUNTER — Other Ambulatory Visit: Payer: Self-pay | Admitting: Family Medicine

## 2023-06-15 DIAGNOSIS — I2510 Atherosclerotic heart disease of native coronary artery without angina pectoris: Secondary | ICD-10-CM

## 2023-06-19 DIAGNOSIS — H43813 Vitreous degeneration, bilateral: Secondary | ICD-10-CM | POA: Diagnosis not present

## 2023-06-19 DIAGNOSIS — H353134 Nonexudative age-related macular degeneration, bilateral, advanced atrophic with subfoveal involvement: Secondary | ICD-10-CM | POA: Diagnosis not present

## 2023-06-19 DIAGNOSIS — H353114 Nonexudative age-related macular degeneration, right eye, advanced atrophic with subfoveal involvement: Secondary | ICD-10-CM | POA: Diagnosis not present

## 2023-06-19 DIAGNOSIS — H353124 Nonexudative age-related macular degeneration, left eye, advanced atrophic with subfoveal involvement: Secondary | ICD-10-CM | POA: Diagnosis not present

## 2023-06-28 ENCOUNTER — Telehealth: Payer: Self-pay | Admitting: *Deleted

## 2023-06-28 DIAGNOSIS — E782 Mixed hyperlipidemia: Secondary | ICD-10-CM

## 2023-06-28 NOTE — Telephone Encounter (Signed)
Free T3 and free T4 were normal at last check.  We do not need to repeat thyroid labs at this time.  I will ask her about symptoms at upcoming office visit.

## 2023-06-28 NOTE — Telephone Encounter (Signed)
-----   Message from Lovena Neighbours, RT sent at 06/28/2023  3:22 PM EDT ----- Regarding: Labs for 7.22.24 Please put physical lab orders in future. Thank you, Denny Peon

## 2023-06-28 NOTE — Telephone Encounter (Signed)
Do you want to repeat thyroid labs.  Dr. Patsy Lager checked them back in February and TSH was still abnormal.  I don't think medication was changed at that time.  Please advise.

## 2023-07-01 DIAGNOSIS — H6981 Other specified disorders of Eustachian tube, right ear: Secondary | ICD-10-CM | POA: Diagnosis not present

## 2023-07-15 ENCOUNTER — Other Ambulatory Visit (INDEPENDENT_AMBULATORY_CARE_PROVIDER_SITE_OTHER): Payer: Medicare Other

## 2023-07-15 DIAGNOSIS — E782 Mixed hyperlipidemia: Secondary | ICD-10-CM | POA: Diagnosis not present

## 2023-07-15 LAB — COMPREHENSIVE METABOLIC PANEL
ALT: 32 U/L (ref 0–35)
AST: 29 U/L (ref 0–37)
Albumin: 3.8 g/dL (ref 3.5–5.2)
Alkaline Phosphatase: 71 U/L (ref 39–117)
BUN: 17 mg/dL (ref 6–23)
CO2: 29 mEq/L (ref 19–32)
Calcium: 9.5 mg/dL (ref 8.4–10.5)
Chloride: 104 mEq/L (ref 96–112)
Creatinine, Ser: 1.01 mg/dL (ref 0.40–1.20)
GFR: 51 mL/min — ABNORMAL LOW (ref >60.00)
Glucose, Bld: 99 mg/dL (ref 70–99)
Potassium: 4.3 mEq/L (ref 3.5–5.1)
Sodium: 141 mEq/L (ref 135–145)
Total Bilirubin: 0.6 mg/dL (ref 0.2–1.2)
Total Protein: 6.3 g/dL (ref 6.0–8.3)

## 2023-07-15 LAB — LIPID PANEL
Cholesterol: 145 mg/dL (ref 0–200)
HDL: 48.5 mg/dL (ref >39.00)
LDL Cholesterol: 81 mg/dL (ref 0–99)
NonHDL: 96.65
Total CHOL/HDL Ratio: 3
Triglycerides: 76 mg/dL (ref 0.0–149.0)
VLDL: 15.2 mg/dL (ref 0.0–40.0)

## 2023-07-16 NOTE — Progress Notes (Signed)
No critical labs need to be addressed urgently. We will discuss labs in detail at upcoming office visit.   

## 2023-07-19 ENCOUNTER — Encounter: Payer: Self-pay | Admitting: Family Medicine

## 2023-07-19 ENCOUNTER — Ambulatory Visit (INDEPENDENT_AMBULATORY_CARE_PROVIDER_SITE_OTHER): Payer: Medicare Other | Admitting: Family Medicine

## 2023-07-19 VITALS — BP 120/72 | HR 58 | Temp 97.7°F | Ht 63.5 in | Wt 162.4 lb

## 2023-07-19 DIAGNOSIS — E039 Hypothyroidism, unspecified: Secondary | ICD-10-CM

## 2023-07-19 DIAGNOSIS — I25119 Atherosclerotic heart disease of native coronary artery with unspecified angina pectoris: Secondary | ICD-10-CM | POA: Diagnosis not present

## 2023-07-19 DIAGNOSIS — Z0000 Encounter for general adult medical examination without abnormal findings: Secondary | ICD-10-CM | POA: Diagnosis not present

## 2023-07-19 DIAGNOSIS — E785 Hyperlipidemia, unspecified: Secondary | ICD-10-CM | POA: Diagnosis not present

## 2023-07-19 DIAGNOSIS — I10 Essential (primary) hypertension: Secondary | ICD-10-CM | POA: Diagnosis not present

## 2023-07-19 MED ORDER — TRIAMCINOLONE ACETONIDE 0.5 % EX CREA: 1.0000 | TOPICAL_CREAM | Freq: Two times a day (BID) | CUTANEOUS | 0 refills | Status: DC

## 2023-07-19 MED ORDER — ROSUVASTATIN CALCIUM 40 MG PO TABS: 40.0000 mg | ORAL_TABLET | Freq: Every day | ORAL | 3 refills | Status: DC

## 2023-07-19 NOTE — Patient Instructions (Signed)
Stop atorvastatin and starting Crestor 40 mg .  Keep follow up with Cardiology and dermatology.

## 2023-07-19 NOTE — Assessment & Plan Note (Signed)
Stable, chronic.  Continue current medication.   Continue losartan 50 mg daily Toprol 25 mg daily and Imdur 30 mg daily.

## 2023-07-19 NOTE — Assessment & Plan Note (Addendum)
LDL not at goal < 70... cardiology had recommended if this was the case to change to crestor 40 mg daily.  She is willing to make this change.  She has follow up with cardiology next month.

## 2023-07-19 NOTE — Progress Notes (Signed)
Patient ID: Kristina Carlson, female    DOB: [birth year], 87 y.o.   MRN: [id number]  This visit was conducted in person.  BP 120/72 (BP Location: Right Arm, Patient Position: Sitting, Cuff Size: Normal)   Pulse (!) 58   Temp 97.7 F (36.5 C) (Temporal)   Ht 5' 3.5" (1.613 m)   Wt 162 lb 6 oz (73.7 kg)   SpO2 97%   BMI 28.31 kg/m    CC:  Chief Complaint  Patient presents with   Annual Exam    Part 2 ([facility] 06/13/2023)    Subjective:   HPI: Kristina Carlson is a 87 y.o. female presenting on 07/19/2023 for Annual Exam (Part 2 ([facility] 06/13/2023))  The patient presents for annual medicare wellness, complete physical and review of chronic health problems. He/She also has the following acute concerns today:  The patient saw a LPN or RN for medicare wellness visit. 06/13/23  Prevention and wellness was reviewed in detail. Note reviewed and important notes copied below.    Hypothyroid :Restarted levo  37.5 mg daily in 1.2024 TSH elevated but free T3 and T4 nml on 01/2023 She feels well.. no cold intolerance, no fatigue, good energy, no swelling... she wishes to continue current dose for now. Lab Results  Component Value Date   TSH 11.13 (H) 02/06/2023     CAD, medical managment followed  Dr Herbie Baltimore  Hypertension:   No well controlled on losartan 100 mg daily, Toprol 25 mg daily and Imdur 30 mg daily.  Has follow up next month with cardiology. BP Readings from Last 3 Encounters:  07/19/23 120/72  06/05/23 138/74  05/06/23 134/77    Elevated Cholesterol:  on atorvastatin 40 mg daily.  Reviewed last Cards note 04/2023 Dr. Herbie Baltimore : LDL not at goal < 70... cardiology had recommended if this was the case to change to crestor Lab Results  Component Value Date   CHOL 145 07/15/2023   HDL 48.50 07/15/2023   LDLCALC 81 07/15/2023   LDLDIRECT 84.0 06/06/2021   TRIG 76.0 07/15/2023   CHOLHDL 3 07/15/2023  Using medications without problems: Muscle aches:  Diet compliance: heart  healthy Exercise:minimal Other complaints:     Relevant past medical, surgical, family and social history reviewed and updated as indicated. Interim medical history since our last visit reviewed. Allergies and medications reviewed and updated. Outpatient Medications Prior to Visit  Medication Sig Dispense Refill   aspirin 81 MG EC tablet TAKE 1 TABLET (81 MG TOTAL) BY MOUTH DAILY. SWALLOW WHOLE. 90 tablet 3   Calcium Carb-Cholecalciferol (CALCIUM 600+D3) 600-200 MG-UNIT TABS Take 1 tablet by mouth daily.      Cholecalciferol (VITAMIN D3) 1000 units CAPS Take 1 Capful by mouth daily.     cyanocobalamin ([employer] VITAMIN B12) 1000 MCG tablet TAKE 1 TABLET BY MOUTH EVERY DAY 100 tablet 2   FIBER PO Take by mouth daily. Takes gummies     isosorbide mononitrate (IMDUR) 30 MG 24 hr tablet Take 1 tablet (30 mg total) by mouth at bedtime. 90 tablet 3   levothyroxine (SYNTHROID) 75 MCG tablet Take 0.5 tablets (37.5 mcg total) by mouth daily. 45 tablet 3   losartan (COZAAR) 50 MG tablet TAKE 1 TABLET BY MOUTH EVERY DAY 90 tablet 3   metoprolol succinate (TOPROL XL) 25 MG 24 hr tablet Take 1 tablet (25 mg total) by mouth at bedtime. 90 tablet 3   Multiple Vitamins-Minerals (OCUVITE PRESERVISION) TABS Take 1 tablet by mouth 2 (two) times  a day.     Omega-3 Fatty Acids (FISH OIL OMEGA-3) 1000 MG CAPS Take 1 Capful by mouth daily.     ondansetron (ZOFRAN) 4 MG tablet Take 1 tablet (4 mg total) by mouth every 8 (eight) hours as needed. 20 tablet 1   pantoprazole (PROTONIX) 40 MG tablet Take 1 tablet (40 mg total) by mouth daily. 30 tablet 3   tobramycin (TOBREX) 0.3 % ophthalmic solution SMARTSIG:In Eye(s)     atorvastatin (LIPITOR) 40 MG tablet TAKE 1 TABLET BY MOUTH EVERY DAY 90 tablet 1   triamcinolone cream (KENALOG) 0.5 % Apply 1 Application topically 2 (two) times daily. 30 g 0   No facility-administered medications prior to visit.     Per HPI unless specifically indicated in ROS section  below Review of Systems  Constitutional:  Negative for fatigue and fever.  HENT:  Negative for congestion.   Eyes:  Negative for pain.  Respiratory:  Negative for cough and shortness of breath.   Cardiovascular:  Negative for chest pain, palpitations and leg swelling.  Gastrointestinal:  Negative for abdominal pain.  Genitourinary:  Negative for dysuria and vaginal bleeding.  Musculoskeletal:  Negative for back pain.  Neurological:  Negative for syncope, light-headedness and headaches.  Psychiatric/Behavioral:  Negative for dysphoric mood.    Objective:  BP 120/72 (BP Location: Right Arm, Patient Position: Sitting, Cuff Size: Normal)   Pulse (!) 58   Temp 97.7 F (36.5 C) (Temporal)   Ht 5' 3.5" (1.613 m)   Wt 162 lb 6 oz (73.7 kg)   SpO2 97%   BMI 28.31 kg/m   Wt Readings from Last 3 Encounters:  07/19/23 162 lb 6 oz (73.7 kg)  06/13/23 162 lb (73.5 kg)  06/05/23 164 lb 6 oz (74.6 kg)      Physical Exam Vitals and nursing note reviewed.  Constitutional:      General: She is not in acute distress.    Appearance: Normal appearance. She is well-developed. She is not ill-appearing or toxic-appearing.  HENT:     Head: Normocephalic.     Right Ear: Hearing, tympanic membrane, ear canal and external ear normal.     Left Ear: Hearing, tympanic membrane, ear canal and external ear normal.     Nose: Nose normal.  Eyes:     General: Lids are normal. Lids are everted, no foreign bodies appreciated.     Conjunctiva/sclera: Conjunctivae normal.     Pupils: Pupils are equal, round, and reactive to light.  Neck:     Thyroid: No thyroid mass or thyromegaly.     Vascular: No carotid bruit.     Trachea: Trachea normal.  Cardiovascular:     Rate and Rhythm: Normal rate and regular rhythm.     Heart sounds: Normal heart sounds, S1 normal and S2 normal. No murmur heard.    No gallop.  Pulmonary:     Effort: Pulmonary effort is normal. No respiratory distress.     Breath sounds: Normal  breath sounds. No wheezing, rhonchi or rales.  Abdominal:     General: Bowel sounds are normal. There is no distension or abdominal bruit.     Palpations: Abdomen is soft. There is no fluid wave or mass.     Tenderness: There is no abdominal tenderness. There is no guarding or rebound.     Hernia: No hernia is present.  Musculoskeletal:     Cervical back: Normal range of motion and neck supple.  Lymphadenopathy:     Cervical:  No cervical adenopathy.  Skin:    General: Skin is warm and dry.     Findings: No rash.  Neurological:     Mental Status: She is alert.     Cranial Nerves: No cranial nerve deficit.     Sensory: No sensory deficit.  Psychiatric:        Mood and Affect: Mood is not anxious or depressed.        Speech: Speech normal.        Behavior: Behavior normal. Behavior is cooperative.        Judgment: Judgment normal.       Results for orders placed or performed in visit on 07/15/23  Lipid panel  Result Value Ref Range   Cholesterol 145 0 - 200 mg/dL   Triglycerides 65.7 0.0 - 149.0 mg/dL   HDL 84.69 >62.95 mg/dL   VLDL 28.4 0.0 - 13.2 mg/dL   LDL Cholesterol 81 0 - 99 mg/dL   Total CHOL/HDL Ratio 3    NonHDL 96.65   Comprehensive metabolic panel  Result Value Ref Range   Sodium 141 135 - 145 mEq/L   Potassium 4.3 3.5 - 5.1 mEq/L   Chloride 104 96 - 112 mEq/L   CO2 29 19 - 32 mEq/L   Glucose, Bld 99 70 - 99 mg/dL   BUN 17 6 - 23 mg/dL   Creatinine, Ser 4.40 0.40 - 1.20 mg/dL   Total Bilirubin 0.6 0.2 - 1.2 mg/dL   Alkaline Phosphatase 71 39 - 117 U/L   AST 29 0 - 37 U/L   ALT 32 0 - 35 U/L   Total Protein 6.3 6.0 - 8.3 g/dL   Albumin 3.8 3.5 - 5.2 g/dL   GFR 10.27 (L) >25.36 mL/min   Calcium 9.5 8.4 - 10.5 mg/dL     COVID 19 screen:  No recent travel or known exposure to COVID19 The patient denies respiratory symptoms of COVID 19 at this time. The importance of social distancing was discussed today.   Assessment and Plan The patient's preventative  maintenance and recommended screening tests for an annual wellness exam were reviewed in full today. Brought up to date unless services declined.  Counselled on the importance of diet, exercise, and its role in overall health and mortality. The patient's FH and SH was reviewed, including their home life, tobacco status, and drug and alcohol status.   Vaccines: Up-to-date with pneumonia vaccine, Shingrix vaccine, COVID-vaccine x 4 and flu vaccine.  Next tetanus due in 2029 Pap/DVE: Not indicated due to age Mammo: Not indicated due to age and she has no family history Bone Density: Last done in 2016 showed osteopenia, she is not interested in doing this year Colon: Not indicated due to age  smoking Status: None ETOH/ drug use: None  Hep C: Completed   Problem List Items Addressed This Visit     Coronary artery disease involving native coronary artery with angina pectoris (HCC) (Chronic)    Medical management.  Has  Nitro , never uses.      Relevant Medications   rosuvastatin (CRESTOR) 40 MG tablet   Essential hypertension (Chronic)    Stable, chronic.  Continue current medication.   Continue losartan 50 mg daily Toprol 25 mg daily and Imdur 30 mg daily.      Relevant Medications   rosuvastatin (CRESTOR) 40 MG tablet   Hyperlipidemia LDL goal <70 (Chronic)    LDL not at goal < 70... cardiology had recommended if this was the case  to change to crestor 40 mg daily.  She is willing to make this change.  She has follow up with cardiology next month.       Relevant Medications   rosuvastatin (CRESTOR) 40 MG tablet   Other Relevant Orders   Lipid panel   Comprehensive metabolic panel   Hypothyroidism (Chronic)    Restarted levo  37.5 mg daily  TSH elevated but free T3 and T4 nml.  She feels well.. no cold intolerance, no fatigue, good energy, no swelling... she wishes to continue current dose for now.  We will recheck levels in  6 months.      Relevant Orders   T4, free    T3, free   TSH   Other Visit Diagnoses     Routine general medical examination at a health care facility    -  Primary           Kerby Nora, MD

## 2023-07-19 NOTE — Assessment & Plan Note (Addendum)
Restarted levo  37.5 mg daily  TSH elevated but free T3 and T4 nml.  She feels well.. no cold intolerance, no fatigue, good energy, no swelling... she wishes to continue current dose for now.  We will recheck levels in  6 months.

## 2023-07-19 NOTE — Assessment & Plan Note (Signed)
Medical management.  Has  Nitro , never uses.

## 2023-07-30 ENCOUNTER — Ambulatory Visit: Payer: Medicare Other | Admitting: Obstetrics & Gynecology

## 2023-07-30 ENCOUNTER — Encounter: Payer: Self-pay | Admitting: Obstetrics & Gynecology

## 2023-07-30 VITALS — BP 159/75 | HR 73

## 2023-07-30 DIAGNOSIS — N8110 Cystocele, unspecified: Secondary | ICD-10-CM | POA: Diagnosis not present

## 2023-07-30 DIAGNOSIS — Z4689 Encounter for fitting and adjustment of other specified devices: Secondary | ICD-10-CM

## 2023-07-30 NOTE — Progress Notes (Addendum)
   GYNECOLOGY OFFICE VISIT NOTE  History:   Kristina Carlson is a 87 y.o. [phone] female with stage II pelvic organ prolapse who presents for pessary maintenance.  She is followed by Dr. Marilynne (Urogynecology) and has been using a size #4 ring with support pessary. The pessary has been working well, no complaints.  She denies any abnormal vaginal discharge, bleeding, pelvic pain or other concerns.    Past Medical History:  Diagnosis Date   Coronary artery disease involving native coronary artery with angina pectoris (HCC) 06/2014   CARDIAC CATH 06/2014: Mild LAD 50%, OM 1 30%, 50% ostial RCA:;; Coronary CTA February 2024 shows severe ostial RCA disease (FFR ct +0.76).  Mild proximal LAD and LCx stenosis.   Dysplastic nevus 01/24/2023   Right chest. Severe atypia, deep margin involved. Excised 03/06/23   Hyperlipidemia    Hypertension    Hypothyroid    Insomnia    Macular degeneration    -- Dr. Rudell Rue (optometrist).  Taking Ocuvite.    Past Surgical History:  Procedure Laterality Date   Cardiac CTA  01/25/2023   Coronary Calcium  Score 384.  Severe (70 to 99%) ostial RCA=> FFR ct 0.76 (significant).  Mild proximal LAD and LCx stenosis (25 to 49%).   LEFT HEART CATHETERIZATION WITH CORONARY ANGIOGRAM N/A 06/29/2014   Procedure: LEFT HEART CATHETERIZATION WITH CORONARY ANGIOGRAM;  Surgeon: Victory LELON Claudene DOUGLAS, MD;  Location: [facility] CATH LAB;  Service: Cardiovascular;: Mild LAD 50%, OM 1 30%, 50% ostial RCA:   The following portions of the patient's history were reviewed and updated as appropriate: allergies, current medications, past family history, past medical history, past social history, past surgical history and problem list.   Review of Systems:  Pertinent items noted in HPI and remainder of comprehensive ROS otherwise negative.  Physical Exam:  BP (!) 159/75   Pulse 73  CONSTITUTIONAL: Well-developed, well-nourished female in no acute distress.  MUSCULOSKELETAL: Normal range of  motion. No edema noted. NEUROLOGIC: Alert and oriented to person, place, and time. Normal muscle tone coordination. No cranial nerve deficit noted. PSYCHIATRIC: Normal mood and affect. Normal behavior. Normal judgment and thought content. CARDIOVASCULAR: Normal heart rate noted RESPIRATORY: Effort and breath sounds normal, no problems with respiration noted ABDOMEN: No masses noted. No other overt distention noted.   PELVIC: Normal external female genitalia with moderate atrophy; Urethral meatus normal in appearance, no urethral masses or discharge. The pessary was noted to be in place. It was removed and cleaned. Exam revealed no lesions in the vagina. The pessary was replaced. It was comfortable to the patient and fit well.  Examination done in presence of RN as the chaperone.     Assessment and Plan:     1. Stage II Pelvic Organ Prolapse 2. Pessary maintenance - Continue size #4 ring with support pessary - Follow up 3 months for pessary check  Routine preventative health maintenance measures emphasized, follow up with PCP regarding other medical issues. Please refer to After Visit Summary for other counseling recommendations.   Return in about 3 months (around 10/30/2023) for Pessary maintenance.    I spent 20 minutes dedicated to the care of this patient including pre-visit review of records, face to face time with the patient discussing her conditions and treatments and post visit orders.   Kristina HUGGER, MD, FACOG Obstetrician & Gynecologist, [facility] for [employer], [facility]

## 2023-08-07 ENCOUNTER — Other Ambulatory Visit: Payer: Self-pay | Admitting: *Deleted

## 2023-08-07 DIAGNOSIS — I10 Essential (primary) hypertension: Secondary | ICD-10-CM

## 2023-08-07 MED ORDER — LOSARTAN POTASSIUM 50 MG PO TABS: 50.0000 mg | ORAL_TABLET | Freq: Every day | ORAL | 3 refills | Status: DC

## 2023-08-13 DIAGNOSIS — H353134 Nonexudative age-related macular degeneration, bilateral, advanced atrophic with subfoveal involvement: Secondary | ICD-10-CM | POA: Diagnosis not present

## 2023-08-13 DIAGNOSIS — H353114 Nonexudative age-related macular degeneration, right eye, advanced atrophic with subfoveal involvement: Secondary | ICD-10-CM | POA: Diagnosis not present

## 2023-08-13 DIAGNOSIS — H04123 Dry eye syndrome of bilateral lacrimal glands: Secondary | ICD-10-CM | POA: Diagnosis not present

## 2023-08-13 DIAGNOSIS — H43813 Vitreous degeneration, bilateral: Secondary | ICD-10-CM | POA: Diagnosis not present

## 2023-08-15 DIAGNOSIS — M1612 Unilateral primary osteoarthritis, left hip: Secondary | ICD-10-CM | POA: Diagnosis not present

## 2023-08-15 DIAGNOSIS — M5136 Other intervertebral disc degeneration, lumbar region: Secondary | ICD-10-CM | POA: Diagnosis not present

## 2023-08-15 DIAGNOSIS — M7062 Trochanteric bursitis, left hip: Secondary | ICD-10-CM | POA: Diagnosis not present

## 2023-08-15 DIAGNOSIS — M545 Low back pain, unspecified: Secondary | ICD-10-CM | POA: Diagnosis not present

## 2023-08-15 DIAGNOSIS — M4126 Other idiopathic scoliosis, lumbar region: Secondary | ICD-10-CM | POA: Diagnosis not present

## 2023-08-15 DIAGNOSIS — M47818 Spondylosis without myelopathy or radiculopathy, sacral and sacrococcygeal region: Secondary | ICD-10-CM | POA: Diagnosis not present

## 2023-08-15 DIAGNOSIS — M79605 Pain in left leg: Secondary | ICD-10-CM | POA: Diagnosis not present

## 2023-08-15 DIAGNOSIS — M47816 Spondylosis without myelopathy or radiculopathy, lumbar region: Secondary | ICD-10-CM | POA: Diagnosis not present

## 2023-08-15 DIAGNOSIS — M5137 Other intervertebral disc degeneration, lumbosacral region: Secondary | ICD-10-CM | POA: Diagnosis not present

## 2023-08-19 ENCOUNTER — Ambulatory Visit: Payer: Medicare Other | Attending: Medical | Admitting: Medical

## 2023-08-19 ENCOUNTER — Encounter: Payer: Self-pay | Admitting: Medical

## 2023-08-19 VITALS — BP 147/79 | HR 62 | Ht 63.0 in | Wt 165.6 lb

## 2023-08-19 DIAGNOSIS — E782 Mixed hyperlipidemia: Secondary | ICD-10-CM | POA: Diagnosis not present

## 2023-08-19 DIAGNOSIS — I25119 Atherosclerotic heart disease of native coronary artery with unspecified angina pectoris: Secondary | ICD-10-CM | POA: Diagnosis not present

## 2023-08-19 DIAGNOSIS — I10 Essential (primary) hypertension: Secondary | ICD-10-CM | POA: Diagnosis not present

## 2023-08-19 MED ORDER — LOSARTAN POTASSIUM 100 MG PO TABS: 100.0000 mg | ORAL_TABLET | Freq: Every day | ORAL | 3 refills | Status: DC

## 2023-08-19 MED ORDER — EZETIMIBE 10 MG PO TABS: 10.0000 mg | ORAL_TABLET | Freq: Every day | ORAL | 3 refills | Status: DC

## 2023-08-19 NOTE — Progress Notes (Signed)
Cardiology Office Note:    Date:  08/19/2023   ID:  Kristina Carlson, DOB 1936/07/06, MRN [id number]  PCP:  Excell Seltzer, MD  [facility] Cardiologist:  None  [facility] Electrophysiologist:  None   Referring MD: Excell Seltzer, MD   Chief Complaint: 41-month follow-up  History of Present Illness:    Kristina Carlson is a 87 y.o. female with a hx of hyperlipidemia, hypertension, CAD, and left hip pain who presents for follow-up.  Cardiac CTA February 2024 showed severe ostial RCA disease (70 to 99% with FFR 0.76) with a coronary calcium score of 384. Recommended cardiac cath, but patient preferred to continue with medical management.  Patient was last seen in April 2024 and reported improved chest pain on Toprol and Imdur.   Today, the patient reports rash and itching on her chest and face from BP meds. She was started on both Toprol and Imdur in February. She believes it is Imdur, so we will stop this. BP today is a little high, although she says it's better at home. She reports occasional chest pain. Breathing is stable. No lower leg edema, orthopnea or pnd.   Past Medical History:  Diagnosis Date   Coronary artery disease involving native coronary artery with angina pectoris (HCC) 06/2014   CARDIAC CATH 06/2014: Mild LAD 50%, OM 1 30%, 50% ostial RCA:;; Coronary CTA February 2024 shows severe ostial RCA disease (FFR ct +0.76).  Mild proximal LAD and LCx stenosis.   Dysplastic nevus 01/24/2023   Right chest. Severe atypia, deep margin involved. Excised 03/06/23   Hyperlipidemia    Hypertension    Hypothyroid    Insomnia    Macular degeneration    -- Dr. Minerva Areola (optometrist).  Taking Ocuvite.    Past Surgical History:  Procedure Laterality Date   Cardiac CTA  01/25/2023   Coronary Calcium Score 384.  Severe (70 to 99%) ostial RCA=> FFR ct 0.76 (significant).  Mild proximal LAD and LCx stenosis (25 to 49%).   LEFT HEART CATHETERIZATION WITH CORONARY ANGIOGRAM N/A  06/29/2014   Procedure: LEFT HEART CATHETERIZATION WITH CORONARY ANGIOGRAM;  Surgeon: Lesleigh Noe, MD;  Location: [facility] CATH LAB;  Service: Cardiovascular;: Mild LAD 50%, OM 1 30%, 50% ostial RCA:    Current Medications: Current Meds  Medication Sig   aspirin 81 MG EC tablet TAKE 1 TABLET (81 MG TOTAL) BY MOUTH DAILY. SWALLOW WHOLE.   Calcium Carb-Cholecalciferol (CALCIUM 600+D3) 600-200 MG-UNIT TABS Take 1 tablet by mouth daily.    Cholecalciferol (VITAMIN D3) 1000 units CAPS Take 1 Capful by mouth daily.   cyanocobalamin ([employer] VITAMIN B12) 1000 MCG tablet TAKE 1 TABLET BY MOUTH EVERY DAY   ezetimibe (ZETIA) 10 MG tablet Take 1 tablet (10 mg total) by mouth daily.   FIBER PO Take by mouth daily. Takes gummies   levothyroxine (SYNTHROID) 75 MCG tablet Take 0.5 tablets (37.5 mcg total) by mouth daily.   metoprolol succinate (TOPROL XL) 25 MG 24 hr tablet Take 1 tablet (25 mg total) by mouth at bedtime.   Multiple Vitamins-Minerals (OCUVITE PRESERVISION) TABS Take 1 tablet by mouth 2 (two) times a day.   Omega-3 Fatty Acids (FISH OIL OMEGA-3) 1000 MG CAPS Take 1 Capful by mouth daily.   ondansetron (ZOFRAN) 4 MG tablet Take 1 tablet (4 mg total) by mouth every 8 (eight) hours as needed.   pantoprazole (PROTONIX) 40 MG tablet Take 1 tablet (40 mg total) by mouth daily.   rosuvastatin (  CRESTOR) 40 MG tablet Take 1 tablet (40 mg total) by mouth daily.   tobramycin (TOBREX) 0.3 % ophthalmic solution SMARTSIG:In Eye(s)   triamcinolone cream (KENALOG) 0.5 % Apply 1 Application topically 2 (two) times daily.   [DISCONTINUED] isosorbide mononitrate (IMDUR) 30 MG 24 hr tablet Take 1 tablet (30 mg total) by mouth at bedtime.   [DISCONTINUED] losartan (COZAAR) 50 MG tablet Take 1 tablet (50 mg total) by mouth daily.     Allergies:   Lisinopril   Social History   Socioeconomic History   Marital status: Widowed    Spouse name: Not on file   Number of children: 1   Years of education: 12    Highest education level: High school graduate  Occupational History   Occupation: Retired   Tobacco Use   Smoking status: Never   Smokeless tobacco: Never  Vaping Use   Vaping status: Never Used  Substance and Sexual Activity   Alcohol use: No   Drug use: No   Sexual activity: Not Currently  Other Topics Concern   Not on file  Social History Narrative   01/25/21   From: the area   Living: alone, has family nearby   Work: retired - Public librarian       Family: 1 child - Kristina Carlson - 2 grandchildren and 2 great grandchildren      Enjoys: spend time with friends, reading      Exercise: not currently   Diet: pretty good, healthy foods, 3 meals a day      Safety   Seat belts: Yes    Guns: No   Safe in relationships: Yes    Social Determinants of Health   Financial Resource Strain: Low Risk  (06/13/2023)   Overall Financial Resource Strain (CARDIA)    Difficulty of Paying Living Expenses: Not hard at all  Food Insecurity: No Food Insecurity (06/13/2023)   Hunger Vital Sign    Worried About Running Out of Food in the Last Year: Never true    Ran Out of Food in the Last Year: Never true  Transportation Needs: No Transportation Needs (06/13/2023)   PRAPARE - Administrator, Civil Service (Medical): No    Lack of Transportation (Non-Medical): No  Physical Activity: Insufficiently Active (06/13/2023)   Exercise Vital Sign    Days of Exercise per Week: 7 days    Minutes of Exercise per Session: 10 min  Stress: No Stress Concern Present (06/13/2023)   [employer] of Occupational Health - Occupational Stress Questionnaire    Feeling of Stress : Not at all  Social Connections: Moderately Integrated (06/13/2023)   Social Connection and Isolation Panel [NHANES]    Frequency of Communication with Friends and Family: More than three times a week    Frequency of Social Gatherings with Friends and Family: More than three times a week    Attends Religious Services: More than 4  times per year    Active Member of [employer] or Organizations: Yes    Attends Banker Meetings: More than 4 times per year    Marital Status: Widowed     Family History: The patient's family history includes Breast cancer in her cousin; Diabetes in her brother; Lymphoma in her brother; Stroke in her father.  ROS:   Please see the history of present illness.     All other systems reviewed and are negative.  EKGs/Labs/Other Studies Reviewed:    The following studies were reviewed today:  Cardiac CTA  01/2023  IMPRESSION: 1. Coronary calcium score of 387. This was 69th percentile for age and sex matched control.   2. Normal coronary origin with right dominance.   3. Calcified plaque causing severe ostial RCA stenosis (>70%)   4. Mild proximal LAD and LCx stenosis (25-49%).   5. CAD-RADS 4 Severe stenosis. (70-99% or > 50% left main). Cardiac catheterization is recommended. Consider symptom-guided anti-ischemic pharmacotherapy as well as risk factor modification per guideline directed care.   6. Additional analysis with CT FFR will be submitted and reported separately.   FFR   1. Left Main:  No significant stenosis.   2. LAD: No significant stenosis. FFRct 0.96 3. LCX: Not analyzed. 4. RCA: significant ostial stenosis. FFRct 0.76   IMPRESSION: 1.  CT FFR analysis showed significant ostial RCA stenosis.   2.  Recommend cardiac catheterization.     Electronically Signed   By: Debbe Odea M.D.   On: 01/25/2023 13:04    EKG:  EKG is not ordered today.    Recent Labs: 02/06/2023: TSH 11.13 07/15/2023: ALT 32; BUN 17; Creatinine, Ser 1.01; Potassium 4.3; Sodium 141  Recent Lipid Panel    Component Value Date/Time   CHOL 145 07/15/2023 0807   CHOL 130 10/15/2019 1431   TRIG 76.0 07/15/2023 0807   HDL 48.50 07/15/2023 0807   HDL 34 (L) 10/15/2019 1431   CHOLHDL 3 07/15/2023 0807   VLDL 15.2 07/15/2023 0807   LDLCALC 81 07/15/2023 0807   LDLCALC  62 10/15/2019 1431   LDLDIRECT 84.0 06/06/2021 1153   Physical Exam:    VS:  BP (!) 147/79 (BP Location: Left Arm, Patient Position: Sitting, Cuff Size: Normal)   Pulse 62   Ht 5\' 3"  (1.6 m)   Wt 165 lb 9.6 oz (75.1 kg)   SpO2 96%   BMI 29.33 kg/m     Wt Readings from Last 3 Encounters:  08/19/23 165 lb 9.6 oz (75.1 kg)  07/19/23 162 lb 6 oz (73.7 kg)  06/13/23 162 lb (73.5 kg)     GEN:  Well nourished, well developed in no acute distress HEENT: Normal NECK: No JVD; No carotid bruits LYMPHATICS: No lymphadenopathy CARDIAC: RRR, no murmurs, rubs, gallops RESPIRATORY:  Clear to auscultation without rales, wheezing or rhonchi  ABDOMEN: Soft, non-tender, non-distended MUSCULOSKELETAL:  No edema; No deformity  SKIN: Warm and dry NEUROLOGIC:  Alert and oriented x 3 PSYCHIATRIC:  Normal affect   ASSESSMENT:    1. Coronary artery disease involving native coronary artery of native heart with angina pectoris (HCC)   2. Primary hypertension   3. Hyperlipidemia, mixed    PLAN:    In order of problems listed above:  CAD Patient was started on Toprol and Imdur in February 2024 and angina.  She reports rash and itching on chest and face since that time.  We will try stopping Imdur.  Patient reports rare chest pain episodes.  Unable to titrate beta-blocker due to heart rate in the 60s.  Patient has sublingual nitroglycerin at home.  Patient may need other antianginal medication at follow-up if symptoms persist.  HTN Blood pressure is elevated today.  Stopping Imdur as above possible rash and itching.  I will increase losartan to 100 mg daily.  Continue Toprol 25 mg daily.  HLD Repeat LDL 81. I will add zetia. Continue Crestor 40mg  daily.   Disposition: Follow up in 4 month(s) with MD/APP    Signed, Rogerio Boutelle David Stall, PA-C  08/19/2023 10:47 AM  [facility]

## 2023-08-19 NOTE — Patient Instructions (Signed)
Medication Instructions:  Your physician recommends the following medication changes.  STOP TAKING: Imdur 30 mg daily  START TAKING: Zetia 10 mg by mouth daily  INCREASE: Losartan 100 mg by mouth daily    *If you need a refill on your cardiac medications before your next appointment, please call your pharmacy*   Lab Work: No labs ordered today    Testing/Procedures: No test ordered today    Follow-Up: At [facility], you and your health needs are our priority.  As part of our continuing mission to provide you with exceptional heart care, we have created designated Provider Care Teams.  These Care Teams include your primary Cardiologist (physician) and Advanced Practice Providers (APPs -  Physician Assistants and Nurse Practitioners) who all work together to provide you with the care you need, when you need it.  We recommend signing up for the patient portal called "MyChart".  Sign up information is provided on this After Visit Summary.  MyChart is used to connect with patients for Virtual Visits (Telemedicine).  Patients are able to view lab/test results, encounter notes, upcoming appointments, etc.  Non-urgent messages can be sent to your provider as well.   To learn more about what you can do with MyChart, go to [email or web address].    Your next appointment:   4 month(s)  Provider:   Bryan Lemma, MD

## 2023-08-21 DIAGNOSIS — M5451 Vertebrogenic low back pain: Secondary | ICD-10-CM | POA: Diagnosis not present

## 2023-08-21 DIAGNOSIS — M79605 Pain in left leg: Secondary | ICD-10-CM | POA: Diagnosis not present

## 2023-08-23 DIAGNOSIS — M5451 Vertebrogenic low back pain: Secondary | ICD-10-CM | POA: Diagnosis not present

## 2023-08-23 DIAGNOSIS — M79605 Pain in left leg: Secondary | ICD-10-CM | POA: Diagnosis not present

## 2023-08-27 DIAGNOSIS — M79605 Pain in left leg: Secondary | ICD-10-CM | POA: Diagnosis not present

## 2023-08-27 DIAGNOSIS — M5451 Vertebrogenic low back pain: Secondary | ICD-10-CM | POA: Diagnosis not present

## 2023-09-16 DIAGNOSIS — M5451 Vertebrogenic low back pain: Secondary | ICD-10-CM | POA: Diagnosis not present

## 2023-09-16 DIAGNOSIS — M79605 Pain in left leg: Secondary | ICD-10-CM | POA: Diagnosis not present

## 2023-09-18 ENCOUNTER — Other Ambulatory Visit: Payer: Self-pay | Admitting: Family Medicine

## 2023-09-18 DIAGNOSIS — M79605 Pain in left leg: Secondary | ICD-10-CM | POA: Diagnosis not present

## 2023-09-18 DIAGNOSIS — M5451 Vertebrogenic low back pain: Secondary | ICD-10-CM | POA: Diagnosis not present

## 2023-09-19 ENCOUNTER — Ambulatory Visit: Payer: Medicare Other | Admitting: Dermatology

## 2023-09-19 ENCOUNTER — Encounter: Payer: Self-pay | Admitting: Dermatology

## 2023-09-19 DIAGNOSIS — W548XXA Other contact with dog, initial encounter: Secondary | ICD-10-CM

## 2023-09-19 DIAGNOSIS — L738 Other specified follicular disorders: Secondary | ICD-10-CM | POA: Diagnosis not present

## 2023-09-19 DIAGNOSIS — L03115 Cellulitis of right lower limb: Secondary | ICD-10-CM | POA: Diagnosis not present

## 2023-09-19 DIAGNOSIS — L821 Other seborrheic keratosis: Secondary | ICD-10-CM | POA: Diagnosis not present

## 2023-09-19 DIAGNOSIS — S80811A Abrasion, right lower leg, initial encounter: Secondary | ICD-10-CM | POA: Diagnosis not present

## 2023-09-19 MED ORDER — AMOXICILLIN-POT CLAVULANATE 875-125 MG PO TABS: ORAL_TABLET | ORAL | 0 refills | Status: DC

## 2023-09-19 NOTE — Patient Instructions (Signed)

## 2023-09-19 NOTE — Progress Notes (Signed)
   Follow Up Visit   Subjective  Kristina Carlson is a 87 y.o. female who presents for the following: Rash - since April after starting Isosorbide mononitrate ER 30 mg po QHS, and Metoprolol succ ER 25 mg po QD. Her cardiologist didn't think the rash was related to starting the new medications, but she stopped the Metoprolol succ ER 25 mg tab one month ago, and the Isosorbide mononitrate ER 30 tab about 3 weeks ago. Rash continues to occur on the chest, face, and shoulders and is occasionally itchy. Patient states no changes in lotions, shampoos, soaps, or laundry detergents, and states no pets in the home, no new illness or surgeries before the rash started. She uses TMC 0.1% PRN itch that was prescribed by her PCP.   Dog scratch on the R lower leg that occurred two weeks ago, patient c/o shooting pain at site, is concerned, and would like it checked today.   The following portions of the chart were reviewed this encounter and updated as appropriate: medications, allergies, medical history  Review of Systems:  No other skin or systemic complaints except as noted in HPI or Assessment and Plan.  Objective  Well appearing patient in no apparent distress; mood and affect are within normal limits.  A focused examination was performed of the following areas: The face, chest, back, and R lower leg  Relevant exam findings are noted in the Assessment and Plan.  R lower leg Crusted ulceration with surrounding erythema and edema tender to palpation.    Assessment & Plan   Cellulitis of right lower extremity R lower leg  From dog scratch -   Start Augmentin 875-125 mg po BID x 10 days #20 0RF. Discussed potential side effects of diarrhea or upset stomach. Patient to contact the office should that occur.   Wound was cleaned today with Puracyn spray, covered with a thin layer of Mupirocin 2% ointment, a non-stick gauze, and wrapped in Coban.   Anaerobic and Aerobic Culture - R lower  leg  Seborrheic keratoses  Disseminate infundibulo-folliculitis   RASH - Disseminate and recurrent infundibulofolliculitis Exam: prominent hair follicles on the chest and neck.  Chronic and persistent condition with duration or expected duration over one year. Condition is symptomatic/ bothersome to patient. Not currently at goal.   Treatment Plan: Recommend Amlactin or CeraVe SA cream daily, continue TMC 0.1% cream PRN itch.   Consider biopsy in the future if rash continues to flare.   SEBORRHEIC KERATOSIS - R posterior shoulder - Stuck-on, waxy, tan-brown papules and/or plaques  - Benign-appearing - Discussed benign etiology and prognosis. - Observe - Call for any changes  Return for appointment as scheduled.  Maylene Roes, CMA, am acting as scribe for Elie Goody, MD .   Documentation: I have reviewed the above documentation for accuracy and completeness, and I agree with the above.  Elie Goody, MD

## 2023-09-23 DIAGNOSIS — M79605 Pain in left leg: Secondary | ICD-10-CM | POA: Diagnosis not present

## 2023-09-23 DIAGNOSIS — M5451 Vertebrogenic low back pain: Secondary | ICD-10-CM | POA: Diagnosis not present

## 2023-09-25 ENCOUNTER — Telehealth: Payer: Self-pay

## 2023-09-25 LAB — ANAEROBIC AND AEROBIC CULTURE

## 2023-09-25 NOTE — Telephone Encounter (Signed)
Patient informed of culture results, and states that site is looking better, but she does occasional feel shooting pain. Advised patient to contact our office if any changes.

## 2023-09-25 NOTE — Telephone Encounter (Signed)
-----   Message from [place] sent at 09/25/2023  1:37 PM EDT ----- Please call and share that culture from right leg grew normal Staph aureus (NOT MRSA). The augmentin should treat the infection. Please get update on wound. Thank you

## 2023-09-26 DIAGNOSIS — M5451 Vertebrogenic low back pain: Secondary | ICD-10-CM | POA: Diagnosis not present

## 2023-09-26 DIAGNOSIS — M79605 Pain in left leg: Secondary | ICD-10-CM | POA: Diagnosis not present

## 2023-09-30 DIAGNOSIS — M79605 Pain in left leg: Secondary | ICD-10-CM | POA: Diagnosis not present

## 2023-09-30 DIAGNOSIS — M5451 Vertebrogenic low back pain: Secondary | ICD-10-CM | POA: Diagnosis not present

## 2023-10-02 DIAGNOSIS — M5451 Vertebrogenic low back pain: Secondary | ICD-10-CM | POA: Diagnosis not present

## 2023-10-02 DIAGNOSIS — M79605 Pain in left leg: Secondary | ICD-10-CM | POA: Diagnosis not present

## 2023-10-07 DIAGNOSIS — M5451 Vertebrogenic low back pain: Secondary | ICD-10-CM | POA: Diagnosis not present

## 2023-10-07 DIAGNOSIS — M79605 Pain in left leg: Secondary | ICD-10-CM | POA: Diagnosis not present

## 2023-10-08 DIAGNOSIS — H353134 Nonexudative age-related macular degeneration, bilateral, advanced atrophic with subfoveal involvement: Secondary | ICD-10-CM | POA: Diagnosis not present

## 2023-10-08 DIAGNOSIS — H353114 Nonexudative age-related macular degeneration, right eye, advanced atrophic with subfoveal involvement: Secondary | ICD-10-CM | POA: Diagnosis not present

## 2023-10-08 DIAGNOSIS — H04123 Dry eye syndrome of bilateral lacrimal glands: Secondary | ICD-10-CM | POA: Diagnosis not present

## 2023-10-08 DIAGNOSIS — H43813 Vitreous degeneration, bilateral: Secondary | ICD-10-CM | POA: Diagnosis not present

## 2023-10-08 DIAGNOSIS — H353124 Nonexudative age-related macular degeneration, left eye, advanced atrophic with subfoveal involvement: Secondary | ICD-10-CM | POA: Diagnosis not present

## 2023-10-10 ENCOUNTER — Other Ambulatory Visit: Payer: Self-pay | Admitting: Family Medicine

## 2023-10-10 DIAGNOSIS — E538 Deficiency of other specified B group vitamins: Secondary | ICD-10-CM

## 2023-10-16 ENCOUNTER — Telehealth: Payer: Self-pay | Admitting: Cardiology

## 2023-10-16 NOTE — Telephone Encounter (Signed)
Patient states she has noticed an increase in chest discomfort since stopping her Imdur due to a rash at her last visit. She states she just feels it getting worse and worse. It is continuous, does not worsen with movement and does not improve with rest. It stays the same. Patient states it is on her left side, denies sob, swelling, arm pain, neck pain. Patient states she checked her blood pressure last week and it was 143/78. She states she does not have a way to check it at home, but checks it at her pharmacy. Advised if she could to get this checked for [place] so we can have an updated reading today or tomorrow with HR. Patient verbalized understanding. I did schedule a follow up visit with patient for Friday at 8:50 AM. Advised patient if symptoms worsen she should go to the ED. Patient states she has nitros but they are really old and would like to discuss getting an updated RX of this as well.

## 2023-10-16 NOTE — Telephone Encounter (Signed)
Pt c/o of Chest Pain: STAT if CP now or developed within 24 hours  1. Are you having CP right now? Yes   2. Are you experiencing any other symptoms (ex. SOB, nausea, vomiting, sweating)? No   3. How long have you been experiencing CP? Over a week   4. Is your CP continuous or coming and going? Continuous   5. Have you taken Nitroglycerin? Yes - but not today.  ?

## 2023-10-16 NOTE — Telephone Encounter (Signed)
If she did not tolerate Imdur, we can probably start with amlodipine 2.5 mg daily and also refill her as needed NTG.  Very interesting that it does not seem to be worse with exertion.  If there is some spasm component the amlodipine will help.   Bryan Lemma, MD

## 2023-10-17 MED ORDER — NITROGLYCERIN 0.4 MG SL SUBL: 0.4000 mg | SUBLINGUAL_TABLET | SUBLINGUAL | 3 refills | Status: AC | PRN

## 2023-10-17 MED ORDER — AMLODIPINE BESYLATE 2.5 MG PO TABS: 2.5000 mg | ORAL_TABLET | Freq: Every day | ORAL | 3 refills | Status: DC

## 2023-10-17 NOTE — Progress Notes (Signed)
Cardiology Clinic Note   Date: 10/19/2023 ID: [name], DOB [birth year], MRN [id number]  Primary Cardiologist:  Bryan Lemma, MD  Patient Profile    Kristina Carlson is a 87 y.o. female who presents to the clinic today for evaluation of chest pain.     Past medical history significant for: CAD. LHC 06/29/2014 (chest pain): Widely patent coronary arteries with 40 to 50% ostial RCA and up to 50% mid LAD.  Ostial LCx <30%.  Normal LV function. Coronary CTA with FFR 01/25/2023: Coronary calcium score 387 (69th percentile).  Severe ostial RCA stenosis > 70%.  Mild proximal LAD and LCx stenosis 25 to 49%.  FFR showed significant ostial RCA stenosis.  Recommend cardiac catheterization. Hypertension. Hyperlipidemia. Lipid panel 07/15/2023: LDL 81, HDL 49, TG 76, total 145. GERD. Hypothyroidism.  In summary, patient underwent hospital admission in July 2015 for chest pain.  LHC showed widely patent coronary arteries with 40 to 50% ostial RCA and up to 50% mid LAD, <30% ostial LCx.  She was seen virtually by Dr. Katrinka Blazing on 04/21/2019 with complaints of similar chest pain.  She had recently been evaluated in the ED with normal EKG and unremarkable cardiac markers.  It was felt her symptoms were musculoskeletal/nonischemic and she was trialed on ibuprofen 400 mg twice daily x 4 doses.  She was also provided with an Rx for as needed SL NTG.  She was instructed to contact the office with persistent discomfort.    Patient was first seen by Dr. Herbie Baltimore on 01/10/2023 for evaluation of chest pain at the request of Dr. Patsy Lager.  Patient described a month-long history of intermittent episodic chest pain starting at the center and radiating to both sides.  Pain usually worse with lying flat or on her left side and better if she lifts the head of the bed or sits up.  It is not associated with exertion.  Coronary CTA with FFR showed significant ostial RCA stenosis and heart catheterization was recommended.  Upon  follow-up she opted for medical management and close follow-up.  She was started on isosorbide and Toprol.  On follow-up April 2024 she reported less frequent chest discomfort and shortness of breath.     History of Present Illness    Kristina Carlson is followed by Dr. Herbie Baltimore for the above outlined history.  She was last seen in the office by Cadence Furth, PA-C on 08/19/2023 for routine follow-up.  Unfortunately patient developed a rash on her chest and face and isosorbide was stopped.  Beta-blocker was unable to be titrated secondary to HR in the 60s.  Zetia was added for better lipid control.  Patient contacted the office on 10/16/2023 with complaints of chest pain.  Per triage RN "Patient states she has noticed an increase in chest discomfort since stopping her Imdur due to a rash at her last visit. She states she just feels it getting worse and worse. It is continuous, does not worsen with movement and does not improve with rest. It stays the same. Patient states it is on her left side, denies sob, swelling, arm pain, neck pain. Patient states she checked her blood pressure last week and it was 143/78. She states she does not have a way to check it at home, but checks it at her pharmacy. Advised if she could to get this checked for [place] so we can have an updated reading today or tomorrow with HR. Patient verbalized understanding. I did schedule a follow up visit with  patient for Friday at 8:50 AM. Advised patient if symptoms worsen she should go to the ED. Patient states she has nitros but they are really old and would like to discuss getting an updated RX of this as well."  Dr. Herbie Baltimore recommended amlodipine 2.5 mg daily.   Discussed the use of AI scribe software for clinical note transcription with the patient, who gave verbal consent to proceed.  The patient is here alone. She presents with persistent chest pain and a burning sensation for about two weeks. The discomfort is constant and located  across the center chest and radiates to under the left arm. The pain is more pronounced at night, causing the patient to wake up. Accompanying the pain is nausea, which has been a long-standing issue. The patient reports that the pain has been worsening over the past couple of weeks and is not related to physical activity or specific movements. The patient also reports a rash, which was initially thought to be related to medication (isosorbide and metoprolol). She feels rash is still there but it has improved. Due to a misunderstanding, the patient stopped taking metoprolol and continued isosorbide. The patient also has bursitis in the hip and has been undergoing physical therapy, which does not seem to exacerbate the chest pain. She has not started amlodipine yet.        ROS: All other systems reviewed and are otherwise negative except as noted in History of Present Illness.  Studies Reviewed    EKG Interpretation Date/Time:  Friday October 18 2023 09:01:28 EDT Ventricular Rate:  86 PR Interval:  156 QRS Duration:  70 QT Interval:  352 QTC Calculation: 421 R Axis:   -19  Text Interpretation: Normal sinus rhythm Low voltage QRS When compared with ECG of 04/18/2023 (not in muse) no significant changes Confirmed by Carlos Levering [phone]) on 10/18/2023 9:12:26 AM    Risk Assessment/Calculations      HYPERTENSION CONTROL Vitals:   10/18/23 0855 10/18/23 0904 10/18/23 0925  BP: (!) 152/83 (!) 152/62 (!) 142/80    The patient's blood pressure is elevated above target today.  In order to address the patient's elevated BP: A new medication was prescribed today.           Physical Exam    VS:  BP (!) 152/62 (BP Location: Left Arm, Patient Position: Supine, Cuff Size: Normal)   Pulse 86   Ht 5\' 3"  (1.6 m)   Wt 165 lb 6.4 oz (75 kg)   SpO2 97%   BMI 29.30 kg/m  , BMI Body mass index is 29.3 kg/m.  GEN: Well nourished, well developed, in no acute distress. Neck: No JVD or  carotid bruits. Cardiac:  RRR. No murmurs. No rubs or gallops.   Respiratory:  Respirations regular and unlabored. Clear to auscultation without rales, wheezing or rhonchi. GI: Soft, nontender, nondistended. Extremities: Radials/DP/PT 2+ and equal bilaterally. No clubbing or cyanosis. No edema.  Skin: Warm and dry, no rash. Neuro: Strength intact.  Assessment & Plan      Coronary Artery Disease/Atypical chest pain LHC July 2015 showed moderate nonobstructive CAD.  Coronary CTA February 2024 showed calcium score 387 with severe ostial RCA stenosis found to be significant by FFR and LHC was recommended.  Patient opted for medical management.  She was doing well on Toprol and Imdur.  Recently experiencing increased episodes of chest discomfort. Miscommunication led to discontinuation of Metoprolol instead of isosorbide secondary to complaints of rash. She reports persistent nonexertional chest pain  despite Isosorbide mononitrate. She would still like to pursue conservative management right now. Chest pain has atypical features as it is fairly constant and does not increase with exertion.  -Resume Metoprolol 25mg  at night. -Continue Isosorbide mononitrate, aspirin, rosuvastatin and Zetia. -Contact office in 1 week to assess response to Metoprolol. If no improvement, consider starting Amlodipine.  Hypertension BP today 152/83 on intake and 142/80 on my recheck. She denies headaches or dizziness.  -Continue Losartan 100mg  in the morning. -Restart Metoprolol.   Hyperlipidemia LDL July 2024 81, not at goal.  Weldon Picking was added in August 2024.  -Continue rosuvastatin and Zetia.  Gastroesophageal Reflux Disease Persistent symptoms despite Pantoprazole and Famotidine. She is concerned she may have gallbladder problems, as her granddaughter had similar symptoms. She is going to speak to her PCP.  -Continue Pantoprazole and Famotidine. -Consider evaluation by primary care provider for possible  gallbladder disease.     Disposition: Return for previously scheduled visit with Dr. Herbie Baltimore in December or sooner as needed. She will contact the office in a week to report response to Toprol.          Signed, Etta Grandchild. Thalia Turkington, DNP, NP-C

## 2023-10-17 NOTE — Telephone Encounter (Signed)
Follow Up:     Patient is returning a call from today. 

## 2023-10-17 NOTE — Telephone Encounter (Signed)
Called patient and informed her of the following from Dr. Herbie Baltimore.  If she did not tolerate Imdur, we can probably start with amlodipine 2.5 mg daily and also refill her as needed NTG.   Very interesting that it does not seem to be worse with exertion.  If there is some spasm component the amlodipine will help.     Bryan Lemma, MD    Patient verbalizes understanding. Prescriptions sent to preferred pharmacy.

## 2023-10-17 NOTE — Telephone Encounter (Signed)
Left a message for the patient to call back.  

## 2023-10-18 ENCOUNTER — Ambulatory Visit: Payer: Medicare Other | Attending: Student | Admitting: Student

## 2023-10-18 ENCOUNTER — Encounter: Payer: Self-pay | Admitting: Student

## 2023-10-18 VITALS — BP 142/80 | HR 86 | Ht 63.0 in | Wt 165.4 lb

## 2023-10-18 DIAGNOSIS — R0789 Other chest pain: Secondary | ICD-10-CM | POA: Diagnosis not present

## 2023-10-18 DIAGNOSIS — I25119 Atherosclerotic heart disease of native coronary artery with unspecified angina pectoris: Secondary | ICD-10-CM

## 2023-10-18 DIAGNOSIS — K219 Gastro-esophageal reflux disease without esophagitis: Secondary | ICD-10-CM | POA: Diagnosis not present

## 2023-10-18 DIAGNOSIS — I10 Essential (primary) hypertension: Secondary | ICD-10-CM

## 2023-10-18 DIAGNOSIS — E785 Hyperlipidemia, unspecified: Secondary | ICD-10-CM | POA: Diagnosis not present

## 2023-10-18 NOTE — Patient Instructions (Signed)
Medication Instructions:  RESTART the Metoprolol (Toprol) 25 mg once daily  *If you need a refill on your cardiac medications before your next appointment, please call your pharmacy*   Lab Work: None ordered If you have labs (blood work) drawn today and your tests are completely normal, you will receive your results only by: MyChart Message (if you have MyChart) OR A paper copy in the mail If you have any lab test that is abnormal or we need to change your treatment, we will call you to review the results.   Testing/Procedures: None ordered   Follow-Up: At [facility], you and your health needs are our priority.  As part of our continuing mission to provide you with exceptional heart care, we have created designated Provider Care Teams.  These Care Teams include your primary Cardiologist (physician) and Advanced Practice Providers (APPs -  Physician Assistants and Nurse Practitioners) who all work together to provide you with the care you need, when you need it.  We recommend signing up for the patient portal called "MyChart".  Sign up information is provided on this After Visit Summary.  MyChart is used to connect with patients for Virtual Visits (Telemedicine).  Patients are able to view lab/test results, encounter notes, upcoming appointments, etc.  Non-urgent messages can be sent to your provider as well.   To learn more about what you can do with MyChart, go to [email or web address].    Your next appointment:   Keep follow up as scheduled

## 2023-10-19 ENCOUNTER — Encounter: Payer: Self-pay | Admitting: Student

## 2023-10-21 ENCOUNTER — Other Ambulatory Visit (INDEPENDENT_AMBULATORY_CARE_PROVIDER_SITE_OTHER): Payer: Medicare Other

## 2023-10-21 ENCOUNTER — Telehealth (INDEPENDENT_AMBULATORY_CARE_PROVIDER_SITE_OTHER): Payer: Medicare Other | Admitting: Family Medicine

## 2023-10-21 DIAGNOSIS — E039 Hypothyroidism, unspecified: Secondary | ICD-10-CM

## 2023-10-21 DIAGNOSIS — M5451 Vertebrogenic low back pain: Secondary | ICD-10-CM | POA: Diagnosis not present

## 2023-10-21 DIAGNOSIS — E785 Hyperlipidemia, unspecified: Secondary | ICD-10-CM

## 2023-10-21 DIAGNOSIS — M79605 Pain in left leg: Secondary | ICD-10-CM | POA: Diagnosis not present

## 2023-10-21 LAB — LIPID PANEL
Cholesterol: 95 mg/dL (ref 0–200)
HDL: 37.7 mg/dL — ABNORMAL LOW (ref >39.00)
LDL Cholesterol: 42 mg/dL (ref 0–99)
NonHDL: 57.48
Total CHOL/HDL Ratio: 3
Triglycerides: 78 mg/dL (ref 0.0–149.0)
VLDL: 15.6 mg/dL (ref 0.0–40.0)

## 2023-10-21 LAB — COMPREHENSIVE METABOLIC PANEL
ALT: 28 U/L (ref 0–35)
AST: 25 U/L (ref 0–37)
Albumin: 3.7 g/dL (ref 3.5–5.2)
Alkaline Phosphatase: 72 U/L (ref 39–117)
BUN: 15 mg/dL (ref 6–23)
CO2: 31 meq/L (ref 19–32)
Calcium: 9.1 mg/dL (ref 8.4–10.5)
Chloride: 104 meq/L (ref 96–112)
Creatinine, Ser: 1.06 mg/dL (ref 0.40–1.20)
GFR: 48.03 mL/min — ABNORMAL LOW (ref >60.00)
Glucose, Bld: 95 mg/dL (ref 70–99)
Potassium: 4.7 meq/L (ref 3.5–5.1)
Sodium: 142 meq/L (ref 135–145)
Total Bilirubin: 0.6 mg/dL (ref 0.2–1.2)
Total Protein: 5.8 g/dL — ABNORMAL LOW (ref 6.0–8.3)

## 2023-10-21 LAB — T4, FREE: Free T4: 0.66 ng/dL (ref 0.60–1.60)

## 2023-10-21 LAB — T3, FREE: T3, Free: 2.5 pg/mL (ref 2.3–4.2)

## 2023-10-21 LAB — TSH: TSH: 10.18 u[IU]/mL — ABNORMAL HIGH (ref 0.35–5.50)

## 2023-10-21 NOTE — Telephone Encounter (Signed)
Orders placed for CMET and Lipid.

## 2023-10-21 NOTE — Telephone Encounter (Signed)
Patient was in this morning for her thyroid blood work said her Cardiologist added another pill for her cholesterol in Aug. And has not had it checked wants to know if you can add it to her blood work.   Thanks

## 2023-10-23 DIAGNOSIS — M79605 Pain in left leg: Secondary | ICD-10-CM | POA: Diagnosis not present

## 2023-10-23 DIAGNOSIS — M5451 Vertebrogenic low back pain: Secondary | ICD-10-CM | POA: Diagnosis not present

## 2023-10-28 DIAGNOSIS — M5451 Vertebrogenic low back pain: Secondary | ICD-10-CM | POA: Diagnosis not present

## 2023-10-28 DIAGNOSIS — M79605 Pain in left leg: Secondary | ICD-10-CM | POA: Diagnosis not present

## 2023-10-30 DIAGNOSIS — M5451 Vertebrogenic low back pain: Secondary | ICD-10-CM | POA: Diagnosis not present

## 2023-10-30 DIAGNOSIS — M79605 Pain in left leg: Secondary | ICD-10-CM | POA: Diagnosis not present

## 2023-11-04 ENCOUNTER — Ambulatory Visit: Payer: Medicare Other | Admitting: Obstetrics & Gynecology

## 2023-11-04 ENCOUNTER — Encounter: Payer: Self-pay | Admitting: Obstetrics & Gynecology

## 2023-11-04 VITALS — BP 138/76 | HR 63

## 2023-11-04 DIAGNOSIS — N8110 Cystocele, unspecified: Secondary | ICD-10-CM

## 2023-11-04 DIAGNOSIS — Z4689 Encounter for fitting and adjustment of other specified devices: Secondary | ICD-10-CM

## 2023-11-04 NOTE — Progress Notes (Addendum)
   GYNECOLOGY OFFICE VISIT NOTE  History:   Kristina Carlson is a 87 y.o. [phone] female with stage II pelvic organ prolapse who presents for pessary maintenance.  She has been using a size #4 ring with support pessary. The pessary has been working well, no complaints.  She denies any abnormal vaginal discharge, bleeding, pelvic pain or other concerns.    Past Medical History:  Diagnosis Date   Coronary artery disease involving native coronary artery with angina pectoris (HCC) 06/2014   CARDIAC CATH 06/2014: Mild LAD 50%, OM 1 30%, 50% ostial RCA:;; Coronary CTA February 2024 shows severe ostial RCA disease (FFR ct +0.76).  Mild proximal LAD and LCx stenosis.   Dysplastic nevus 01/24/2023   Right chest. Severe atypia, deep margin involved. Excised 03/06/23   Hyperlipidemia    Hypertension    Hypothyroid    Insomnia    Macular degeneration    -- Dr. Rudell Rue (optometrist).  Taking Ocuvite.    Past Surgical History:  Procedure Laterality Date   Cardiac CTA  01/25/2023   Coronary Calcium  Score 384.  Severe (70 to 99%) ostial RCA=> FFR ct 0.76 (significant).  Mild proximal LAD and LCx stenosis (25 to 49%).   LEFT HEART CATHETERIZATION WITH CORONARY ANGIOGRAM N/A 06/29/2014   Procedure: LEFT HEART CATHETERIZATION WITH CORONARY ANGIOGRAM;  Surgeon: Victory LELON Claudene DOUGLAS, MD;  Location: [facility] C[facility] LAB;  Service: Cardiovascular;: Mild LAD 50%, OM 1 30%, 50% ostial RCA:   The following portions of the patient's history were reviewed and updated as appropriate: allergies, current medications, past family history, past medical history, past social history, past surgical history and problem list.   Review of Systems:  Pertinent items noted in HPI and remainder of comprehensive ROS otherwise negative.  Physical Exam:  BP 138/76   Pulse 63  CONSTITUTIONAL: Well-developed, well-nourished female in no acute distress.  MUSCULOSKELETAL: Normal range of motion. No edema noted. NEUROLOGIC: Alert and  oriented to person, place, and time. Normal muscle tone coordination. No cranial nerve deficit noted. PSYCHIATRIC: Normal mood and affect. Normal behavior. Normal judgment and thought content. CARDIOVASCULAR: Normal heart rate noted RESPIRATORY: Effort and breath sounds normal, no problems with respiration noted ABDOMEN: No masses noted. No other overt distention noted.   PELVIC: Normal external female genitalia with moderate atrophy; Urethral meatus normal in appearance, no urethral masses or discharge. The pessary was noted to be in place. It was removed and cleaned. Exam revealed no lesions in the vagina. The pessary was replaced. It was comfortable to the patient and fit well.  Examination done in presence of RN as the chaperone.     Assessment and Plan:     1. Stage II Pelvic Organ Prolapse 2. Pessary maintenance - Continue size #4 ring with support pessary - Follow up 3 months for pessary check  Routine preventative health maintenance measures emphasized, follow up with PCP regarding other medical issues. Please refer to After Visit Summary for other counseling recommendations.   Return in about 3 months (around 02/04/2024) for Pessary maintenance.    I spent 20 minutes dedicated to the care of this patient including pre-visit review of records, face to face time with the patient discussing her conditions and treatments and post visit orders.   GLORIS HUGGER, MD, FACOG Obstetrician & Gynecologist, [facility] for [employer], [facility]

## 2023-11-05 ENCOUNTER — Other Ambulatory Visit: Payer: Self-pay | Admitting: Cardiology

## 2023-11-17 NOTE — Progress Notes (Unsigned)
Cardiology Clinic Note   Date: 11/20/2023 ID: NIKOLLE TANKS, DOB May 13, 1936, MRN [id number]  Primary Cardiologist:  Bryan Lemma, MD  Patient Profile    Kristina Carlson is a 87 y.o. female who presents to the clinic today for routine follow up.     Past medical history significant for: CAD. LHC 06/29/2014 (chest pain): Widely patent coronary arteries with 40 to 50% ostial RCA and up to 50% mid LAD.  Ostial LCx <30%.  Normal LV function. Coronary CTA with FFR 01/25/2023: Coronary calcium score 387 (69th percentile).  Severe ostial RCA stenosis > 70%.  Mild proximal LAD and LCx stenosis 25 to 49%.  FFR showed significant ostial RCA stenosis.  Recommend cardiac catheterization. Hypertension. Hyperlipidemia. Lipid panel 10/21/2023: LDL 42, HDL 38, TG 78, total 95. GERD. Hypothyroidism.  In summary, patient underwent hospital admission in July 2015 for chest pain.  LHC showed widely patent coronary arteries with 40 to 50% ostial RCA and up to 50% mid LAD, <30% ostial LCx.  She was seen virtually by Dr. Katrinka Blazing on 04/21/2019 with complaints of similar chest pain.  She had recently been evaluated in the ED with normal EKG and unremarkable cardiac markers.  It was felt her symptoms were musculoskeletal/nonischemic and she was trialed on ibuprofen 400 mg twice daily x 4 doses.  She was also provided with an Rx for as needed SL NTG.  She was instructed to contact the office with persistent discomfort.    Patient was first seen by Dr. Herbie Baltimore on 01/10/2023 for evaluation of chest pain at the request of Dr. Patsy Lager.  Patient described a month-long history of intermittent episodic chest pain starting at the center and radiating to both sides.  Pain usually worse with lying flat or on her left side and better if she lifts the head of the bed or sits up.  It is not associated with exertion.  Coronary CTA with FFR showed significant ostial RCA stenosis and heart catheterization was recommended.  Upon follow-up she  opted for medical management and close follow-up.  She was started on isosorbide and Toprol.  On follow-up April 2024 she reported less frequent chest discomfort and shortness of breath.  Unfortunately, on follow-up in August 2024 patient reported developing a rash on her chest and face from isosorbide so it was discontinued.     History of Present Illness    Kristina Carlson is followed by Dr. Herbie Baltimore for the above outlined history.   Patient was last seen in the office by me on 10/18/2023 for evaluation of chest pain.  She reported since stopping metoprolol she developed persistent chest pain and a burning sensation for about 2 weeks.  She reported discomfort was constant and located across center of her chest radiating to left arm.  Pain is not related to physical activity or specific movements.  It was discovered that due to a misunderstanding patient actually stopped metoprolol and continued isosorbide.  She reported rash improved somewhat but did not resolve completely.  She had contacted the office and was instructed to start amlodipine but she had not done that at the time of her visit.  It was decided to have patient restart metoprolol.  She was instructed to contact the office and 1 week and if no improvement to chest discomfort start amlodipine.  Patient also mentioned being concerned about gallbladder problems and was encouraged to follow-up with PCP who had started pantoprazole and famotidine.  Today, patient presents alone for follow up after medication  changes. She reports less chest discomfort in the last 2 weeks since starting back on Toprol. She has not started on amlodipine. She is not very active but does not notice the discomfort when she performs home PT exercises or walks. It typically comes on when she is in bed and she has to sit up against the back of the bed until it passes. The discomfort was unresponsive to pantoprazole and famotidine. She is hypertensive today. She does not check  her BP at home but will spot check it when she is at [facility]. No report of dizziness or headaches.        ROS: All other systems reviewed and are otherwise negative except as noted in History of Present Illness.  Studies Reviewed    EKG not ordered today.   Risk Assessment/Calculations      HYPERTENSION CONTROL Vitals:   11/20/23 1114 11/20/23 1202  BP: (!) 180/80 (!) 170/82    The patient's blood pressure is elevated above target today.  In order to address the patient's elevated BP: A new medication was prescribed today.           Physical Exam    VS:  BP (!) 170/82 (BP Location: Left Arm, Patient Position: Sitting, Cuff Size: Normal)   Pulse (!) 56   Ht 5\' 3"  (1.6 m)   Wt 166 lb (75.3 kg)   SpO2 96%   BMI 29.41 kg/m  , BMI Body mass index is 29.41 kg/m.  GEN: Well nourished, well developed, in no acute distress. Neck: No JVD or carotid bruits. Cardiac:  RRR. No murmurs. No rubs or gallops.   Respiratory:  Respirations regular and unlabored. Clear to auscultation without rales, wheezing or rhonchi. GI: Soft, nontender, nondistended. Extremities: Radials/DP/PT 2+ and equal bilaterally. No clubbing or cyanosis. No edema.  Skin: Warm and dry, no rash. Neuro: Strength intact.  Assessment & Plan   Coronary Artery Disease/Atypical chest pain LHC July 2015 showed moderate nonobstructive CAD.  Coronary CTA February 2024 showed calcium score 387 with severe ostial RCA stenosis found to be significant by FFR and LHC was recommended.  Patient opted for medical management.  Patient reports improved chest pain for the last 2 weeks since starting metoprolol but not resolved. She has not started amlodipine. She would like to try the amlodipine before considering LHC.  -Continue Isosorbide mononitrate, metoprolol, aspirin, rosuvastatin and Zetia. -Start amlodipine.    Hypertension BP today 180/80 on intake and 170/82 on my recheck. [facility] typically in the 140s/80s.  She denies headaches or dizziness.  -Continue Losartan 100mg  in the morning and metoprolol in the evening.  -Start amlodipine 2.5 mg. -Will consider changing Losartan if still hypertensive at next visit.  -Instructed patient to bring log of BP checks from [facility].    Hyperlipidemia LDL October 2024 42, at goal. -Continue rosuvastatin and Zetia.     Disposition: Start amlodipine 2.5 mg daily. Return in 10-12 weeks or sooner as needed.          Signed, Etta Grandchild. Arath Kaigler, DNP, NP-C

## 2023-11-20 ENCOUNTER — Encounter: Payer: Self-pay | Admitting: Student

## 2023-11-20 ENCOUNTER — Ambulatory Visit: Payer: Medicare Other | Attending: Student | Admitting: Student

## 2023-11-20 VITALS — BP 170/82 | HR 56 | Ht 63.0 in | Wt 166.0 lb

## 2023-11-20 DIAGNOSIS — I10 Essential (primary) hypertension: Secondary | ICD-10-CM

## 2023-11-20 DIAGNOSIS — R0789 Other chest pain: Secondary | ICD-10-CM | POA: Diagnosis not present

## 2023-11-20 DIAGNOSIS — I25118 Atherosclerotic heart disease of native coronary artery with other forms of angina pectoris: Secondary | ICD-10-CM

## 2023-11-20 DIAGNOSIS — E785 Hyperlipidemia, unspecified: Secondary | ICD-10-CM | POA: Diagnosis not present

## 2023-11-20 NOTE — Patient Instructions (Signed)
Medication Instructions:  - No changes *If you need a refill on your cardiac medications before your next appointment, please call your pharmacy*  Lab Work: - None ordered  Testing/Procedures: - None ordered  Follow-Up: At [facility], you and your health needs are our priority.  As part of our continuing mission to provide you with exceptional heart care, we have created designated Provider Care Teams.  These Care Teams include your primary Cardiologist (physician) and Advanced Practice Providers (APPs -  Physician Assistants and Nurse Practitioners) who all work together to provide you with the care you need, when you need it.  Your next appointment:   6 - 8 week(s)  Provider:   Carlos Levering, NP    Other Instructions - None

## 2023-12-03 DIAGNOSIS — H04123 Dry eye syndrome of bilateral lacrimal glands: Secondary | ICD-10-CM | POA: Diagnosis not present

## 2023-12-03 DIAGNOSIS — H43813 Vitreous degeneration, bilateral: Secondary | ICD-10-CM | POA: Diagnosis not present

## 2023-12-03 DIAGNOSIS — H353134 Nonexudative age-related macular degeneration, bilateral, advanced atrophic with subfoveal involvement: Secondary | ICD-10-CM | POA: Diagnosis not present

## 2023-12-05 ENCOUNTER — Ambulatory Visit: Payer: Medicare Other | Admitting: Cardiology

## 2024-01-04 NOTE — Progress Notes (Signed)
 Cardiology Clinic Note   Date: 01/07/2024 ID: DELA SWEENY, DOB 1936-08-17, MRN [id number]  Primary Cardiologist:  Alm Clay, MD  Patient Profile    Kristina Carlson is a 88 y.o. female who presents to the clinic today for routine follow up.     Past medical history significant for: CAD. LHC 06/29/2014 (chest pain): Widely patent coronary arteries with 40 to 50% ostial RCA and up to 50% mid LAD.  Ostial LCx <30%.  Normal LV function. Coronary CTA with FFR 01/25/2023: Coronary calcium  score 387 (69th percentile).  Severe ostial RCA stenosis > 70%.  Mild proximal LAD and LCx stenosis 25 to 49%.  FFR showed significant ostial RCA stenosis.  Recommend cardiac catheterization. Hypertension. Hyperlipidemia. Lipid panel 10/21/2023: LDL 42, HDL 38, TG 78, total 95. GERD. Hypothyroidism.  In summary, patient underwent hospital admission in July 2015 for chest pain.  LHC showed widely patent coronary arteries with 40 to 50% ostial RCA and up to 50% mid LAD, <30% ostial LCx.  She was seen virtually by Dr. Claudene on 04/21/2019 with complaints of similar chest pain.  She had recently been evaluated in the ED with normal EKG and unremarkable cardiac markers.  It was felt her symptoms were musculoskeletal/nonischemic and she was trialed on ibuprofen 400 mg twice daily x 4 doses.  She was also provided with an Rx for as needed SL NTG.  She was instructed to contact the office with persistent discomfort.    Patient was first seen by Dr. Clay on 01/10/2023 for evaluation of chest pain at the request of Dr. Watt.  Patient described a month-long history of intermittent episodic chest pain starting at the center and radiating to both sides.  Pain usually worse with lying flat or on her left side and better if she lifts the head of the bed or sits up.  It is not associated with exertion.  Coronary CTA with FFR showed significant ostial RCA stenosis and heart catheterization was recommended.  Upon follow-up she  opted for medical management and close follow-up.  She was started on isosorbide  and Toprol .  On follow-up April 2024 she reported less frequent chest discomfort and shortness of breath.  Unfortunately, on follow-up in August 2024 patient reported developing a rash on her chest and face from isosorbide  so it was discontinued.   Patient was seen in follow-up in October 2024 for evaluation of chest pain.  She reported since stopping metoprolol  she developed persistent chest pain and a burning sensation for about 2 weeks. She reported discomfort was constant and located across center of her chest radiating to left arm. Pain is not related to physical activity or specific movements. It was discovered that due to a misunderstanding patient actually stopped metoprolol  and continued isosorbide . She reported rash improved somewhat but did not resolve completely. She had contacted the office and was instructed to start amlodipine  but she had not done that at the time of her visit. It was decided to have patient restart metoprolol . She was instructed to contact the office and 1 week and if no improvement to chest discomfort start amlodipine . Patient also mentioned being concerned about gallbladder problems and was encouraged to follow-up with PCP who had started pantoprazole  and famotidine .      History of Present Illness    Kristina Carlson is followed by Dr. Clay for the above outlined history.  Patient was last seen in the office by me on 11/20/2023 to follow-up after medication changes.  She reported  less chest discomfort since starting back on Toprol .  She had not started on amlodipine .  She was hypertensive at the time of her visit.  She was instructed [facility] as she was not able to check BP at home secondary to difficulty getting cuff on.  She was started on low-dose amlodipine . Discussed LHC with patient at she opted to try amlodipine  first.    Today, patient reports she is doing well. She  will have very occasional episodes of chest pain if she lays on her right side at night but otherwise she is not having the pain as she had before. She reports she stopped taking Toprol  and amlodipine  at the start of January because her BP dropped to 99/50. She denies lightheadedness or dizziness with this low reading but states her head felt funny. She also continues to have a rash across her chest. She is able to perform light duties around her home without pain. Her activity is limited secondary to back pain.      ROS: All other systems reviewed and are otherwise negative except as noted in History of Present Illness.  EKGs/Labs Reviewed        10/21/2023: ALT 28; AST 25; BUN 15; Creatinine, Ser 1.06; Potassium 4.7; Sodium 142   10/21/2023: TSH 10.18    Physical Exam    VS:  BP (!) 142/78 (BP Location: Left Arm, Patient Position: Sitting, Cuff Size: Normal)   Pulse 80   Ht 5' 3 (1.6 m)   Wt 167 lb 8 oz (76 kg)   SpO2 96%   BMI 29.67 kg/m  , BMI Body mass index is 29.67 kg/m.  GEN: Well nourished, well developed, in no acute distress. Neck: No JVD or carotid bruits. Cardiac:  RRR. No murmurs. No rubs or gallops.   Respiratory:  Respirations regular and unlabored. Clear to auscultation without rales, wheezing or rhonchi. GI: Soft, nontender, nondistended. Extremities: Radials/DP/PT 2+ and equal bilaterally. No clubbing or cyanosis. No edema.  Skin: Warm and dry, no rash. Neuro: Strength intact.  Assessment & Plan   Coronary Artery Disease LHC July 2015 showed moderate nonobstructive CAD.  Coronary CTA February 2024 showed calcium  score 387 with severe ostial RCA stenosis found to be significant by FFR and LHC was recommended.  Patient opted for medical management.  Patient reports very occasional episodes of chest pain when she lays on her right side but otherwise no further chest pain. She stopped Toprol  and amlodipine  at the beginning of January because her BP dropped to  99/50. She also had some readings around 110/60. She also continues to have a rash and believes it is caused by isosorbide . She will stop isosorbide  for the next 2 weeks to see if rash resolves. If/when she starts back on it she will take a half tablet (15 mg). She is instructed to take 1/2 tablet of Toprol  and resume a full tablet of amlodipine . Now that she is not having chest pain, we will continue medical management.  -Continue aspirin , rosuvastatin  and Zetia . -Resume Toprol  1/2 tablet daily and amlodipine . -Stop isosorbide  for 2 weeks to see if rash resolves. If/when she resumes will start back on a half tablet.     Hypertension BP today 142/78.  Home BP has been well controlled. She had a few readings that were on the lower side. She stopped Toprol  and amlodipine  at the beginning of January. -Continue Losartan  100mg  in the morning and resume metoprolol  at 1/2 tablet (12.5 mg) in  the evening.  -Continue amlodipine  2.5 mg.   Hyperlipidemia LDL October 2024 42, at goal. -Continue rosuvastatin  and Zetia .  Disposition: Resume Toprol  12.5 mg daily and amlodipine  2.5 mg daily. Stop isosorbide  for 2 weeks. If/when she resumes take 15 mg daily. Patient would like to see Dr. Anner soon, as she has not seen him in several months. She will return in 1-2 months or sooner as needed.          Signed, Barnie HERO. Cecylia Brazill, DNP, NP-C

## 2024-01-05 ENCOUNTER — Other Ambulatory Visit: Payer: Self-pay | Admitting: Family Medicine

## 2024-01-07 ENCOUNTER — Encounter: Payer: Self-pay | Admitting: Student

## 2024-01-07 ENCOUNTER — Ambulatory Visit: Payer: Medicare Other | Attending: Student | Admitting: Student

## 2024-01-07 VITALS — BP 142/78 | HR 80 | Ht 63.0 in | Wt 167.5 lb

## 2024-01-07 DIAGNOSIS — E785 Hyperlipidemia, unspecified: Secondary | ICD-10-CM

## 2024-01-07 DIAGNOSIS — I10 Essential (primary) hypertension: Secondary | ICD-10-CM

## 2024-01-07 DIAGNOSIS — I2510 Atherosclerotic heart disease of native coronary artery without angina pectoris: Secondary | ICD-10-CM

## 2024-01-07 MED ORDER — METOPROLOL SUCCINATE ER 25 MG PO TB24: 12.5000 mg | ORAL_TABLET | Freq: Every day | ORAL | Status: DC

## 2024-01-07 NOTE — Patient Instructions (Signed)
 Medication Instructions:   DECREASE Metoprolol  - Take half tablet ( 12.5mg ) by mouth daily.   You may take a 2 week holiday on your Imdur  - please call and let us  know if you will be resuming it after the 2 weeks. If you do resume it you can resume at half tablet ( 15mg ) daily.   *If you need a refill on your cardiac medications before your next appointment, please call your pharmacy*   Lab Work:  None Ordered  If you have labs (blood work) drawn today and your tests are completely normal, you will receive your results only by: MyChart Message (if you have MyChart) OR A paper copy in the mail If you have any lab test that is abnormal or we need to change your treatment, we will call you to review the results.   Testing/Procedures:  None Ordered    Follow-Up: At [facility], you and your health needs are our priority.  As part of our continuing mission to provide you with exceptional heart care, we have created designated Provider Care Teams.  These Care Teams include your primary Cardiologist (physician) and Advanced Practice Providers (APPs -  Physician Assistants and Nurse Practitioners) who all work together to provide you with the care you need, when you need it.  We recommend signing up for the patient portal called MyChart.  Sign up information is provided on this After Visit Summary.  MyChart is used to connect with patients for Virtual Visits (Telemedicine).  Patients are able to view lab/test results, encounter notes, upcoming appointments, etc.  Non-urgent messages can be sent to your provider as well.   To learn more about what you can do with MyChart, go to [email or web address].    Your next appointment:   1 - 2 month(s)  Provider:   Alm Clay, MD ONLY

## 2024-01-30 ENCOUNTER — Ambulatory Visit: Payer: Medicare Other | Admitting: Dermatology

## 2024-01-30 ENCOUNTER — Other Ambulatory Visit: Payer: Self-pay | Admitting: Cardiology

## 2024-01-30 ENCOUNTER — Encounter: Payer: Self-pay | Admitting: Dermatology

## 2024-01-30 DIAGNOSIS — R202 Paresthesia of skin: Secondary | ICD-10-CM

## 2024-01-30 DIAGNOSIS — R208 Other disturbances of skin sensation: Secondary | ICD-10-CM

## 2024-01-30 NOTE — Progress Notes (Signed)
   Follow-Up Visit   Subjective  Kristina Carlson is a 88 y.o. female who presents for the following: Rash chest, L back, face has had ~31m, chest, 3wks L back, itchy, painful, using [facility] 0.1% cr, new meds ~01/2023 Metoprolol , Imdur , no hx of direct heat on back, pt declines TBSE today The patient has spots, moles and lesions to be evaluated, some may be new or changing and the patient may have concern these could be cancer.   The following portions of the chart were reviewed this encounter and updated as appropriate: medications, allergies, medical history  Review of Systems:  No other skin or systemic complaints except as noted in HPI or Assessment and Plan.  Objective  Well appearing patient in no apparent distress; mood and affect are within normal limits.   A focused examination was performed of the following areas: Face, chest, back  Relevant exam findings are noted in the Assessment and Plan.  chest, back Livedo reticularis (reticular erythema that blanches) back, slight erythematous patch chest   Assessment & Plan   NOTALGIA PARESTHETICA L upper back Exam: no focal lesions that would explain symptoms Chronic condition without cure secondary to pinched nerve along spine causing itching or sensation changes in an area of skin. Chronic rubbing or scratching causes darkening of the skin.  OTC treatments which can help with itch include numbing creams like pramoxine or lidocaine  which temporarily reduce itch or Capsaicin-containing creams which cause a burning sensation but which sometimes over time will reset the nerves to stop producing itch.  If you choose to use Capsaicin cream, it is recommended to use it 5 times daily for 1 week followed by 3 times daily for 3-6 weeks. You may have to continue using it long-term.  If not doing well with OTC options, could consider [employer] compounded prescription anti-itch cream with Amitriptyline 5% / Lidocaine  5% / Pramoxine 1% or  Amitriptyline 5% / Gabapentin 10% / Lidocaine  5% Cream or other prescription cream or pill options.    Discussed otc Lidocaine  patch daily PRN Other options are Gabapentin, referral to neurology per patient preference Patient will return for biopsy if desired NOTALGIA PARESTHETICA   DYSESTHESIA chest, back Discussed punch bx pt declines  Return if symptoms worsen or fail to improve, for pt can reschedule TBSE since she declined today.  I, Grayce Saunas, RMA, am acting as scribe for Boneta Sharps, MD .   Documentation: I have reviewed the above documentation for accuracy and completeness, and I agree with the above.  Boneta Sharps, MD

## 2024-01-30 NOTE — Patient Instructions (Addendum)
 Over the counter Lidocaine  patch for back    Due to recent changes in healthcare laws, you may see results of your pathology and/or laboratory studies on MyChart before the doctors have had a chance to review them. We understand that in some cases there may be results that are confusing or concerning to you. Please understand that not all results are received at the same time and often the doctors may need to interpret multiple results in order to provide you with the best plan of care or course of treatment. Therefore, we ask that you please give us  2 business days to thoroughly review all your results before contacting the office for clarification. Should we see a critical lab result, you will be contacted sooner.   If You Need Anything After Your Visit  If you have any questions or concerns for your doctor, please call our main line at [phone] and press option 4 to reach your doctor's medical assistant. If no one answers, please leave a voicemail as directed and we will return your call as soon as possible. Messages left after 4 pm will be answered the following business day.   You may also send us  a message via MyChart. We typically respond to MyChart messages within 1-2 business days.  For prescription refills, please ask your pharmacy to contact our office. Our fax number is [phone].  If you have an urgent issue when the clinic is closed that cannot wait until the next business day, you can page your doctor at the number below.    Please note that while we do our best to be available for urgent issues outside of office hours, we are not available 24/7.   If you have an urgent issue and are unable to reach us , you may choose to seek medical care at your doctor's office, retail clinic, urgent care center, or emergency room.  If you have a medical emergency, please immediately call 911 or go to the emergency department.  Pager Numbers  - Dr. Hester: [phone]  - Dr.  Jackquline: [phone]  - Dr. Claudene: [phone]   In the event of inclement weather, please call our main line at [phone] for an update on the status of any delays or closures.  Dermatology Medication Tips: Please keep the boxes that topical medications come in in order to help keep track of the instructions about where and how to use these. Pharmacies typically print the medication instructions only on the boxes and not directly on the medication tubes.   If your medication is too expensive, please contact our office at [phone] option 4 or send us  a message through MyChart.   We are unable to tell what your co-pay for medications will be in advance as this is different depending on your insurance coverage. However, we may be able to find a substitute medication at lower cost or fill out paperwork to get insurance to cover a needed medication.   If a prior authorization is required to get your medication covered by your insurance company, please allow us  1-2 business days to complete this process.  Drug prices often vary depending on where the prescription is filled and some pharmacies may offer cheaper prices.  The website www.goodrx.com contains coupons for medications through different pharmacies. The prices here do not account for what the cost may be with help from insurance (it may be cheaper with your insurance), but the website can give you the price if you did not use any insurance.  -  You can print the associated coupon and take it with your prescription to the pharmacy.  - You may also stop by our office during regular business hours and pick up a GoodRx coupon card.  - If you need your prescription sent electronically to a different pharmacy, notify our office through [facility] or by phone at [phone] option 4.     Si Usted Necesita Algo Despus de Su Visita  Tambin puede enviarnos un mensaje a travs de Clinical Cytogeneticist. Por lo general respondemos a los  mensajes de MyChart en el transcurso de 1 a 2 das hbiles.  Para renovar recetas, por favor pida a su farmacia que se ponga en contacto con nuestra oficina. Randi lakes de fax es [place] [phone].  Si tiene un asunto urgente cuando la clnica est cerrada y que no puede esperar hasta el siguiente da hbil, puede llamar/localizar a su doctor(a) al nmero que aparece a continuacin.   Por favor, tenga en cuenta que aunque hacemos todo lo posible para estar disponibles para asuntos urgentes fuera del horario de [place], no estamos disponibles las 24 horas del da, los 7 [phone] de la [place].   Si tiene un problema urgente y no puede comunicarse con nosotros, puede optar por buscar atencin mdica  en el consultorio de su doctor(a), en una clnica privada, en un centro de atencin urgente o en una sala de emergencias.  Si tiene engineer, drilling, por favor llame inmediatamente al 911 o vaya a la sala de emergencias.  Nmeros de bper  - Dr. Hester: [phone]  - Dra. Jackquline: [phone]  - Dr. Claudene: [phone]   En caso de inclemencias del tiempo, por favor llame a landry capes principal al [phone] para una actualizacin sobre el [place] de cualquier retraso o cierre.  Consejos para la medicacin en dermatologa: Por favor, guarde las cajas en las que vienen los medicamentos de uso tpico para ayudarle a seguir las instrucciones sobre dnde y cmo usarlos. Las farmacias generalmente imprimen las instrucciones del medicamento slo en las cajas y no directamente en los tubos del [place].   Si su medicamento es muy caro, por favor, pngase en contacto con landry rieger llamando al [phone] y presione la opcin 4 o envenos un mensaje a travs de Clinical Cytogeneticist.   No podemos decirle cul ser su copago por los medicamentos por adelantado ya que esto es diferente dependiendo de la cobertura de su seguro. Sin embargo, es posible que podamos encontrar un medicamento sustituto a  audiological scientist un formulario para que el seguro cubra el medicamento que se considera necesario.   Si se requiere una autorizacin previa para que su compaa de seguros cubra su medicamento, por favor permtanos de 1 a 2 das hbiles para completar este proceso.  Los precios de los medicamentos varan con frecuencia dependiendo del environmental consultant de dnde se surte la receta y alguna farmacias pueden ofrecer precios ms baratos.  El sitio web www.goodrx.com tiene cupones para medicamentos de health and safety inspector. Los precios aqu no tienen en cuenta lo que podra costar con la ayuda del seguro (puede ser ms barato con su seguro), pero el sitio web puede darle el precio si no utiliz tourist information centre manager.  - Puede imprimir el cupn correspondiente y llevarlo con su receta a la farmacia.  - Tambin puede pasar por nuestra oficina durante el horario de atencin regular y education officer, museum una tarjeta de cupones de GoodRx.  - Si necesita que su receta se enve electrnicamente a [place],  informe a nuestra oficina a travs de MyChart de [place] o por telfono llamando al [phone] y presione la opcin 4.

## 2024-01-30 NOTE — Telephone Encounter (Signed)
 This is a Educational psychologist pt

## 2024-01-31 ENCOUNTER — Other Ambulatory Visit: Payer: Self-pay | Admitting: Family Medicine

## 2024-01-31 MED ORDER — PANTOPRAZOLE SODIUM 40 MG PO TBEC: 40.0000 mg | DELAYED_RELEASE_TABLET | Freq: Every day | ORAL | 3 refills | Status: DC

## 2024-01-31 MED ORDER — ONDANSETRON HCL 4 MG PO TABS: 4.0000 mg | ORAL_TABLET | Freq: Three times a day (TID) | ORAL | 0 refills | Status: DC | PRN

## 2024-01-31 NOTE — Progress Notes (Signed)
 Refills requested.

## 2024-02-03 ENCOUNTER — Encounter: Payer: Self-pay | Admitting: Obstetrics & Gynecology

## 2024-02-03 ENCOUNTER — Ambulatory Visit: Payer: Medicare Other | Admitting: Obstetrics & Gynecology

## 2024-02-03 VITALS — BP 144/80 | HR 71

## 2024-02-03 DIAGNOSIS — Z4689 Encounter for fitting and adjustment of other specified devices: Secondary | ICD-10-CM | POA: Diagnosis not present

## 2024-02-03 DIAGNOSIS — N8110 Cystocele, unspecified: Secondary | ICD-10-CM | POA: Diagnosis not present

## 2024-02-03 NOTE — Progress Notes (Addendum)
   GYNECOLOGY OFFICE VISIT NOTE  History:   Kristina Carlson is a 88 y.o. [phone] female with stage II pelvic organ prolapse who presents for pessary maintenance.  She has been using a size #4 ring with support pessary. The pessary has been working well, no complaints.  She denies any abnormal vaginal discharge, bleeding, pelvic pain or other concerns.    Past Medical History:  Diagnosis Date   Actinic keratosis    Coronary artery disease involving native coronary artery with angina pectoris (HCC) 06/2014   CARDIAC CATH 06/2014: Mild LAD 50%, OM 1 30%, 50% ostial RCA:;; Coronary CTA February 2024 shows severe ostial RCA disease (FFR ct +0.76).  Mild proximal LAD and LCx stenosis.   Dysplastic nevus 01/24/2023   Right chest. Severe atypia, deep margin involved. Excised 03/06/23   Hyperlipidemia    Hypertension    Hypothyroid    Insomnia    Macular degeneration    -- Dr. Rudell Rue (optometrist).  Taking Ocuvite.    Past Surgical History:  Procedure Laterality Date   Cardiac CTA  01/25/2023   Coronary Calcium  Score 384.  Severe (70 to 99%) ostial RCA=> FFR ct 0.76 (significant).  Mild proximal LAD and LCx stenosis (25 to 49%).   LEFT HEART CATHETERIZATION WITH CORONARY ANGIOGRAM N/A 06/29/2014   Procedure: LEFT HEART CATHETERIZATION WITH CORONARY ANGIOGRAM;  Surgeon: Victory LELON Claudene DOUGLAS, MD;  Location: [facility] CATH LAB;  Service: Cardiovascular;: Mild LAD 50%, OM 1 30%, 50% ostial RCA:   The following portions of the patient's history were reviewed and updated as appropriate: allergies, current medications, past family history, past medical history, past social history, past surgical history and problem list.   Review of Systems:  Pertinent items noted in HPI and remainder of comprehensive ROS otherwise negative.  Physical Exam:  BP (!) 144/80   Pulse 71  CONSTITUTIONAL: Well-developed, well-nourished female in no acute distress.  MUSCULOSKELETAL: Normal range of motion. No edema  noted. NEUROLOGIC: Alert and oriented to person, place, and time. Normal muscle tone coordination. No cranial nerve deficit noted. PSYCHIATRIC: Normal mood and affect. Normal behavior. Normal judgment and thought content. CARDIOVASCULAR: Normal heart rate noted RESPIRATORY: Effort and breath sounds normal, no problems with respiration noted ABDOMEN: No masses noted. No other overt distention noted.   PELVIC: Normal external female genitalia with moderate atrophy; Urethral meatus normal in appearance, no urethral masses or discharge. The pessary was noted to be in place. It was removed and cleaned. Exam revealed no lesions in the vagina. The pessary was replaced. It was comfortable to the patient and fit well.  Examination done in presence of RN as the chaperone.     Assessment and Plan:     1. Stage II Pelvic Organ Prolapse 2. Pessary maintenance - Continue size #4 ring with support pessary - Follow up 3 months for pessary check  Routine preventative health maintenance measures emphasized, follow up with PCP regarding other medical issues. Please refer to After Visit Summary for other counseling recommendations.   Return in about 3 months (around 05/02/2024) for Pessary Maintenance.    I spent 20 minutes dedicated to the care of this patient including pre-visit review of records, face to face time with the patient discussing her conditions and treatments and post visit orders.   GLORIS HUGGER, MD, FACOG Obstetrician & Gynecologist, [facility], [facility]  Patient here to have pessary cleaned.

## 2024-02-04 ENCOUNTER — Ambulatory Visit: Payer: Medicare Other | Admitting: Obstetrics & Gynecology

## 2024-02-13 ENCOUNTER — Ambulatory Visit: Payer: Medicare Other | Admitting: Cardiology

## 2024-02-19 ENCOUNTER — Ambulatory Visit: Payer: Medicare Other | Admitting: Family Medicine

## 2024-02-26 DIAGNOSIS — H04123 Dry eye syndrome of bilateral lacrimal glands: Secondary | ICD-10-CM | POA: Diagnosis not present

## 2024-02-26 DIAGNOSIS — H43813 Vitreous degeneration, bilateral: Secondary | ICD-10-CM | POA: Diagnosis not present

## 2024-02-26 DIAGNOSIS — H353134 Nonexudative age-related macular degeneration, bilateral, advanced atrophic with subfoveal involvement: Secondary | ICD-10-CM | POA: Diagnosis not present

## 2024-03-24 ENCOUNTER — Ambulatory Visit (INDEPENDENT_AMBULATORY_CARE_PROVIDER_SITE_OTHER): Admitting: Family Medicine

## 2024-03-24 ENCOUNTER — Encounter: Payer: Self-pay | Admitting: Family Medicine

## 2024-03-24 VITALS — BP 137/63 | HR 57 | Temp 98.0°F | Ht 63.5 in | Wt 170.2 lb

## 2024-03-24 DIAGNOSIS — M79604 Pain in right leg: Secondary | ICD-10-CM | POA: Diagnosis not present

## 2024-03-24 NOTE — Progress Notes (Unsigned)
 Patient ID: Kristina Carlson, female    DOB: 1936/07/15, 88 y.o.   MRN: [id number]  This visit was conducted in person.  BP 137/63 (BP Location: Left Arm, Patient Position: Sitting, Cuff Size: Normal)   Pulse (!) 57   Temp 98 F (36.7 C) (Temporal)   Ht 5' 3.5" (1.613 m)   Wt 170 lb 4 oz (77.2 kg)   SpO2 97%   BMI 29.69 kg/m    CC:  Chief Complaint  Patient presents with   Leg Pain    Right    Subjective:   HPI: Kristina Carlson is a 88 y.o. female presenting on 03/24/2024 for Leg Pain (Right)   Right leg pain.Marland Kitchen started after a dog scratches it last 08/2023... treated by Derm for staph infeciton... symptoms resolved until 3 weeks ago. Noted  pain over where scracth was.  PAin when up on it and at night, none sitting during the day.  No skin changes except slightly darker below sore area. Pain with putting weight on right foot.    Noted more swelling in last 2-3 days in bilateral ankles.  Feeling well otherwise, no fever, no flu like symptoms.   Has applied Vaseline   Had to drive which stresses her out... usually 127/55 on amlodipine, losartan, imdur, metoprolol BP Readings from Last 3 Encounters:  03/24/24 137/63  02/03/24 (!) 144/80  01/07/24 (!) 142/78      Relevant past medical, surgical, family and social history reviewed and updated as indicated. Interim medical history since our last visit reviewed. Allergies and medications reviewed and updated. Outpatient Medications Prior to Visit  Medication Sig Dispense Refill   amLODipine (NORVASC) 2.5 MG tablet Take 1 tablet (2.5 mg total) by mouth daily. 90 tablet 3   aspirin 81 MG EC tablet TAKE 1 TABLET (81 MG TOTAL) BY MOUTH DAILY. SWALLOW WHOLE. 90 tablet 3   Cholecalciferol (VITAMIN D3) 1000 units CAPS Take 1 Capful by mouth daily.     [employer] VITAMIN B12 1000 MCG tablet TAKE 1 TABLET BY MOUTH EVERY DAY 200 tablet 2   ezetimibe (ZETIA) 10 MG tablet Take 1 tablet (10 mg total) by mouth daily. 90 tablet 3   FIBER PO  Take by mouth daily. Takes gummies     isosorbide mononitrate (IMDUR) 30 MG 24 hr tablet TAKE 1 TABLET BY MOUTH AT BEDTIME. 90 tablet 0   levothyroxine (SYNTHROID) 75 MCG tablet TAKE 0.5 TABLETS BY MOUTH DAILY. 45 tablet 3   losartan (COZAAR) 100 MG tablet Take 1 tablet (100 mg total) by mouth daily. 90 tablet 3   metoprolol succinate (TOPROL-XL) 25 MG 24 hr tablet Take 25 mg by mouth daily.     Multiple Vitamins-Minerals (OCUVITE PRESERVISION) TABS Take 1 tablet by mouth 2 (two) times a day.     nitroGLYCERIN (NITROSTAT) 0.4 MG SL tablet Place 1 tablet (0.4 mg total) under the tongue every 5 (five) minutes as needed for chest pain. 25 tablet 3   Omega-3 Fatty Acids (FISH OIL OMEGA-3) 1000 MG CAPS Take 1 Capful by mouth daily.     ondansetron (ZOFRAN) 4 MG tablet Take 1 tablet (4 mg total) by mouth every 8 (eight) hours as needed. 60 tablet 0   pantoprazole (PROTONIX) 40 MG tablet Take 1 tablet (40 mg total) by mouth daily. 90 tablet 3   rosuvastatin (CRESTOR) 40 MG tablet Take 1 tablet (40 mg total) by mouth daily. 90 tablet 3   tobramycin (TOBREX) 0.3 % ophthalmic solution  SMARTSIG:In Eye(s)     triamcinolone cream (KENALOG) 0.5 % Apply 1 Application topically 2 (two) times daily. 30 g 0   metoprolol succinate (TOPROL-XL) 25 MG 24 hr tablet Take 0.5 tablets (12.5 mg total) by mouth at bedtime. (Patient taking differently: Take 25 mg by mouth at bedtime.)     No facility-administered medications prior to visit.     Per HPI unless specifically indicated in ROS section below Review of Systems  Constitutional:  Negative for fatigue and fever.  HENT:  Negative for congestion.   Eyes:  Negative for pain.  Respiratory:  Negative for cough and shortness of breath.   Cardiovascular:  Negative for chest pain, palpitations and leg swelling.  Gastrointestinal:  Negative for abdominal pain.  Genitourinary:  Negative for dysuria and vaginal bleeding.  Musculoskeletal:  Negative for back pain.   Neurological:  Negative for syncope, light-headedness and headaches.  Psychiatric/Behavioral:  Negative for dysphoric mood.    Objective:  BP 137/63 (BP Location: Left Arm, Patient Position: Sitting, Cuff Size: Normal)   Pulse (!) 57   Temp 98 F (36.7 C) (Temporal)   Ht 5' 3.5" (1.613 m)   Wt 170 lb 4 oz (77.2 kg)   SpO2 97%   BMI 29.69 kg/m   Wt Readings from Last 3 Encounters:  03/24/24 170 lb 4 oz (77.2 kg)  01/07/24 167 lb 8 oz (76 kg)  11/20/23 166 lb (75.3 kg)      Physical Exam Constitutional:      General: She is not in acute distress.    Appearance: Normal appearance. She is well-developed. She is not ill-appearing or toxic-appearing.  HENT:     Head: Normocephalic.     Right Ear: Hearing, tympanic membrane, ear canal and external ear normal. Tympanic membrane is not erythematous, retracted or bulging.     Left Ear: Hearing, tympanic membrane, ear canal and external ear normal. Tympanic membrane is not erythematous, retracted or bulging.     Nose: No mucosal edema or rhinorrhea.     Right Sinus: No maxillary sinus tenderness or frontal sinus tenderness.     Left Sinus: No maxillary sinus tenderness or frontal sinus tenderness.     Mouth/Throat:     Pharynx: Uvula midline.  Eyes:     General: Lids are normal. Lids are everted, no foreign bodies appreciated.     Conjunctiva/sclera: Conjunctivae normal.     Pupils: Pupils are equal, round, and reactive to light.  Neck:     Thyroid: No thyroid mass or thyromegaly.     Vascular: No carotid bruit.     Trachea: Trachea normal.  Cardiovascular:     Rate and Rhythm: Normal rate and regular rhythm.     Pulses: Normal pulses.     Heart sounds: Normal heart sounds, S1 normal and S2 normal. No murmur heard.    No friction rub. No gallop.  Pulmonary:     Effort: Pulmonary effort is normal. No tachypnea or respiratory distress.     Breath sounds: Normal breath sounds. No decreased breath sounds, wheezing, rhonchi or rales.   Abdominal:     General: Bowel sounds are normal.     Palpations: Abdomen is soft.     Tenderness: There is no abdominal tenderness.  Musculoskeletal:     Cervical back: Normal range of motion and neck supple.     Comments: No calf tenderness, area of soreness over anterior tibialis muscle but not to palpation only with weightbearing.  Skin:  General: Skin is warm and dry.     Findings: No rash.     Comments: Some hyperpigmentation over previous area of injury but no erythema, no swelling or fluctuance.  She does have spider veins below area of concern.  Neurological:     Mental Status: She is alert.  Psychiatric:        Mood and Affect: Mood is not anxious or depressed.        Speech: Speech normal.        Behavior: Behavior normal. Behavior is cooperative.        Thought Content: Thought content normal.        Judgment: Judgment normal.       Results for orders placed or performed in visit on 10/21/23  TSH   Collection Time: 10/21/23  8:48 AM  Result Value Ref Range   TSH 10.18 (H) 0.35 - 5.50 uIU/mL  T3, free   Collection Time: 10/21/23  8:48 AM  Result Value Ref Range   T3, Free 2.5 2.3 - 4.2 pg/mL  T4, free   Collection Time: 10/21/23  8:48 AM  Result Value Ref Range   Free T4 0.66 0.60 - 1.60 ng/dL    Assessment and Plan  Leg pain, anterior, right Assessment & Plan: Acute, no sign of cellulitis or abscess.  Area of soreness is only with active motion so suggest most likely musculoskeletal strain.  Less likely is pain from varicosities.  No evidence of DVT or superficial phlebitis. Treat with gentle range of motion exercises for her lower leg.  Elevate leg above heart to help with swelling . She will follow-up if her symptoms are not improving as expected     No follow-ups on file.   Kerby Nora, MD

## 2024-03-25 DIAGNOSIS — M79604 Pain in right leg: Secondary | ICD-10-CM | POA: Insufficient documentation

## 2024-03-25 NOTE — Assessment & Plan Note (Signed)
 Acute, no sign of cellulitis or abscess.  Area of soreness is only with active motion so suggest most likely musculoskeletal strain.  Less likely is pain from varicosities.  No evidence of DVT or superficial phlebitis. Treat with gentle range of motion exercises for her lower leg.  Elevate leg above heart to help with swelling . She will follow-up if her symptoms are not improving as expected

## 2024-04-02 ENCOUNTER — Ambulatory Visit: Payer: Medicare Other | Attending: Cardiology | Admitting: Cardiology

## 2024-04-02 ENCOUNTER — Encounter: Payer: Self-pay | Admitting: Cardiology

## 2024-04-02 VITALS — BP 130/56 | HR 56 | Ht 63.0 in | Wt 169.0 lb

## 2024-04-02 DIAGNOSIS — I25119 Atherosclerotic heart disease of native coronary artery with unspecified angina pectoris: Secondary | ICD-10-CM

## 2024-04-02 DIAGNOSIS — E785 Hyperlipidemia, unspecified: Secondary | ICD-10-CM | POA: Diagnosis not present

## 2024-04-02 DIAGNOSIS — I10 Essential (primary) hypertension: Secondary | ICD-10-CM | POA: Diagnosis not present

## 2024-04-02 MED ORDER — ISOSORBIDE MONONITRATE ER 30 MG PO TB24: 30.0000 mg | ORAL_TABLET | Freq: Every day | ORAL | 0 refills | Status: DC

## 2024-04-02 NOTE — Assessment & Plan Note (Signed)
 Cholesterol well-controlled with Crestor and Zetia. Zetia improved LDL from 81 to 42. Current regimen effective without side effects. - Continue Crestor and Zetia. - Monitor cholesterol levels in August.

## 2024-04-02 NOTE — Progress Notes (Signed)
 Cardiology Office Note:  .   Date:  04/02/2024  ID:  Kristina Carlson, DOB 01-20-1936, MRN [id number] PCP: Excell Seltzer, MD  [facility] Providers Cardiologist:  Bryan Lemma, MD     Chief Complaint  Patient presents with   Follow-up    76-month follow-up.  Intermittently having chest pain but not routinely   Coronary Artery Disease    I had chest pain on Sunday but not since then.    Patient Profile: .     Kristina Carlson is a  88 y.o. female with a PMH notable for Coronary Disease noted on Coronary CTA (with medically managed angina) being treated medically, HTN, HLD who presents here for 34-month follow-up at the request of Ermalene Searing, Amy E, MD.  CAD: The Coronary CTA revealed single-vessel disease with ostial and mid RCA disease positive by CT FFR (patient opted for medical management) HTN, HLD    I last saw Kristina Carlson in April 2024-at that time we reviewed the results of her Coronary CTA.  She was having relatively well-controlled symptoms on Toprol and Imdur and we therefore decided to hold off on any invasive evaluation per her decision based on informed decision-making.   She was subsequently seen by Carlos Levering, NP, NP on 3 occasions, last seen on January 07, 2024: She said that she feeling a lot better with addition of Toprol and amlodipine, but had had some episodes of hypotension during which she held her meds.  For the most part activity limited by back pain more so than any chest pain or dyspnea.  Lipids look better with the addition of Zetia.  Subjective  Discussed the use of AI scribe software for clinical note transcription with the patient, who gave verbal consent to proceed.  History of Present Illness History of Present Illness Kristina Carlson is an 88 year old female with coronary artery disease who presents for routine follow-up with intermittent episodes of chest pain.  She experienced chest pain on Sunday that lasted all day while at rest. The pain  was not severe, located on the right side, and lacked a burning sensation. She did not take nitroglycerin during this episode.  The pain was not exacerbated by exertion, and there was no shortness of breath, clamminess, or nausea. Occasionally, she experiences chest pain when lying on her right side.  This  She has not had any further chest pain since Sunday when either at rest or exertion.  Leading up to Sunday she had not had any resting or exertional chest pain.  No PND, orthopnea or edema.  She reports leg swelling, attributing it to recent illness and prolonged sitting or standing. No heart racing, skipping, flipping, dizziness, or wooziness. She performs daily activities without significant discomfort, except when climbing stairs, which requires holding onto the railing due to leg pain.  She has bursitis in her hips, which radiates down her legs, limiting physical activity.   She recalls a past staph infection in her leg, treated by a dermatologist. The area has been sensitive since her bursitis flared up about a month ago, but recent evaluation did not indicate an active infection.  She is currently taking amlodipine 2.5 mg, Imdur 30 mg, losartan 100 mg, Toprol 25 mg, aspirin 81 mg, Crestor 40 mg, Zetia 10 mg, and fish oil. Her blood pressure has been stable without recent low readings. Her cholesterol levels, last checked in October, showed significant improvement with an LDL reduction from 81 to 42, attributed  to the addition of Zetia.     Objective    Studies Reviewed: Marland Kitchen   EKG Interpretation Date/Time:  Thursday April 02 2024 10:01:01 EDT Ventricular Rate:  56 PR Interval:  124 QRS Duration:  78 QT Interval:  426 QTC Calculation: 411 R Axis:   -3  Text Interpretation: Sinus bradycardia Low voltage QRS When compared with ECG of 18-Oct-2023 09:01, Vent. rate has decreased BY  30 BPM Confirmed by Bryan Lemma ([id number]) on 04/02/2024 10:57:00 AM    Cor CTA w/ CTFFR 01/2023 -> ostial  and mid RCA stenoses.   Lab Results  Component Value Date   CHOL 95 10/21/2023   HDL 37.70 (L) 10/21/2023   LDLCALC 42 10/21/2023   LDLDIRECT 84.0 06/06/2021   TRIG 78.0 10/21/2023   CHOLHDL 3 10/21/2023   Component Ref Range & Units 8 mo ago (07/15/23) 1 yr ago (01/07/23) 1 yr ago (07/12/22)  Cholesterol 145 148 CM 128 CM  Triglycerides 76.0 116.0 CM 170.0 High  CM  HDL 48.50 43.50 36.10 Low   VLDL 15.2 23.2 34.0  LDL Cholesterol 81 81 58    Risk Assessment/Calculations:             Physical Exam:   VS:  BP (!) 130/56 (BP Location: Left Arm, Patient Position: Sitting, Cuff Size: Large)   Pulse (!) 56   Ht 5\' 3"  (1.6 m)   Wt 169 lb (76.7 kg)   SpO2 93%   BMI 29.94 kg/m    Wt Readings from Last 3 Encounters:  04/02/24 169 lb (76.7 kg)  03/24/24 170 lb 4 oz (77.2 kg)  01/07/24 167 lb 8 oz (76 kg)    GEN: Well nourished, well groomed in no acute distress; borderline obese, healthy-appearing NECK: No JVD; No carotid bruits CARDIAC: RRR; distant, but normal S1 and S2;  no murmurs, rubs, gallops RESPIRATORY:  Clear to auscultation without rales, wheezing or rhonchi ; nonlabored, good air movement. ABDOMEN: Soft, non-tender, non-distended EXTREMITIES:  No edema; No deformity     ASSESSMENT AND PLAN: .    Problem List Items Addressed This Visit       Cardiology Problems   Coronary artery disease involving native coronary artery with angina pectoris (HCC) - Primary (Chronic)   Right coronary artery main issue.  Current medications effectively control symptoms.  Intermittent chest pain-with most recent episode sounding non-cardiac.  Discussed potential heart catheterization if symptoms worsen. - Continue amlodipine 2.5 mg, Toprol 25 mg, Imdur 30 mg daily for antianginal benefit - Continue aspirin 81 mg, Crestor 40 mg, Zetia 10 mg daily with excellent lipid control.. - Use nitroglycerin for chest pain.  If effective, take extra Imdur dose that day and double for next  two days. - Refill Imdur with extra tablets for increased dosage. - Consider heart catheterization if symptoms worsen.      Relevant Medications   isosorbide mononitrate (IMDUR) 30 MG 24 hr tablet   Other Relevant Orders   EKG 12-Lead (Completed)   Essential hypertension (Chronic)   Blood pressure well-controlled on current regimen. Previous hypotension episode stabilized. Losartan not aiding angina management. - Continue losartan 100 mg, Toprol 25 mg, amlodipine 2.5 mg along with Imdur 30 mg daily. - Monitor blood pressure regularly. - Hold losartan if blood pressure is low.      Relevant Medications   isosorbide mononitrate (IMDUR) 30 MG 24 hr tablet   Other Relevant Orders   EKG 12-Lead (Completed)   Hyperlipidemia LDL goal <70 (Chronic)  Cholesterol well-controlled with Crestor and Zetia. Zetia improved LDL from 81 to 42. Current regimen effective without side effects. - Continue Crestor and Zetia. - Monitor cholesterol levels in August.      Relevant Medications   isosorbide mononitrate (IMDUR) 30 MG 24 hr tablet      Follow-Up: Return in about 6 months (around 10/02/2024). Recording duration: 30 minutes  Total time spent: 26 min spent with patient + 15 min spent charting = 43 min I spent 43 minutes in the care of Kristina Carlson today including reviewing labs (2 min), reviewing studies (3 min), face to face time discussing treatment options (26 min), reviewing records from prior notes my last note and 3 APP notes (4 min), 8 minutes, and documenting in the encounter.    Signed, Marykay Lex, MD, MS Bryan Lemma, M.D., M.S. Interventional Cardiologist  [facility]  Pager # [phone] Phone # [phone] [address] [place], [place] [address]

## 2024-04-02 NOTE — Assessment & Plan Note (Signed)
 Blood pressure well-controlled on current regimen. Previous hypotension episode stabilized. Losartan not aiding angina management. - Continue losartan 100 mg, Toprol 25 mg, amlodipine 2.5 mg along with Imdur 30 mg daily. - Monitor blood pressure regularly. - Hold losartan if blood pressure is low.

## 2024-04-02 NOTE — Patient Instructions (Signed)
 Medication Instructions:  Your physician has recommended you make the following change in your medication:   If you have chest pain and take a nitroglycerin tablet and that works then take extra isosorbide mononitrate (Imdur) and take a couple of days after that.    *If you need a refill on your cardiac medications before your next appointment, please call your pharmacy*  Lab Work: None  If you have labs (blood work) drawn today and your tests are completely normal, you will receive your results only by: MyChart Message (if you have MyChart) OR A paper copy in the mail If you have any lab test that is abnormal or we need to change your treatment, we will call you to review the results.  Testing/Procedures: None  Follow-Up: At [facility], you and your health needs are our priority.  As part of our continuing mission to provide you with exceptional heart care, our providers are all part of one team.  This team includes your primary Cardiologist (physician) and Advanced Practice Providers or APPs (Physician Assistants and Nurse Practitioners) who all work together to provide you with the care you need, when you need it.  Your next appointment:   6 month(s)  Provider:   Bryan Lemma, MD or Carlos Levering, NP

## 2024-04-02 NOTE — Assessment & Plan Note (Signed)
 Right coronary artery main issue.  Current medications effectively control symptoms.  Intermittent chest pain-with most recent episode sounding non-cardiac.  Discussed potential heart catheterization if symptoms worsen. - Continue amlodipine 2.5 mg, Toprol 25 mg, Imdur 30 mg daily for antianginal benefit - Continue aspirin 81 mg, Crestor 40 mg, Zetia 10 mg daily with excellent lipid control.. - Use nitroglycerin for chest pain.  If effective, take extra Imdur dose that day and double for next two days. - Refill Imdur with extra tablets for increased dosage. - Consider heart catheterization if symptoms worsen.

## 2024-04-22 DIAGNOSIS — H43813 Vitreous degeneration, bilateral: Secondary | ICD-10-CM | POA: Diagnosis not present

## 2024-04-22 DIAGNOSIS — H353134 Nonexudative age-related macular degeneration, bilateral, advanced atrophic with subfoveal involvement: Secondary | ICD-10-CM | POA: Diagnosis not present

## 2024-04-22 DIAGNOSIS — H04123 Dry eye syndrome of bilateral lacrimal glands: Secondary | ICD-10-CM | POA: Diagnosis not present

## 2024-04-29 DIAGNOSIS — M25512 Pain in left shoulder: Secondary | ICD-10-CM | POA: Diagnosis not present

## 2024-04-29 DIAGNOSIS — S42292A Other displaced fracture of upper end of left humerus, initial encounter for closed fracture: Secondary | ICD-10-CM | POA: Diagnosis not present

## 2024-04-29 DIAGNOSIS — M25522 Pain in left elbow: Secondary | ICD-10-CM | POA: Diagnosis not present

## 2024-05-06 ENCOUNTER — Ambulatory Visit: Admitting: Obstetrics & Gynecology

## 2024-05-09 DIAGNOSIS — S42292A Other displaced fracture of upper end of left humerus, initial encounter for closed fracture: Secondary | ICD-10-CM | POA: Diagnosis not present

## 2024-05-22 ENCOUNTER — Ambulatory Visit: Payer: Self-pay

## 2024-05-22 NOTE — Telephone Encounter (Signed)
 I spoke with lauren (DPR signed) for one month pt has had swelling in both lower legs with mild pain;Lauren said pt can tolerate the pain pt has not had redness on legs and no fever.Lauren said on 04/02/24 pt saw cardiology and was told did not think CP was cardiac related. No SOB.pt did fx humerus about 1 month ago after fall but [facility] saw pt for that. No available appts at [facility] or [facility] today. UC & ED precautions given and Lauren voiced understanding but said since going on for 1 month will wait to see Dr Cherlyn Cornet. Sending note to Dr Cherlyn Cornet and [facility].

## 2024-05-22 NOTE — Telephone Encounter (Signed)
 Copied from CRM [id number]. Topic: Clinical - Red Word Triage >> May 22, 2024  8:06 AM Rosamond Comes wrote: Red Word that prompted transfer to Nurse Triage: Lauren granddaughter calling in. Patient legs are swelling from knees to toes, patient fell about a month ago, injuring  left shoulder, worried about blood clots due to sitting   Chief Complaint: Leg swelling x "over a month" Symptoms: leg swelling and intermittent calf pain Frequency: intermittent Pertinent Negatives: Patient denies fever or redness Disposition: [] ED /[] Urgent Care (no appt availability in office) / [] Appointment(In office/virtual)/ []  [facility]/ [] Home Care/ [] Refused Recommended Disposition /[] [facility]/ []  Follow-up with PCP Additional Notes: Granddaughter reports mild bilateral leg swelling for "over a month" . Granddaughter also reports intermittent calf pain. Per granddaughter, patient does not move around a lot and had a fall about a month ago as well injuring her shoulder. No shortness of breath, no redness or tender to touch. No shortness of breath. Aptpt scheduled for 6/3 Reason for Disposition  [1] MILD swelling of both ankles (i.e., pedal edema) AND [2] new-onset or worsening  Answer Assessment - Initial Assessment Questions 1. ONSET: "When did the swelling start?" (e.g., minutes, hours, days)     1 month  2. LOCATION: "What part of the leg is swollen?"  "Are both legs swollen or just one leg?"     Both legs, knees to toes  3. SEVERITY: "How bad is the swelling?" (e.g., localized; mild, moderate, severe)   - Localized: Small area of swelling localized to one leg.   - MILD pedal edema: Swelling limited to foot and ankle, pitting edema < 1/4 inch (6 mm) deep, rest and elevation eliminate most or all swelling.   - MODERATE edema: Swelling of lower leg to knee, pitting edema > 1/4 inch (6 mm) deep, rest and elevation only partially reduce swelling.   - SEVERE edema: Swelling extends above  knee, facial or hand swelling present.      Mild swelling  4. REDNESS: "Does the swelling look red or infected?"     No  5. PAIN: "Is the swelling painful to touch?" If Yes, ask: "How painful is it?"   (Scale 1-10; mild, moderate or severe)     Mild to moderate in both calves  6. FEVER: "Do you have a fever?" If Yes, ask: "What is it, how was it measured, and when did it start?"      No  7. CAUSE: "What do you think is causing the leg swelling?"     Concern for blood clots due to patient sitting more  8. MEDICAL HISTORY: "Do you have a history of blood clots (e.g., DVT), cancer, heart failure, kidney disease, or liver failure?"     No  9. RECURRENT SYMPTOM: "Have you had leg swelling before?" If Yes, ask: "When was the last time?" "What happened that time?"     No  10. OTHER SYMPTOMS: "Do you have any other symptoms?" (e.g., chest pain, difficulty breathing)       Has some chest pain was told that it was reflux  11. PREGNANCY: "Is there any chance you are pregnant?" "When was your last menstrual period?"       no  Protocols used: Leg Swelling and Edema-A-AH

## 2024-05-22 NOTE — Telephone Encounter (Signed)
 Noted. Blood clot unlikely given Bilateral welling.  ED precautions given per note. Will see pt 6/3

## 2024-05-25 ENCOUNTER — Ambulatory Visit
Admission: RE | Admit: 2024-05-25 | Discharge: 2024-05-25 | Disposition: A | Discharge status: Home / Self Care | Source: Ambulatory Visit | Attending: Orthopedic Surgery | Admitting: Orthopedic Surgery

## 2024-05-25 ENCOUNTER — Other Ambulatory Visit: Payer: Self-pay | Admitting: Orthopedic Surgery

## 2024-05-25 DIAGNOSIS — S42292A Other displaced fracture of upper end of left humerus, initial encounter for closed fracture: Secondary | ICD-10-CM | POA: Diagnosis not present

## 2024-05-25 DIAGNOSIS — I82452 Acute embolism and thrombosis of left peroneal vein: Secondary | ICD-10-CM | POA: Diagnosis not present

## 2024-05-25 DIAGNOSIS — M7989 Other specified soft tissue disorders: Secondary | ICD-10-CM | POA: Diagnosis not present

## 2024-05-25 DIAGNOSIS — R2242 Localized swelling, mass and lump, left lower limb: Secondary | ICD-10-CM | POA: Diagnosis not present

## 2024-05-25 DIAGNOSIS — R600 Localized edema: Secondary | ICD-10-CM | POA: Diagnosis not present

## 2024-05-26 ENCOUNTER — Other Ambulatory Visit (INDEPENDENT_AMBULATORY_CARE_PROVIDER_SITE_OTHER)

## 2024-05-26 ENCOUNTER — Ambulatory Visit: Admitting: Family Medicine

## 2024-05-26 ENCOUNTER — Ambulatory Visit: Payer: Self-pay | Admitting: Family Medicine

## 2024-05-26 ENCOUNTER — Encounter: Payer: Self-pay | Admitting: Family Medicine

## 2024-05-26 VITALS — BP 120/80 | HR 66 | Temp 98.2°F | Ht 63.5 in | Wt 170.2 lb

## 2024-05-26 DIAGNOSIS — E039 Hypothyroidism, unspecified: Secondary | ICD-10-CM

## 2024-05-26 DIAGNOSIS — I82452 Acute embolism and thrombosis of left peroneal vein: Secondary | ICD-10-CM | POA: Diagnosis not present

## 2024-05-26 DIAGNOSIS — R600 Localized edema: Secondary | ICD-10-CM

## 2024-05-26 LAB — CBC WITH DIFFERENTIAL/PLATELET
Basophils Absolute: 0 10*3/uL (ref 0.0–0.1)
Basophils Relative: 0.7 % (ref 0.0–3.0)
Eosinophils Absolute: 0.2 10*3/uL (ref 0.0–0.7)
Eosinophils Relative: 3.1 % (ref 0.0–5.0)
HCT: 35.6 % — ABNORMAL LOW (ref 36.0–46.0)
Hemoglobin: 12 g/dL (ref 12.0–15.0)
Lymphocytes Relative: 21.2 % (ref 12.0–46.0)
Lymphs Abs: 1.1 10*3/uL (ref 0.7–4.0)
MCHC: 33.6 g/dL (ref 30.0–36.0)
MCV: 99 fl (ref 78.0–100.0)
Monocytes Absolute: 0.6 10*3/uL (ref 0.1–1.0)
Monocytes Relative: 11.3 % (ref 3.0–12.0)
Neutro Abs: 3.2 10*3/uL (ref 1.4–7.7)
Neutrophils Relative %: 63.7 % (ref 43.0–77.0)
Platelets: 287 10*3/uL (ref 150.0–400.0)
RBC: 3.6 Mil/uL — ABNORMAL LOW (ref 3.87–5.11)
RDW: 14.8 % (ref 11.5–15.5)
WBC: 5 10*3/uL (ref 4.0–10.5)

## 2024-05-26 LAB — T3, FREE: T3, Free: 2.5 pg/mL (ref 2.3–4.2)

## 2024-05-26 LAB — T4, FREE: Free T4: 0.56 ng/dL — ABNORMAL LOW (ref 0.60–1.60)

## 2024-05-26 LAB — COMPREHENSIVE METABOLIC PANEL WITH GFR
ALT: 27 U/L (ref 0–35)
AST: 25 U/L (ref 0–37)
Albumin: 3.8 g/dL (ref 3.5–5.2)
Alkaline Phosphatase: 98 U/L (ref 39–117)
BUN: 20 mg/dL (ref 6–23)
CO2: 31 meq/L (ref 19–32)
Calcium: 9.3 mg/dL (ref 8.4–10.5)
Chloride: 105 meq/L (ref 96–112)
Creatinine, Ser: 1.07 mg/dL (ref 0.40–1.20)
GFR: 47.3 mL/min — ABNORMAL LOW (ref >60.00)
Glucose, Bld: 80 mg/dL (ref 70–99)
Potassium: 4.6 meq/L (ref 3.5–5.1)
Sodium: 142 meq/L (ref 135–145)
Total Bilirubin: 0.6 mg/dL (ref 0.2–1.2)
Total Protein: 6.7 g/dL (ref 6.0–8.3)

## 2024-05-26 LAB — TSH: TSH: 22.79 u[IU]/mL — ABNORMAL HIGH (ref 0.35–5.50)

## 2024-05-26 LAB — BRAIN NATRIURETIC PEPTIDE: Pro B Natriuretic peptide (BNP): 51 pg/mL (ref 0.0–100.0)

## 2024-05-26 MED ORDER — RIVAROXABAN (XARELTO) VTE STARTER PACK (15 & 20 MG): ORAL_TABLET | ORAL | 0 refills | Status: DC

## 2024-05-26 NOTE — Progress Notes (Signed)
 Patient ID: Kristina Carlson, female    DOB: 1936-04-25, 88 y.o.   MRN: [id number]  This visit was conducted in person.  BP 120/80   Pulse 66   Temp 98.2 F (36.8 C) (Temporal)   Ht 5' 3.5 (1.613 m)   Wt 170 lb 4 oz (77.2 kg)   SpO2 96%   BMI 29.69 kg/m    CC:  Chief Complaint  Patient presents with   Leg Swelling    Seen Orthopedist yesterday and they ordered an ultrasound done at [facility]/ patient does have a blood clot left calf   Calf Pain   Labs Only    Family requesting labs.  States patient's color has been off and wants her liver checked    Subjective:   HPI: Kristina Carlson is a 88 y.o. female presenting on 05/26/2024 for Leg Swelling (Seen Orthopedist yesterday and they ordered an ultrasound done at [facility]/ patient does have a blood clot left calf), Calf Pain, and Labs Only (Family requesting labs.  States patient's color has been off and wants her liver checked)  Had noted swelling in left lower left.. seen at Ortho... sent for US ...  Results showed  distal DVT IMPRESSION: 1. Occlusive peroneal vein thrombus in the calf. No DVT proximal to the knee. 2. Subcutaneous edema in the calf.     She has not been moving around as much in last 4 week given broke her shoulder 4 weeks ago.  Has been noting swelling in both legs for weeks, pain in both  entire legs.  for  1 week. Family feels her color has been off and family requesting labs.      No Chest pain or sudden shortness of breath.  No history of clotting or bleeding issue, No PUD/GI bleed history.  Relevant past medical, surgical, family and social history reviewed and updated as indicated. Interim medical history since our last visit reviewed. Allergies and medications reviewed and updated. Outpatient Medications Prior to Visit  Medication Sig Dispense Refill   amLODipine  (NORVASC ) 2.5 MG tablet Take 1 tablet (2.5 mg total) by mouth daily. 90 tablet 3   Cholecalciferol  (VITAMIN D3) 1000 units CAPS Take 1 Capful by  mouth daily.     [employer] VITAMIN B12 1000 MCG tablet TAKE 1 TABLET BY MOUTH EVERY DAY 200 tablet 2   ezetimibe  (ZETIA ) 10 MG tablet Take 1 tablet (10 mg total) by mouth daily. 90 tablet 3   FIBER PO Take by mouth daily. Takes gummies     isosorbide  mononitrate (IMDUR ) 30 MG 24 hr tablet Take 1 tablet (30 mg total) by mouth at bedtime. May take extra tablet for a couple of days if she should have chest pain that stops with nitroglycerin  90 tablet 0   levothyroxine  (SYNTHROID ) 75 MCG tablet Take 75 mcg by mouth daily before breakfast.     losartan  (COZAAR ) 100 MG tablet Take 1 tablet (100 mg total) by mouth daily. 90 tablet 3   metoprolol  succinate (TOPROL -XL) 25 MG 24 hr tablet Take 25 mg by mouth daily.     Multiple Vitamins-Minerals (OCUVITE PRESERVISION) TABS Take 1 tablet by mouth 2 (two) times a day.     nitroGLYCERIN  (NITROSTAT ) 0.4 MG SL tablet Place 1 tablet (0.4 mg total) under the tongue every 5 (five) minutes as needed for chest pain. 25 tablet 3   Omega-3 Fatty Acids (FISH OIL OMEGA-3) 1000 MG CAPS Take 1 Capful by mouth daily.     ondansetron  (ZOFRAN )  4 MG tablet Take 1 tablet (4 mg total) by mouth every 8 (eight) hours as needed. 60 tablet 0   pantoprazole  (PROTONIX ) 40 MG tablet Take 1 tablet (40 mg total) by mouth daily. 90 tablet 3   rosuvastatin  (CRESTOR ) 40 MG tablet Take 1 tablet (40 mg total) by mouth daily. 90 tablet 3   tobramycin (TOBREX) 0.3 % ophthalmic solution SMARTSIG:In Eye(s)     traMADol (ULTRAM) 50 MG tablet Take 50 mg by mouth every 4 (four) hours as needed.     triamcinolone  cream (KENALOG ) 0.5 % Apply 1 Application topically 2 (two) times daily. 30 g 0   aspirin  81 MG EC tablet TAKE 1 TABLET (81 MG TOTAL) BY MOUTH DAILY. SWALLOW WHOLE. 90 tablet 3   levothyroxine  (SYNTHROID ) 75 MCG tablet TAKE 0.5 TABLETS BY MOUTH DAILY. 45 tablet 3   No facility-administered medications prior to visit.     Per HPI unless specifically indicated in ROS section below Review of  Systems  Constitutional:  Negative for fatigue and fever.  HENT:  Negative for congestion.   Eyes:  Negative for pain.  Respiratory:  Negative for cough and shortness of breath.   Cardiovascular:  Positive for leg swelling. Negative for chest pain and palpitations.  Gastrointestinal:  Negative for abdominal pain.  Genitourinary:  Negative for dysuria and vaginal bleeding.  Musculoskeletal:  Negative for back pain.  Neurological:  Negative for syncope, light-headedness and headaches.  Psychiatric/Behavioral:  Negative for dysphoric mood.    Objective:  BP 120/80   Pulse 66   Temp 98.2 F (36.8 C) (Temporal)   Ht 5' 3.5 (1.613 m)   Wt 170 lb 4 oz (77.2 kg)   SpO2 96%   BMI 29.69 kg/m   Wt Readings from Last 3 Encounters:  06/15/24 170 lb (77.1 kg)  06/08/24 171 lb (77.6 kg)  06/04/24 170 lb (77.1 kg)      Physical Exam Constitutional:      General: She is not in acute distress.    Appearance: Normal appearance. She is well-developed. She is not ill-appearing or toxic-appearing.  HENT:     Head: Normocephalic.     Right Ear: Hearing, tympanic membrane, ear canal and external ear normal. Tympanic membrane is not erythematous, retracted or bulging.     Left Ear: Hearing, tympanic membrane, ear canal and external ear normal. Tympanic membrane is not erythematous, retracted or bulging.     Nose: No mucosal edema or rhinorrhea.     Right Sinus: No maxillary sinus tenderness or frontal sinus tenderness.     Left Sinus: No maxillary sinus tenderness or frontal sinus tenderness.     Mouth/Throat:     Pharynx: Uvula midline.   Eyes:     General: Lids are normal. Lids are everted, no foreign bodies appreciated.     Conjunctiva/sclera: Conjunctivae normal.     Pupils: Pupils are equal, round, and reactive to light.   Neck:     Thyroid : No thyroid  mass or thyromegaly.     Vascular: No carotid bruit.     Trachea: Trachea normal.   Cardiovascular:     Rate and Rhythm: Normal rate  and regular rhythm.     Pulses: Normal pulses.     Heart sounds: Normal heart sounds, S1 normal and S2 normal. No murmur heard.    No friction rub. No gallop.  Pulmonary:     Effort: Pulmonary effort is normal. No tachypnea or respiratory distress.     Breath sounds: Normal  breath sounds. No decreased breath sounds, wheezing, rhonchi or rales.  Abdominal:     General: Bowel sounds are normal.     Palpations: Abdomen is soft.     Tenderness: There is no abdominal tenderness.   Musculoskeletal:     Cervical back: Normal range of motion and neck supple.     Right lower leg: 2+ Pitting Edema present.     Left lower leg: 1+ Pitting Edema present.   Skin:    General: Skin is warm and dry.     Findings: No rash.   Neurological:     Mental Status: She is alert.   Psychiatric:        Mood and Affect: Mood is not anxious or depressed.        Speech: Speech normal.        Behavior: Behavior normal. Behavior is cooperative.        Thought Content: Thought content normal.        Judgment: Judgment normal.       Results for orders placed or performed in visit on 05/26/24  Brain natriuretic peptide   Collection Time: 05/26/24 12:56 PM  Result Value Ref Range   Pro B Natriuretic peptide (BNP) 51.0 0.0 - 100.0 pg/mL  CBC with Differential/Platelet   Collection Time: 05/26/24 12:56 PM  Result Value Ref Range   WBC 5.0 4.0 - 10.5 K/uL   RBC 3.60 (L) 3.87 - 5.11 Mil/uL   Hemoglobin 12.0 12.0 - 15.0 g/dL   HCT 64.3 (L) 63.9 - 53.9 %   MCV 99.0 78.0 - 100.0 fl   MCHC 33.6 30.0 - 36.0 g/dL   RDW 85.1 88.4 - 84.4 %   Platelets 287.0 150.0 - 400.0 K/uL   Neutrophils Relative % 63.7 43.0 - 77.0 %   Lymphocytes Relative 21.2 12.0 - 46.0 %   Monocytes Relative 11.3 3.0 - 12.0 %   Eosinophils Relative 3.1 0.0 - 5.0 %   Basophils Relative 0.7 0.0 - 3.0 %   Neutro Abs 3.2 1.4 - 7.7 K/uL   Lymphs Abs 1.1 0.7 - 4.0 K/uL   Monocytes Absolute 0.6 0.1 - 1.0 K/uL   Eosinophils Absolute 0.2 0.0 -  0.7 K/uL   Basophils Absolute 0.0 0.0 - 0.1 K/uL  TSH   Collection Time: 05/26/24 12:56 PM  Result Value Ref Range   TSH 22.79 (H) 0.35 - 5.50 uIU/mL  Comprehensive metabolic panel with GFR   Collection Time: 05/26/24 12:56 PM  Result Value Ref Range   Sodium 142 135 - 145 mEq/L   Potassium 4.6 3.5 - 5.1 mEq/L   Chloride 105 96 - 112 mEq/L   CO2 31 19 - 32 mEq/L   Glucose, Bld 80 70 - 99 mg/dL   BUN 20 6 - 23 mg/dL   Creatinine, Ser 8.92 0.40 - 1.20 mg/dL   Total Bilirubin 0.6 0.2 - 1.2 mg/dL   Alkaline Phosphatase 98 39 - 117 U/L   AST 25 0 - 37 U/L   ALT 27 0 - 35 U/L   Total Protein 6.7 6.0 - 8.3 g/dL   Albumin 3.8 3.5 - 5.2 g/dL   GFR 52.69 (L) >39.99 mL/min   Calcium  9.3 8.4 - 10.5 mg/dL    Assessment and Plan  Acute deep vein thrombosis (DVT) of left peroneal vein (HCC) Assessment & Plan:  Acute, significant symptoms.  Likely provoked given minimal movement lately since shoulder fracture. High extension risk HASBLED 3 No symptoms of PE.  Will treat with 12 weeks of anticoagulation.       Peripheral edema Assessment & Plan: Possible additional clot on the left versus additional peripheral swelling from other source.  Will evaluate with labs.  Orders: -     Brain natriuretic peptide -     CBC with Differential/Platelet -     TSH -     Comprehensive metabolic panel with GFR  Hypothyroidism, unspecified type -     T3, free; Future -     T4, free; Future  Other orders -     Rivaroxaban ; Follow package directions: Take one 15mg  tablet by mouth twice a day. On day 22, switch to one 20mg  tablet once a day. Take with food.  Dispense: 51 each; Refill: 0    No follow-ups on file.   Greig Ring, MD

## 2024-05-26 NOTE — Assessment & Plan Note (Signed)
 Acute, significant symptoms.  Likely provoked given minimal movement lately since shoulder fracture. High extension risk HASBLED 3 No symptoms of PE.   Will treat with 12 weeks of anticoagulation.

## 2024-06-04 ENCOUNTER — Encounter: Payer: Self-pay | Admitting: Obstetrics and Gynecology

## 2024-06-04 ENCOUNTER — Ambulatory Visit: Admitting: Obstetrics and Gynecology

## 2024-06-04 VITALS — BP 153/76 | HR 64 | Wt 170.0 lb

## 2024-06-04 DIAGNOSIS — N8110 Cystocele, unspecified: Secondary | ICD-10-CM | POA: Diagnosis not present

## 2024-06-04 DIAGNOSIS — Z4689 Encounter for fitting and adjustment of other specified devices: Secondary | ICD-10-CM | POA: Diagnosis not present

## 2024-06-04 NOTE — Progress Notes (Signed)
   GYNECOLOGY OFFICE VISIT NOTE  History:   Kristina Carlson is a 88 y.o. G76P1 postmenopausal female with stage II pelvic organ prolapse who presents for pessary maintenance.  She has been using a size #4 ring with support pessary. The pessary has been working well, no complaints.  She denies any abnormal vaginal discharge, bleeding, pelvic pain or other concerns. Patient is currently being treated for a lower extremity DVT on anticoagulation    Past Medical History:  Diagnosis Date   Actinic keratosis    Coronary artery disease involving native coronary artery with angina pectoris (HCC) 06/2014   CARDIAC CATH 06/2014: Mild LAD 50%, OM 1 30%, 50% ostial RCA:;; Coronary CTA February 2024 shows severe ostial RCA disease (FFR ct +0.76).  Mild proximal LAD and LCx stenosis.   Dysplastic nevus 01/24/2023   Right chest. Severe atypia, deep margin involved. Excised 03/06/23   Hyperlipidemia    Hypertension    Hypothyroid    Insomnia    Macular degeneration    -- Dr. Jhon Moselle (optometrist).  Taking Ocuvite.    Past Surgical History:  Procedure Laterality Date   Cardiac CTA  01/25/2023   Coronary Calcium  Score 384.  Severe (70 to 99%) ostial RCA=> FFR ct 0.76 (significant).  Mild proximal LAD and LCx stenosis (25 to 49%).   LEFT HEART CATHETERIZATION WITH CORONARY ANGIOGRAM N/A 06/29/2014   Procedure: LEFT HEART CATHETERIZATION WITH CORONARY ANGIOGRAM;  Surgeon: Mickiel Albany, MD;  Location: [facility] CATH LAB;  Service: Cardiovascular;: Mild LAD 50%, OM 1 30%, 50% ostial RCA:   The following portions of the patient's history were reviewed and updated as appropriate: allergies, current medications, past family history, past medical history, past social history, past surgical history and problem list.   Review of Systems:  Pertinent items noted in HPI and remainder of comprehensive ROS otherwise negative.  Physical Exam:  BP (!) 153/76   Pulse 64   Wt 170 lb (77.1 kg)   BMI 29.64 kg/m   CONSTITUTIONAL: Well-developed, well-nourished female in no acute distress.  MUSCULOSKELETAL: Normal range of motion. No edema noted. NEUROLOGIC: Alert and oriented to person, place, and time. Normal muscle tone coordination. No cranial nerve deficit noted. PSYCHIATRIC: Normal mood and affect. Normal behavior. Normal judgment and thought content. CARDIOVASCULAR: Normal heart rate noted RESPIRATORY: Effort and breath sounds normal, no problems with respiration noted ABDOMEN: No masses noted. No other overt distention noted.   PELVIC: Normal external female genitalia with moderate atrophy; Urethral meatus normal in appearance, no urethral masses or discharge. The pessary was noted to be in place. It was removed and cleaned. Exam revealed no lesions or ulcerations in the vagina. The pessary was replaced. It was comfortable to the patient and fit well.  Examination done in presence of RN as the chaperone.     Assessment and Plan:     1. Stage II Pelvic Organ Prolapse 2. Pessary maintenance - Continue size #4 ring with support pessary - Follow up 3 months for pessary check  Routine preventative health maintenance measures emphasized, follow up with PCP regarding other medical issues. Please refer to After Visit Summary for other counseling recommendations.   Return in about 3 months (around 09/04/2024) for pessary cleaning.    I spent 20 minutes dedicated to the care of this patient including pre-visit review of records, face to face time with the patient discussing her conditions and treatments and post visit orders.

## 2024-06-04 NOTE — Progress Notes (Signed)
 RGYN here to have pessary cleaned and reinserted.

## 2024-06-08 ENCOUNTER — Telehealth: Payer: Self-pay

## 2024-06-08 ENCOUNTER — Encounter: Payer: Self-pay | Admitting: Internal Medicine

## 2024-06-08 ENCOUNTER — Ambulatory Visit (INDEPENDENT_AMBULATORY_CARE_PROVIDER_SITE_OTHER): Admitting: Internal Medicine

## 2024-06-08 VITALS — BP 120/68 | HR 65 | Temp 97.6°F | Ht 63.5 in | Wt 171.0 lb

## 2024-06-08 DIAGNOSIS — R319 Hematuria, unspecified: Secondary | ICD-10-CM | POA: Diagnosis not present

## 2024-06-08 DIAGNOSIS — I82452 Acute embolism and thrombosis of left peroneal vein: Secondary | ICD-10-CM

## 2024-06-08 LAB — POC URINALSYSI DIPSTICK (AUTOMATED)
Bilirubin, UA: NEGATIVE
Glucose, UA: NEGATIVE
Ketones, UA: NEGATIVE
Nitrite, UA: NEGATIVE
Protein, UA: NEGATIVE
Spec Grav, UA: 1.01 (ref 1.010–1.025)
Urobilinogen, UA: 0.2 U/dL
pH, UA: 7.5 (ref 5.0–8.0)

## 2024-06-08 MED ORDER — FUROSEMIDE 20 MG PO TABS: 20.0000 mg | ORAL_TABLET | Freq: Every day | ORAL | 0 refills | Status: DC | PRN

## 2024-06-08 NOTE — Telephone Encounter (Signed)
 Copied from CRM [id number]. Topic: Clinical - Medication Question >> Jun 08, 2024 12:42 PM Marlan Silva wrote: Reason for CRM: Patients daughter Barbra Ley called in stating that the patient is on blood thinners, and she is also taking daily baby aspirins. Patient had blood in her urine recently and wants to know if she should stop the aspirin . Patient can be reached at [phone] Coleen Dauer granddaughter.   Nurse attempted to reach out to provide assistance as needed: no answer: left voicemail +

## 2024-06-08 NOTE — Assessment & Plan Note (Signed)
 Mild gross hematuria Urinalysis shows 2+ leuks but no nitrite. 1+ blood Will send culture--no Rx unless positive (since no symptoms) Likely from the xarelto --but if persists, will need urology evaluation

## 2024-06-08 NOTE — Progress Notes (Signed)
 Subjective:    Patient ID: Kristina Carlson, female    DOB: 10/05/1936, 88 y.o.   MRN: [id number]  HPI Here for follow up of recent DVT and blood in urine With great granddaughter  Both legs feel tight still Hard to get up Feels the problems started before her fall and left humerus fracture Gets sense like someone is sticking them with pins  Noticed blood in urine 2 nights ago--and some on toilet paper when wiping yesterday and today No dysuria or increased urgency  Current Outpatient Medications on File Prior to Visit  Medication Sig Dispense Refill   amLODipine  (NORVASC ) 2.5 MG tablet Take 1 tablet (2.5 mg total) by mouth daily. 90 tablet 3   Cholecalciferol  (VITAMIN D3) 1000 units CAPS Take 1 Capful by mouth daily.     [facility] VITAMIN B12 1000 MCG tablet TAKE 1 TABLET BY MOUTH EVERY DAY 200 tablet 2   ezetimibe  (ZETIA ) 10 MG tablet Take 1 tablet (10 mg total) by mouth daily. 90 tablet 3   FIBER PO Take by mouth daily. Takes gummies     isosorbide  mononitrate (IMDUR ) 30 MG 24 hr tablet Take 1 tablet (30 mg total) by mouth at bedtime. May take extra tablet for a couple of days if she should have chest pain that stops with nitroglycerin  90 tablet 0   levothyroxine  (SYNTHROID ) 75 MCG tablet Take 75 mcg by mouth daily before breakfast.     losartan  (COZAAR ) 100 MG tablet Take 1 tablet (100 mg total) by mouth daily. 90 tablet 3   metoprolol  succinate (TOPROL -XL) 25 MG 24 hr tablet Take 25 mg by mouth daily.     Multiple Vitamins-Minerals (OCUVITE PRESERVISION) TABS Take 1 tablet by mouth 2 (two) times a day.     nitroGLYCERIN  (NITROSTAT ) 0.4 MG SL tablet Place 1 tablet (0.4 mg total) under the tongue every 5 (five) minutes as needed for chest pain. 25 tablet 3   Omega-3 Fatty Acids (FISH OIL OMEGA-3) 1000 MG CAPS Take 1 Capful by mouth daily.     ondansetron  (ZOFRAN ) 4 MG tablet Take 1 tablet (4 mg total) by mouth every 8 (eight) hours as needed. 60 tablet 0   pantoprazole  (PROTONIX ) 40 MG  tablet Take 1 tablet (40 mg total) by mouth daily. 90 tablet 3   RIVAROXABAN  (XARELTO ) VTE STARTER PACK (15 & 20 MG) Follow package directions: Take one 15mg  tablet by mouth twice a day. On day 22, switch to one 20mg  tablet once a day. Take with food. 51 each 0   rosuvastatin  (CRESTOR ) 40 MG tablet Take 1 tablet (40 mg total) by mouth daily. 90 tablet 3   tobramycin (TOBREX) 0.3 % ophthalmic solution SMARTSIG:In Eye(s)     traMADol (ULTRAM) 50 MG tablet Take 50 mg by mouth every 4 (four) hours as needed.     triamcinolone  cream (KENALOG ) 0.5 % Apply 1 Application topically 2 (two) times daily. 30 g 0   No current facility-administered medications on file prior to visit.    Allergies  Allergen Reactions   Lisinopril  Cough    Past Medical History:  Diagnosis Date   Actinic keratosis    Coronary artery disease involving native coronary artery with angina pectoris (HCC) 06/2014   CARDIAC CATH 06/2014: Mild LAD 50%, OM 1 30%, 50% ostial RCA:;; Coronary CTA February 2024 shows severe ostial RCA disease (FFR ct +0.76).  Mild proximal LAD and LCx stenosis.   Dysplastic nevus 01/24/2023   Right chest. Severe atypia, deep margin  involved. Excised 03/06/23   Hyperlipidemia    Hypertension    Hypothyroid    Insomnia    Macular degeneration    -- Dr. Jhon Moselle (optometrist).  Taking Ocuvite.    Past Surgical History:  Procedure Laterality Date   Cardiac CTA  01/25/2023   Coronary Calcium  Score 384.  Severe (70 to 99%) ostial RCA=> FFR ct 0.76 (significant).  Mild proximal LAD and LCx stenosis (25 to 49%).   LEFT HEART CATHETERIZATION WITH CORONARY ANGIOGRAM N/A 06/29/2014   Procedure: LEFT HEART CATHETERIZATION WITH CORONARY ANGIOGRAM;  Surgeon: Mickiel Albany, MD;  Location: [facility] CATH LAB;  Service: Cardiovascular;: Mild LAD 50%, OM 1 30%, 50% ostial RCA:    Family History  Problem Relation Age of Onset   Stroke Father    Diabetes Brother    Lymphoma Brother    Breast cancer Cousin         maternal cousin    Social History   Socioeconomic History   Marital status: Widowed    Spouse name: Not on file   Number of children: 1   Years of education: 12   Highest education level: High school graduate  Occupational History   Occupation: Retired   Tobacco Use   Smoking status: Never   Smokeless tobacco: Never  Vaping Use   Vaping status: Never Used  Substance and Sexual Activity   Alcohol use: No   Drug use: No   Sexual activity: Not Currently  Other Topics Concern   Not on file  Social History Narrative   01/25/21   From: the area   Living: alone, has family nearby   Work: retired - Public librarian       Family: 1 child - Erla Haw - 2 grandchildren and 2 great grandchildren      Enjoys: spend time with friends, reading      Exercise: not currently   Diet: pretty good, healthy foods, 3 meals a day      Safety   Seat belts: Yes    Guns: No   Safe in relationships: Yes    Social Drivers of Corporate investment banker Strain: Low Risk  (06/13/2023)   Overall Financial Resource Strain (CARDIA)    Difficulty of Paying Living Expenses: Not hard at all  Food Insecurity: No Food Insecurity (06/13/2023)   Hunger Vital Sign    Worried About Running Out of Food in the Last Year: Never true    Ran Out of Food in the Last Year: Never true  Transportation Needs: No Transportation Needs (06/13/2023)   PRAPARE - Administrator, Civil Service (Medical): No    Lack of Transportation (Non-Medical): No  Physical Activity: Insufficiently Active (06/13/2023)   Exercise Vital Sign    Days of Exercise per Week: 7 days    Minutes of Exercise per Session: 10 min  Stress: No Stress Concern Present (06/13/2023)   [employer] of Occupational Health - Occupational Stress Questionnaire    Feeling of Stress : Not at all  Social Connections: Moderately Integrated (06/13/2023)   Social Connection and Isolation Panel    Frequency of Communication with Friends and  Family: More than three times a week    Frequency of Social Gatherings with Friends and Family: More than three times a week    Attends Religious Services: More than 4 times per year    Active Member of [employer] or Organizations: Yes    Attends Banker Meetings: More than  4 times per year    Marital Status: Widowed  Intimate Partner Violence: Not At Risk (06/13/2023)   Humiliation, Afraid, Rape, and Kick questionnaire    Fear of Current or Ex-Partner: No    Emotionally Abused: No    Physically Abused: No    Sexually Abused: No   Review of Systems Doesn't add salt No chest pain or SOB Weight is stable    Objective:   Physical Exam Abdominal:     Comments: No suprapubic tenderness   Musculoskeletal:     Comments: Left > right calf tightness and slight tenderness No skin lesions            Assessment & Plan:

## 2024-06-08 NOTE — Addendum Note (Signed)
 Addended by: Franne Ivory on: 06/08/2024 11:49 AM   Modules accepted: Orders

## 2024-06-08 NOTE — Assessment & Plan Note (Signed)
 Actually has findings to suggest there was also a clot on right No symptoms to suggest PE Will continue the xarelto   Rx furosemide 20mg  daily prn to try to reduce the tightness and discomfort

## 2024-06-09 ENCOUNTER — Telehealth: Payer: Self-pay | Admitting: Family Medicine

## 2024-06-09 ENCOUNTER — Ambulatory Visit: Payer: Self-pay

## 2024-06-09 NOTE — Telephone Encounter (Unsigned)
 Copied from CRM [id number]. Topic: General - Call Back - No Documentation >> Jun 09, 2024  4:13 PM Pam Bode wrote: Reason for CRM: Patient granddaughter Kristina Carlson is calling back she missed the patient call. Please call patient granddaughter back.[phone]

## 2024-06-09 NOTE — Telephone Encounter (Signed)
 Yes, would stop aspirin  for now.  Continue Xarelto  for now.  Dr. Joelle Musca evaluated yesterday, looks like we are waiting on the final urine test to return. He will be in touch once it's back.

## 2024-06-09 NOTE — Telephone Encounter (Signed)
 Called and spoke to daughter on dpr. She is aware to  hold Asprin. Requested that we send message to Dr. Joelle Musca to make sure he does not want to start on abx now for UTI. I informed her that we were waiting until results returned. She requested that we send message to make sure.

## 2024-06-09 NOTE — Telephone Encounter (Signed)
 Left message to call office to make sure Dr Alger Infield message was given to them.

## 2024-06-09 NOTE — Telephone Encounter (Signed)
 FYI Only or Action Required?: Action required by provider  Patient was last seen in primary care on 06/08/2024 by Helaine Llanos, MD. Called Nurse Triage reporting Advice Only.      Triage Disposition: Call PCP Now  Patient/caregiver understands and will follow disposition?: Yes   Reason for Disposition  [1] Caller has URGENT medicine question about med that PCP or specialist prescribed AND [2] triager unable to answer question  Answer Assessment - Initial Assessment Questions 1. NAME of MEDICINE: What medicine(s) are you calling about?     asa 2. QUESTION: What is your question? (e.g., double dose of medicine, side effect)     none   Grand daughter is wanting to know should be pt be taking the 81mg  ASA with her Eliquis.  Protocols used: Medication Question Call-A-AH Copied from CRM [id number]. Topic: Clinical - Red Word Triage >> Jun 09, 2024  9:47 AM Bambi Bonine D wrote: Red Word that prompted transfer to Nurse Triage: Blood in urine   Patients daughter Barbra Ley called in stating that the patient is on blood thinners, and she is also taking daily baby aspirins. Patient had blood in her urine recently and wants to know if she should stop the aspirin . Patient can be reached at [phone] Coleen Dauer granddaughter.  UPDATE: Pt's granddaughter Barbra Ley is calling back in regards to a missed call from the nurse.

## 2024-06-09 NOTE — Telephone Encounter (Signed)
 Pt granddaughter has been informed for pt to stop ASA.

## 2024-06-10 ENCOUNTER — Ambulatory Visit: Payer: Self-pay | Admitting: Internal Medicine

## 2024-06-10 LAB — URINE CULTURE
MICRO NUMBER:: 16584497
SPECIMEN QUALITY:: ADEQUATE

## 2024-06-10 NOTE — Telephone Encounter (Signed)
 Left detailed message on VM for granddaughter.

## 2024-06-15 ENCOUNTER — Encounter: Payer: Self-pay | Admitting: Family Medicine

## 2024-06-15 ENCOUNTER — Ambulatory Visit (INDEPENDENT_AMBULATORY_CARE_PROVIDER_SITE_OTHER): Payer: Medicare Other

## 2024-06-15 VITALS — BP 120/68 | Ht 63.5 in | Wt 170.0 lb

## 2024-06-15 DIAGNOSIS — Z0000 Encounter for general adult medical examination without abnormal findings: Secondary | ICD-10-CM | POA: Diagnosis not present

## 2024-06-15 NOTE — Patient Instructions (Signed)
 [name] , Thank you for taking time out of your busy schedule to complete your Annual Wellness Visit with me. I enjoyed our conversation and look forward to speaking with you again next year. I, as well as your care team,  appreciate your ongoing commitment to your health goals. Please review the following plan we discussed and let me know if I can assist you in the future. Your Game plan/ To Do List    Referrals: If you haven't heard from the office you've been referred to, please reach out to them at the phone provided.  none Follow up Visits: Next Medicare AWV with our clinical staff: patient declined    Have you seen your provider in the last 6 months (3 months if uncontrolled diabetes)? No Next Office Visit with your provider: 07/08/2024  Clinician Recommendations:  Aim for 30 minutes of exercise or brisk walking, 6-8 glasses of water, and 5 servings of fruits and vegetables each day.       This is a list of the screening recommended for you and due dates:  Health Maintenance  Topic Date Due   DEXA scan (bone density measurement)  07/03/2020   COVID-19 Vaccine (5 - 2024-25 season) 08/25/2023   Flu Shot  07/24/2024   Medicare Annual Wellness Visit  06/15/2025   DTaP/Tdap/Td vaccine (4 - Td or Tdap) 03/06/2028   Pneumococcal Vaccine for age over 16  Completed   Zoster (Shingles) Vaccine  Completed   HPV Vaccine  Aged Out   Meningitis B Vaccine  Aged Out    Advanced directives: (Copy Requested) Please bring a copy of your health care power of attorney and living will to the office to be added to your chart at your convenience. You can mail to [facility] [address] [place], [place] [address] or email to [email or web address] Advance Care Planning is important because it:  [x]  Makes sure you receive the medical care that is consistent with your values, goals, and preferences  [x]  It provides guidance to your family and loved ones and reduces their  decisional burden about whether or not they are making the right decisions based on your wishes.  Follow the link provided in your after visit summary or read over the paperwork we have mailed to you to help you started getting your Advance Directives in place. If you need assistance in completing these, please reach out to us  so that we can help you!  See attachments for Preventive Care and Fall Prevention Tips.

## 2024-06-15 NOTE — Progress Notes (Signed)
 Because this visit was a virtual/telehealth visit,  certain criteria was not obtained, such a blood pressure, CBG if applicable, and timed get up and go. Any medications not marked as taking were not mentioned during the medication reconciliation part of the visit. Any vitals not documented were not able to be obtained due to this being a telehealth visit or patient was unable to self-report a recent blood pressure reading due to a lack of equipment at home via telehealth. Vitals that have been documented are verbally provided by the patient.  This visit was performed by a medical professional under my direct supervision. I was immediately available for consultation/collaboration. I have reviewed and agree with the Annual Wellness Visit documentation.  Subjective:   Kristina Carlson is a 88 y.o. who presents for a Medicare Wellness preventive visit.  As a reminder, Annual Wellness Visits don't include a physical exam, and some assessments may be limited, especially if this visit is performed virtually. We may recommend an in-person follow-up visit with your provider if needed.  Visit Complete: Virtual I connected with  Kristina Carlson on 06/15/24 by a audio enabled telemedicine application and verified that I am speaking with the correct person using two identifiers.  Patient Location: Home  Provider Location: Home Office  I discussed the limitations of evaluation and management by telemedicine. The patient expressed understanding and agreed to proceed.  Vital Signs: Because this visit was a virtual/telehealth visit, some criteria may be missing or patient reported. Any vitals not documented were not able to be obtained and vitals that have been documented are patient reported.  VideoDeclined- This patient declined Librarian, academic. Therefore the visit was completed with audio only.  Persons Participating in Visit: Patient.  AWV Questionnaire: No: Patient Medicare  AWV questionnaire was not completed prior to this visit.  Cardiac Risk Factors include: advanced age (>20men, >34 women);hypertension;dyslipidemia     Objective:    Today's Vitals   06/15/24 1546 06/15/24 1547  BP: 120/68   Weight: 170 lb (77.1 kg)   Height: 5' 3.5 (1.613 m)   PainSc:  6    Body mass index is 29.64 kg/m.     06/15/2024    3:46 PM 06/13/2023    1:04 PM 06/11/2022    1:26 PM 06/08/2021    1:17 PM 03/30/2020   11:15 AM 10/15/2019    1:44 PM 06/08/2019    3:49 PM  Advanced Directives  Does Patient Have a Medical Advance Directive? Yes Yes Yes Yes Yes No No  Type of Estate agent of [place];Living will Healthcare Power of [place];Living will Healthcare Power of [place];Living will Healthcare Power of [place];Living will Living will;Healthcare Power of Attorney    Does patient want to make changes to medical advance directive? No - Patient declined        Copy of Healthcare Power of Attorney in Chart? No - copy requested No - copy requested No - copy requested No - copy requested No - copy requested    Would patient like information on creating a medical advance directive?      No - Patient declined No - Patient declined      Data saved with a previous flowsheet row definition    Current Medications (verified) Outpatient Encounter Medications as of 06/15/2024  Medication Sig   amLODipine  (NORVASC ) 2.5 MG tablet Take 1 tablet (2.5 mg total) by mouth daily.   Cholecalciferol  (VITAMIN D3) 1000 units CAPS Take 1 Capful by mouth  daily.   [employer] VITAMIN B12 1000 MCG tablet TAKE 1 TABLET BY MOUTH EVERY DAY   ezetimibe  (ZETIA ) 10 MG tablet Take 1 tablet (10 mg total) by mouth daily.   FIBER PO Take by mouth daily. Takes gummies   furosemide (LASIX) 20 MG tablet Take 1 tablet (20 mg total) by mouth daily as needed.   isosorbide  mononitrate (IMDUR ) 30 MG 24 hr tablet Take 1 tablet (30 mg total) by mouth at bedtime. May take extra tablet for a couple of  days if she should have chest pain that stops with nitroglycerin    levothyroxine  (SYNTHROID ) 75 MCG tablet Take 75 mcg by mouth daily before breakfast.   losartan  (COZAAR ) 100 MG tablet Take 1 tablet (100 mg total) by mouth daily.   metoprolol  succinate (TOPROL -XL) 25 MG 24 hr tablet Take 25 mg by mouth daily.   Multiple Vitamins-Minerals (OCUVITE PRESERVISION) TABS Take 1 tablet by mouth 2 (two) times a day.   nitroGLYCERIN  (NITROSTAT ) 0.4 MG SL tablet Place 1 tablet (0.4 mg total) under the tongue every 5 (five) minutes as needed for chest pain.   Omega-3 Fatty Acids (FISH OIL OMEGA-3) 1000 MG CAPS Take 1 Capful by mouth daily.   ondansetron  (ZOFRAN ) 4 MG tablet Take 1 tablet (4 mg total) by mouth every 8 (eight) hours as needed.   pantoprazole  (PROTONIX ) 40 MG tablet Take 1 tablet (40 mg total) by mouth daily.   RIVAROXABAN  (XARELTO ) VTE STARTER PACK (15 & 20 MG) Follow package directions: Take one 15mg  tablet by mouth twice a day. On day 22, switch to one 20mg  tablet once a day. Take with food.   rosuvastatin  (CRESTOR ) 40 MG tablet Take 1 tablet (40 mg total) by mouth daily.   tobramycin (TOBREX) 0.3 % ophthalmic solution SMARTSIG:In Eye(s)   traMADol (ULTRAM) 50 MG tablet Take 50 mg by mouth every 4 (four) hours as needed.   triamcinolone  cream (KENALOG ) 0.5 % Apply 1 Application topically 2 (two) times daily.   No facility-administered encounter medications on file as of 06/15/2024.    Allergies (verified) Lisinopril    History: Past Medical History:  Diagnosis Date   Actinic keratosis    Coronary artery disease involving native coronary artery with angina pectoris (HCC) 06/2014   CARDIAC CATH 06/2014: Mild LAD 50%, OM 1 30%, 50% ostial RCA:;; Coronary CTA February 2024 shows severe ostial RCA disease (FFR ct +0.76).  Mild proximal LAD and LCx stenosis.   Dysplastic nevus 01/24/2023   Right chest. Severe atypia, deep margin involved. Excised 03/06/23   Hyperlipidemia    Hypertension     Hypothyroid    Insomnia    Macular degeneration    -- Dr. Rudell Rue (optometrist).  Taking Ocuvite.   Past Surgical History:  Procedure Laterality Date   Cardiac CTA  01/25/2023   Coronary Calcium  Score 384.  Severe (70 to 99%) ostial RCA=> FFR ct 0.76 (significant).  Mild proximal LAD and LCx stenosis (25 to 49%).   LEFT HEART CATHETERIZATION WITH CORONARY ANGIOGRAM N/A 06/29/2014   Procedure: LEFT HEART CATHETERIZATION WITH CORONARY ANGIOGRAM;  Surgeon: Victory LELON Claudene DOUGLAS, MD;  Location: [facility] CATH LAB;  Service: Cardiovascular;: Mild LAD 50%, OM 1 30%, 50% ostial RCA:   Family History  Problem Relation Age of Onset   Stroke Father    Diabetes Brother    Lymphoma Brother    Breast cancer Cousin        maternal cousin   Social History   Socioeconomic History  Marital status: Widowed    Spouse name: Not on file   Number of children: 1   Years of education: 12   Highest education level: High school graduate  Occupational History   Occupation: Retired   Tobacco Use   Smoking status: Never   Smokeless tobacco: Never  Vaping Use   Vaping status: Never Used  Substance and Sexual Activity   Alcohol use: No   Drug use: No   Sexual activity: Not Currently  Other Topics Concern   Not on file  Social History Narrative   01/25/21   From: the area   Living: alone, has family nearby   Work: retired - Public librarian       Family: 1 child - Cathlyn - 2 grandchildren and 2 great grandchildren      Enjoys: spend time with friends, reading      Exercise: not currently   Diet: pretty good, healthy foods, 3 meals a day      Safety   Seat belts: Yes    Guns: No   Safe in relationships: Yes    Social Drivers of Corporate investment banker Strain: Low Risk  (06/15/2024)   Overall Financial Resource Strain (CARDIA)    Difficulty of Paying Living Expenses: Not hard at all  Food Insecurity: No Food Insecurity (06/15/2024)   Hunger Vital Sign    Worried About Running Out of Food  in the Last Year: Never true    Ran Out of Food in the Last Year: Never true  Transportation Needs: No Transportation Needs (06/15/2024)   PRAPARE - Administrator, Civil Service (Medical): No    Lack of Transportation (Non-Medical): No  Physical Activity: Insufficiently Active (06/15/2024)   Exercise Vital Sign    Days of Exercise per Week: 3 days    Minutes of Exercise per Session: 20 min  Stress: No Stress Concern Present (06/15/2024)   [employer] of Occupational Health - Occupational Stress Questionnaire    Feeling of Stress: Not at all  Social Connections: Moderately Integrated (06/15/2024)   Social Connection and Isolation Panel    Frequency of Communication with Friends and Family: More than three times a week    Frequency of Social Gatherings with Friends and Family: More than three times a week    Attends Religious Services: More than 4 times per year    Active Member of [employer] or Organizations: Yes    Attends Banker Meetings: More than 4 times per year    Marital Status: Widowed    Tobacco Counseling Counseling given: Not Answered    Clinical Intake:  Pre-visit preparation completed: Yes  Pain : 0-10 Pain Score: 6  Pain Type: Acute pain Pain Location: Arm Pain Orientation: Left Pain Descriptors / Indicators: Aching, Sore Pain Onset: Today     BMI - recorded: 29.64 Nutritional Status: BMI 25 -29 Overweight Nutritional Risks: None Diabetes: No  Lab Results  Component Value Date   HGBA1C 5.5 10/03/2016     How often do you need to have someone help you when you read instructions, pamphlets, or other written materials from your doctor or pharmacy?: 1 - Never  Interpreter Needed?: No  Information entered by :: Isidore Margraf,cma   Activities of Daily Living     06/15/2024    3:50 PM  In your present state of health, do you have any difficulty performing the following activities:  Hearing? 0  Vision? 0  Difficulty  concentrating or making  decisions? 0  Walking or climbing stairs? 0  Dressing or bathing? 0  Doing errands, shopping? 0  Preparing Food and eating ? N  Using the Toilet? N  In the past six months, have you accidently leaked urine? N  Do you have problems with loss of bowel control? N  Managing your Medications? N  Managing your Finances? N  Housekeeping or managing your Housekeeping? N    Patient Care Team: [name], MD as PCP - General (Family Medicine) Anner Alm ORN, MD as PCP - Cardiology (Cardiology)  I have updated your Care Teams any recent Medical Services you may have received from other providers in the past year.     Assessment:   This is a routine wellness examination for Kristina Carlson.  Hearing/Vision screen Hearing Screening - Comments:: No difficulties Vision Screening - Comments:: None at this time   Goals Addressed             This Visit's Progress    Patient Stated       Patient wants to get better       Depression Screen     06/15/2024    3:51 PM 06/08/2024   10:58 AM 05/26/2024   11:43 AM 06/13/2023    1:03 PM 02/14/2023    3:54 PM 06/11/2022    1:23 PM 06/08/2021    1:18 PM  PHQ 2/9 Scores  PHQ - 2 Score 0 0 0 0 0 0 0  PHQ- 9 Score 0    3  0    Fall Risk     06/15/2024    3:49 PM 06/08/2024   10:58 AM 05/26/2024   11:43 AM 06/13/2023    1:07 PM 02/14/2023    3:54 PM  Fall Risk   Falls in the past year? 1 1 1  0 1  Number falls in past yr: 0 0 0 0 0  Injury with Fall? 1 1 1  0 0  Risk for fall due to : Impaired balance/gait;History of fall(s) History of fall(s) History of fall(s) No Fall Risks No Fall Risks  Follow up Falls evaluation completed Falls evaluation completed Falls evaluation completed Falls prevention discussed;Falls evaluation completed Falls evaluation completed    MEDICARE RISK AT HOME:  Medicare Risk at Home Any stairs in or around the home?: Yes If so, are there any without handrails?: No Home free of loose throw rugs in  walkways, pet beds, electrical cords, etc?: Yes Adequate lighting in your home to reduce risk of falls?: Yes Life alert?: No Use of a cane, walker or w/c?: No Grab bars in the bathroom?: No Shower chair or bench in shower?: Yes Elevated toilet seat or a handicapped toilet?: Yes  TIMED UP AND GO:  Was the test performed?  No  Cognitive Function: 6CIT completed    06/08/2021    1:22 PM  MMSE - Mini Mental State Exam  Not completed: Refused        06/15/2024    3:49 PM 06/13/2023    1:09 PM 06/11/2022    1:29 PM 03/30/2020   11:18 AM  6CIT Screen  What Year? 0 points 0 points 0 points 0 points  What month? 0 points 0 points 0 points 0 points  What time? 0 points 0 points 0 points 0 points  Count back from 20 0 points 0 points 0 points 0 points  Months in reverse 0 points 0 points 0 points 0 points  Repeat phrase 0 points 2 points 0  points 0 points  Total Score 0 points 2 points 0 points 0 points    Immunizations Immunization History  Administered Date(s) Administered   Fluad Quad(high Dose 65+) 08/29/2019, 09/22/2020, 09/19/2021, 09/28/2022   Influenza Whole 11/12/2007, 09/17/2008, 10/14/2009, 10/25/2010, 09/05/2011   Influenza, High Dose Seasonal PF 08/20/2016, 08/30/2017, 08/30/2017, 09/17/2018, 08/28/2023   Influenza-Unspecified 08/24/2013, 09/22/2014, 09/17/2018   PFIZER(Purple Top)SARS-COV-2 Vaccination 02/29/2020, 03/22/2020, 10/13/2020   Pfizer Covid-19 Vaccine Bivalent Booster 60yrs & up 12/26/2021   Pneumococcal Conjugate-13 06/22/2015, 02/17/2018, 02/17/2018   Pneumococcal Polysaccharide-23 11/23/2004   Tdap 09/05/2011, 02/28/2018, 03/06/2018   Zoster Recombinant(Shingrix) 05/26/2018, 07/26/2018   Zoster, Live 01/09/2018    Screening Tests Health Maintenance  Topic Date Due   DEXA SCAN  07/03/2020   COVID-19 Vaccine (5 - 2024-25 season) 08/25/2023   INFLUENZA VACCINE  07/24/2024   Medicare Annual Wellness (AWV)  06/15/2025   DTaP/Tdap/Td (4 - Td or Tdap)  03/06/2028   Pneumococcal Vaccine: 50+ Years  Completed   Zoster Vaccines- Shingrix  Completed   HPV VACCINES  Aged Out   Meningococcal B Vaccine  Aged Out    Health Maintenance  Health Maintenance Due  Topic Date Due   DEXA SCAN  07/03/2020   COVID-19 Vaccine (5 - 2024-25 season) 08/25/2023   Health Maintenance Items Addressed:   Additional Screening:  Vision Screening: Recommended annual ophthalmology exams for early detection of glaucoma and other disorders of the eye. Would you like a referral to an eye doctor? No    Dental Screening: Recommended annual dental exams for proper oral hygiene  Community Resource Referral / Chronic Care Management: CRR required this visit?  No   CCM required this visit?  Appt scheduled with PCP   Plan:    I have personally reviewed and noted the following in the patient's chart:   Medical and social history Use of alcohol, tobacco or illicit drugs  Current medications and supplements including opioid prescriptions. Patient is not currently taking opioid prescriptions. Functional ability and status Nutritional status Physical activity Advanced directives List of other physicians Hospitalizations, surgeries, and ER visits in previous 12 months Vitals Screenings to include cognitive, depression, and falls Referrals and appointments  In addition, I have reviewed and discussed with patient certain preventive protocols, quality metrics, and best practice recommendations. A written personalized care plan for preventive services as well as general preventive health recommendations were provided to patient.   Lyle MARLA Right, [place]   06/15/2024   After Visit Summary: (MyChart) Due to this being a telephonic visit, the after visit summary with patients personalized plan was offered to patient via MyChart   Notes: Nothing significant to report at this time.

## 2024-06-16 DIAGNOSIS — R600 Localized edema: Secondary | ICD-10-CM | POA: Insufficient documentation

## 2024-06-16 NOTE — Assessment & Plan Note (Signed)
 Possible additional clot on the left versus additional peripheral swelling from other source.  Will evaluate with labs.

## 2024-06-17 ENCOUNTER — Ambulatory Visit: Payer: Self-pay

## 2024-06-17 DIAGNOSIS — H04123 Dry eye syndrome of bilateral lacrimal glands: Secondary | ICD-10-CM | POA: Diagnosis not present

## 2024-06-17 DIAGNOSIS — H43813 Vitreous degeneration, bilateral: Secondary | ICD-10-CM | POA: Diagnosis not present

## 2024-06-17 DIAGNOSIS — H353114 Nonexudative age-related macular degeneration, right eye, advanced atrophic with subfoveal involvement: Secondary | ICD-10-CM | POA: Diagnosis not present

## 2024-06-17 DIAGNOSIS — H353123 Nonexudative age-related macular degeneration, left eye, advanced atrophic without subfoveal involvement: Secondary | ICD-10-CM | POA: Diagnosis not present

## 2024-06-17 NOTE — Telephone Encounter (Signed)
 Patient is scheduled with [facility] tomorrow

## 2024-06-17 NOTE — Telephone Encounter (Signed)
 Noted

## 2024-06-17 NOTE — Telephone Encounter (Signed)
 Copied from CRM [id number]. Topic: Clinical - Red Word Triage >> Jun 17, 2024  3:17 PM Melissa C wrote: Red Word that prompted transfer to Nurse Triage: patient has blood clots in both legs and has been experiencing pins and needles. Her Granddaughter spoke with Dr. Avelina over the portal and she told her to call in for appointment    FYI Only or Action Required?: FYI only for provider.  Patient was last seen in primary care on 06/08/2024 by Jimmy Charlie FERNS, MD. Called Nurse Triage reporting Leg Swelling. Symptoms began ongoing with current blood clots. Interventions attempted: Prescription medications: has been following treatment plan for blood clots. Symptoms are: unchanged.  Triage Disposition: See Physician Within 24 Hours  Patient/caregiver understands and will follow disposition?: Yes    Reason for Disposition  [1] MODERATE leg swelling (e.g., swelling extends up to knees) AND [2] new-onset or worsening  Answer Assessment - Initial Assessment Questions 1. ONSET: When did the swelling start? (e.g., minutes, hours, days)     Ongoing with blood clot  2. LOCATION: What part of the leg is swollen?  Are both legs swollen or just one leg?     Bilateral lower legs  3. SEVERITY: How bad is the swelling? (e.g., localized; mild, moderate, severe)   - Localized: Small area of swelling localized to one leg.   - MILD pedal edema: Swelling limited to foot and ankle, pitting edema < 1/4 inch (6 mm) deep, rest and elevation eliminate most or all swelling.   - MODERATE edema: Swelling of lower leg to knee, pitting edema > 1/4 inch (6 mm) deep, rest and elevation only partially reduce swelling.   - SEVERE edema: Swelling extends above knee, facial or hand swelling present.      Moderate  4. REDNESS: Does the swelling look red or infected?     No 5. PAIN: Is the swelling painful to touch? If Yes, ask: How painful is it?   (Scale 1-10; mild, moderate or severe)     Mild to moderate   6. FEVER: Do you have a fever? If Yes, ask: What is it, how was it measured, and when did it start?      No 7. CAUSE: What do you think is causing the leg swelling?     Being treated for blood clots  8. MEDICAL HISTORY: Do you have a history of blood clots (e.g., DVT), cancer, heart failure, kidney disease, or liver failure?     Yes, being treated for one now 9. RECURRENT SYMPTOM: Have you had leg swelling before? If Yes, ask: When was the last time? What happened that time?     No 10. OTHER SYMPTOMS: Do you have any other symptoms? (e.g., chest pain, difficulty breathing)       Pins and needles to both legs  Protocols used: Leg Swelling and Edema-A-AH

## 2024-06-18 ENCOUNTER — Ambulatory Visit: Admitting: General Practice

## 2024-06-18 ENCOUNTER — Ambulatory Visit (HOSPITAL_COMMUNITY)
Admission: RE | Admit: 2024-06-18 | Discharge: 2024-06-18 | Disposition: A | Discharge status: Home / Self Care | Source: Ambulatory Visit | Attending: Vascular Surgery | Admitting: Vascular Surgery

## 2024-06-18 ENCOUNTER — Ambulatory Visit: Payer: Self-pay | Admitting: General Practice

## 2024-06-18 ENCOUNTER — Other Ambulatory Visit: Payer: Self-pay | Admitting: General Practice

## 2024-06-18 ENCOUNTER — Encounter: Payer: Self-pay | Admitting: General Practice

## 2024-06-18 VITALS — BP 138/62 | HR 64 | Temp 97.8°F | Ht 63.5 in | Wt 171.0 lb

## 2024-06-18 DIAGNOSIS — M79661 Pain in right lower leg: Secondary | ICD-10-CM | POA: Diagnosis not present

## 2024-06-18 DIAGNOSIS — M7989 Other specified soft tissue disorders: Secondary | ICD-10-CM

## 2024-06-18 DIAGNOSIS — R600 Localized edema: Secondary | ICD-10-CM

## 2024-06-18 DIAGNOSIS — I82452 Acute embolism and thrombosis of left peroneal vein: Secondary | ICD-10-CM

## 2024-06-18 DIAGNOSIS — M79604 Pain in right leg: Secondary | ICD-10-CM | POA: Diagnosis not present

## 2024-06-18 LAB — CBC WITH DIFFERENTIAL/PLATELET
Basophils Absolute: 0 10*3/uL (ref 0.0–0.1)
Basophils Relative: 0.6 % (ref 0.0–3.0)
Eosinophils Absolute: 0.2 10*3/uL (ref 0.0–0.7)
Eosinophils Relative: 3 % (ref 0.0–5.0)
HCT: 35.6 % — ABNORMAL LOW (ref 36.0–46.0)
Hemoglobin: 11.9 g/dL — ABNORMAL LOW (ref 12.0–15.0)
Lymphocytes Relative: 21.8 % (ref 12.0–46.0)
Lymphs Abs: 1.1 10*3/uL (ref 0.7–4.0)
MCHC: 33.5 g/dL (ref 30.0–36.0)
MCV: 99 fl (ref 78.0–100.0)
Monocytes Absolute: 0.5 10*3/uL (ref 0.1–1.0)
Monocytes Relative: 10.4 % (ref 3.0–12.0)
Neutro Abs: 3.3 10*3/uL (ref 1.4–7.7)
Neutrophils Relative %: 64.2 % (ref 43.0–77.0)
Platelets: 202 10*3/uL (ref 150.0–400.0)
RBC: 3.6 Mil/uL — ABNORMAL LOW (ref 3.87–5.11)
RDW: 14.2 % (ref 11.5–15.5)
WBC: 5.1 10*3/uL (ref 4.0–10.5)

## 2024-06-18 LAB — BASIC METABOLIC PANEL WITH GFR
BUN: 18 mg/dL (ref 6–23)
CO2: 30 meq/L (ref 19–32)
Calcium: 9 mg/dL (ref 8.4–10.5)
Chloride: 105 meq/L (ref 96–112)
Creatinine, Ser: 0.92 mg/dL (ref 0.40–1.20)
GFR: 56.67 mL/min — ABNORMAL LOW (ref >60.00)
Glucose, Bld: 127 mg/dL — ABNORMAL HIGH (ref 70–99)
Potassium: 4.1 meq/L (ref 3.5–5.1)
Sodium: 140 meq/L (ref 135–145)

## 2024-06-18 LAB — BRAIN NATRIURETIC PEPTIDE: Pro B Natriuretic peptide (BNP): 133 pg/mL — ABNORMAL HIGH (ref 0.0–100.0)

## 2024-06-18 MED ORDER — CEPHALEXIN 500 MG PO CAPS: 1000.0000 mg | ORAL_CAPSULE | Freq: Two times a day (BID) | ORAL | 0 refills | Status: AC

## 2024-06-18 NOTE — Telephone Encounter (Signed)
 Thank you for seeing her

## 2024-06-18 NOTE — Progress Notes (Signed)
 Established Patient Office Visit  Subjective   Patient ID: Kristina Carlson, female    DOB: 30-Jun-1936  Age: 88 y.o. MRN: [id number]  Chief Complaint  Patient presents with   Edema    In both legs but right is worse. Currently being treated for DVT; patient states legs are very painful and holding them up hurts worse.     HPI  Kristina Carlson is a 88 year old female, patient of Dr. Avelina, with past medical history of HTN, CAD, DVT, GERD, IBS, hypothyroidism, osteopenia, HLD presents today for an acute visit.   Her great grand daughter, Talbert, is also present today.   Edema: symptom onset sin May after her fall. Bilateral, right worse than left. Currently being treated for DVT in the left calf.  Painful and holding them up hurts worse. They feel tight like someone has put tape around her legs.  Pain is keeping her up at night. She has been trying to keep her legs elevated. She has stockings at home however she is not able to put them. She was evaluated by Dr. Letvak and was given lasix. She took it for five days with no improvement. She had a BNP competed on 05/26/24 which was within normal range.   She is able to walk without pain; but getting up is harder due to the swelling.  She reports that last year she developed a Staph infection after a dog bite on her right leg.   She denies any shortness of breath, chest pain or difficulty breathing.    Patient Active Problem List   Diagnosis Date Noted   Pain and swelling of right lower leg 06/18/2024   Peripheral edema 06/16/2024   Hematuria 06/08/2024   Acute deep vein thrombosis (DVT) of left peroneal vein (HCC) 05/26/2024   Leg pain, anterior, right 03/25/2024   Allergic dermatitis 05/03/2023   Fall 02/14/2023   Irritable bowel syndrome 01/11/2023   Precordial pain 01/10/2023   Left hip pain 10/05/2022   Hemorrhoids 07/12/2022   Female bladder prolapse 01/04/2022   Muscle spasm of left lower extremity 01/04/2022   Chronic  constipation 01/04/2022   Chronic low back pain 01/25/2021   Tinnitus of right ear 01/25/2021   COVID-19 07/28/2019   B12 deficiency 09/22/2018   GERD (gastroesophageal reflux disease) 06/20/2016   Osteopenia 06/20/2016   Coronary artery disease involving native coronary artery with angina pectoris (HCC) 06/30/2014   Essential hypertension 09/01/2012   Vitamin D  deficiency, unspecified 08/24/2010   Hyperlipidemia LDL goal <70 03/08/2010   Familial multiple lipoprotein-type hyperlipidemia 03/08/2010   Hypothyroidism 02/20/2007   Past Medical History:  Diagnosis Date   Actinic keratosis    Coronary artery disease involving native coronary artery with angina pectoris (HCC) 06/2014   CARDIAC CATH 06/2014: Mild LAD 50%, OM 1 30%, 50% ostial RCA:;; Coronary CTA February 2024 shows severe ostial RCA disease (FFR ct +0.76).  Mild proximal LAD and LCx stenosis.   Dysplastic nevus 01/24/2023   Right chest. Severe atypia, deep margin involved. Excised 03/06/23   Hyperlipidemia    Hypertension    Hypothyroid    Insomnia    Macular degeneration    -- Dr. Rudell Rue (optometrist).  Taking Ocuvite.   Past Surgical History:  Procedure Laterality Date   Cardiac CTA  01/25/2023   Coronary Calcium  Score 384.  Severe (70 to 99%) ostial RCA=> FFR ct 0.76 (significant).  Mild proximal LAD and LCx stenosis (25 to 49%).   LEFT HEART CATHETERIZATION WITH  CORONARY ANGIOGRAM N/A 06/29/2014   Procedure: LEFT HEART CATHETERIZATION WITH CORONARY ANGIOGRAM;  Surgeon: Victory LELON Claudene DOUGLAS, MD;  Location: [facility] CATH LAB;  Service: Cardiovascular;: Mild LAD 50%, OM 1 30%, 50% ostial RCA:   Allergies  Allergen Reactions   Lisinopril  Cough         06/15/2024    3:51 PM 06/08/2024   10:58 AM 05/26/2024   11:43 AM  Depression screen PHQ 2/9  Decreased Interest 0 0 0  Down, Depressed, Hopeless 0 0 0  PHQ - 2 Score 0 0 0  Altered sleeping 0    Tired, decreased energy 0    Change in appetite 0    Feeling bad or  failure about yourself  0    Trouble concentrating 0    Moving slowly or fidgety/restless 0    Suicidal thoughts 0    PHQ-9 Score 0    Difficult doing work/chores Not difficult at all         02/14/2023    3:54 PM  GAD 7 : Generalized Anxiety Score  Nervous, Anxious, on Edge 0  Control/stop worrying 0  Worry too much - different things 0  Trouble relaxing 0  Restless 0  Easily annoyed or irritable 0  Afraid - awful might happen 0  Total GAD 7 Score 0  Anxiety Difficulty Not difficult at all      Review of Systems  Constitutional:  Negative for chills and fever.  Respiratory:  Negative for shortness of breath.   Cardiovascular:  Positive for leg swelling. Negative for chest pain.  Gastrointestinal:  Negative for abdominal pain, constipation, diarrhea, heartburn, nausea and vomiting.  Genitourinary:  Negative for dysuria, frequency and urgency.  Neurological:  Negative for dizziness and headaches.  Endo/Heme/Allergies:  Negative for polydipsia.  Psychiatric/Behavioral:  Negative for depression and suicidal ideas. The patient is not nervous/anxious.       Objective:     BP 138/62   Pulse 64   Temp 97.8 F (36.6 C) (Oral)   Ht 5' 3.5 (1.613 m)   Wt 171 lb (77.6 kg)   SpO2 96%   BMI 29.82 kg/m  BP Readings from Last 3 Encounters:  06/18/24 138/62  06/15/24 120/68  06/08/24 120/68   Wt Readings from Last 3 Encounters:  06/18/24 171 lb (77.6 kg)  06/15/24 170 lb (77.1 kg)  06/08/24 171 lb (77.6 kg)      Physical Exam Vitals and nursing note reviewed.  Constitutional:      Appearance: Normal appearance.   Cardiovascular:     Rate and Rhythm: Normal rate and regular rhythm.     Pulses:          Dorsalis pedis pulses are 1+ on the right side and 1+ on the left side.       Posterior tibial pulses are 1+ on the right side and 1+ on the left side.     Heart sounds: Normal heart sounds.  Pulmonary:     Effort: Pulmonary effort is normal.     Breath sounds:  Normal breath sounds.   Musculoskeletal:        General: Swelling and tenderness present.     Right lower leg: 2+ Edema present.     Left lower leg: 1+ Edema present.       Legs:   Neurological:     Mental Status: She is alert and oriented to person, place, and time.   Psychiatric:        Mood  and Affect: Mood normal.        Behavior: Behavior normal.        Thought Content: Thought content normal.        Judgment: Judgment normal.      No results found for any visits on 06/18/24.     The ASCVD Risk score (Arnett DK, et al., 2019) failed to calculate for the following reasons:   The 2019 ASCVD risk score is only valid for ages 29 to 78    Assessment & Plan:  Acute deep vein thrombosis (DVT) of left peroneal vein (HCC) Assessment & Plan: Improving.   Continue Xarelto .    Pain and swelling of right lower leg -     Cephalexin; Take 2 capsules (1,000 mg total) by mouth 2 (two) times daily for 5 days.  Dispense: 20 capsule; Refill: 0 -     CBC with Differential/Platelet -     Basic metabolic panel with GFR -     Brain natriuretic peptide -     US  Venous Img Lower Unilateral Right (DVT); Future  Leg pain, anterior, right Assessment & Plan: Unclear etiology.  Pain at rest and worse laying flat.  2+ edema with firmness on exam.   Differentials include cellulitis, DVT.   Discussed at length with patient and her great grand daughter.   STAT Venous US  of right leg.  CBC, BMP and BNP pending.  Start Keflex 1000 mg by mouth twice daily for five days. Recommend compression stockings and elevated leg above heart to help with swelling.  F/u in one week.     Return in about 6 days (around 06/24/2024) for right leg swelling and DVT.SABRA Carrol Aurora, NP

## 2024-06-18 NOTE — Patient Instructions (Addendum)
 I have ordered an ultrasound of your leg. Someone will call you to schedule that.   Stop by the lab prior to leaving today. I will notify you of your results once received.   Start Keflex 500 mg 2 tablets twice a day for five days.   Follow up with Dr. Avelina next Tuesday.   It was a pleasure to see you today!

## 2024-06-18 NOTE — Assessment & Plan Note (Signed)
 Unclear etiology.  Pain at rest and worse laying flat.  2+ edema with firmness on exam.   Differentials include cellulitis, DVT.   Discussed at length with patient and her great grand daughter.   STAT Venous US  of right leg.  CBC, BMP and BNP pending.  Start Keflex 1000 mg by mouth twice daily for five days. Recommend compression stockings and elevated leg above heart to help with swelling.  F/u in one week.

## 2024-06-18 NOTE — Assessment & Plan Note (Signed)
 Improving.   Continue Xarelto .

## 2024-06-22 ENCOUNTER — Ambulatory Visit: Payer: Self-pay | Admitting: General Practice

## 2024-06-24 ENCOUNTER — Encounter: Payer: Self-pay | Admitting: Family Medicine

## 2024-06-24 ENCOUNTER — Ambulatory Visit (INDEPENDENT_AMBULATORY_CARE_PROVIDER_SITE_OTHER): Admitting: Family Medicine

## 2024-06-24 VITALS — BP 142/82 | HR 63 | Temp 97.9°F | Ht 63.0 in | Wt 169.6 lb

## 2024-06-24 DIAGNOSIS — R600 Localized edema: Secondary | ICD-10-CM

## 2024-06-24 DIAGNOSIS — E039 Hypothyroidism, unspecified: Secondary | ICD-10-CM | POA: Diagnosis not present

## 2024-06-24 DIAGNOSIS — I82452 Acute embolism and thrombosis of left peroneal vein: Secondary | ICD-10-CM | POA: Diagnosis not present

## 2024-06-24 LAB — BRAIN NATRIURETIC PEPTIDE: Pro B Natriuretic peptide (BNP): 94 pg/mL (ref 0.0–100.0)

## 2024-06-24 LAB — T3, FREE: T3, Free: 2.8 pg/mL (ref 2.3–4.2)

## 2024-06-24 LAB — TSH: TSH: 0.84 u[IU]/mL (ref 0.35–5.50)

## 2024-06-24 LAB — T4, FREE: Free T4: 0.93 ng/dL (ref 0.60–1.60)

## 2024-06-24 MED ORDER — TRAMADOL HCL 50 MG PO TABS: 50.0000 mg | ORAL_TABLET | Freq: Every evening | ORAL | 0 refills | Status: DC | PRN

## 2024-06-24 MED ORDER — FUROSEMIDE 20 MG PO TABS: 20.0000 mg | ORAL_TABLET | Freq: Every day | ORAL | 0 refills | Status: DC | PRN

## 2024-06-24 NOTE — Assessment & Plan Note (Addendum)
 Recent increase in levothyroxine  to 75 mcg daily. She is due for 4-week check of thyroid  function.  Inadequately controlled thyroid  function could be contributing to peripheral swelling.

## 2024-06-24 NOTE — Progress Notes (Signed)
 Patient ID: Kristina Carlson, female    DOB: 13-Aug-1936, 88 y.o.   MRN: [id number]  This visit was conducted in person.  BP (!) 142/82   Pulse 63   Temp 97.9 F (36.6 C) (Oral)   Ht 5' 3 (1.6 m)   Wt 169 lb 9.6 oz (76.9 kg)   SpO2 99%   BMI 30.04 kg/m    CC:  Chief Complaint  Patient presents with   Follow-up    Leg swelling and DVT     Subjective:   HPI: Kristina Carlson is a 88 y.o. female presenting on 06/24/2024 for Follow-up (Leg swelling and DVT ) Great granddaughter present with her in office today  Diagnosed with left peroneal vein DVT via ultrasound June 3,2025 Likely provoked given minimal movement lately since shoulder fracture. High extension risk HASBLED 3 No symptoms of PE.  Started on 12 weeks of anticoagulation. Xarelto ... Currently status post 4 weeks of medication. At that time she was also noting bilateral peripheral swelling.  Thyroid  function found to be low so she was increased to a full tablet of 75 mcg levothyroxine  daily.  She is due for 4-week reevaluation today.    BNP normal. Recommended leg elevation.  Was unable to wear compression hose given hard to get on.  6/16 treated for [facility] UTI with cephalexin  250 mg TID x 3 days Given a trial of Lasix which did not help after 5 days. She continued to have pain in both lower legs and significant swelling.  She followed up with  WENDI Aurora NP on June 18, 2024 Was noted that she had redness and firmness in right leg so was treated for possible cellulitis with Keflex  1000 mg twice daily x 5 days Ultrasound of right leg negative for DVT  Minimally elevated BNP. Renal function normal. She was restarted on Lasix 20 mg daily  Today she reports continue pain .  Cannot wear compression hose.  Pain with elevating feet.   No falls, had some hematuria with UTI.SABRA resolved now. NO dysuria, no further blood.  No other abnormal bleeding.  Wt Readings from Last 3 Encounters:  06/24/24 169 lb 9.6 oz (76.9 kg)   06/18/24 171 lb (77.6 kg)  06/15/24 170 lb (77.1 kg)   BP Readings from Last 3 Encounters:  06/24/24 (!) 142/82  06/18/24 138/62  06/15/24 120/68     She has been on amlodipine  2.5 since  around April.  Has upcoming appt with Dr. Anner    Tramadol helps with pain at night.  Relevant past medical, surgical, family and social history reviewed and updated as indicated. Interim medical history since our last visit reviewed. Allergies and medications reviewed and updated. Outpatient Medications Prior to Visit  Medication Sig Dispense Refill   amLODipine  (NORVASC ) 2.5 MG tablet Take 1 tablet (2.5 mg total) by mouth daily. 90 tablet 3   Cholecalciferol  (VITAMIN D3) 1000 units CAPS Take 1 Capful by mouth daily.     [employer] VITAMIN B12 1000 MCG tablet TAKE 1 TABLET BY MOUTH EVERY DAY 200 tablet 2   ezetimibe  (ZETIA ) 10 MG tablet Take 1 tablet (10 mg total) by mouth daily. 90 tablet 3   isosorbide  mononitrate (IMDUR ) 30 MG 24 hr tablet Take 1 tablet (30 mg total) by mouth at bedtime. May take extra tablet for a couple of days if she should have chest pain that stops with nitroglycerin  90 tablet 0   levothyroxine  (SYNTHROID ) 75 MCG tablet Take 75 mcg by  mouth daily before breakfast.     losartan  (COZAAR ) 100 MG tablet Take 1 tablet (100 mg total) by mouth daily. 90 tablet 3   metoprolol  succinate (TOPROL -XL) 25 MG 24 hr tablet Take 25 mg by mouth daily.     Multiple Vitamins-Minerals (OCUVITE PRESERVISION) TABS Take 1 tablet by mouth 2 (two) times a day.     nitroGLYCERIN  (NITROSTAT ) 0.4 MG SL tablet Place 1 tablet (0.4 mg total) under the tongue every 5 (five) minutes as needed for chest pain. 25 tablet 3   ondansetron  (ZOFRAN ) 4 MG tablet Take 1 tablet (4 mg total) by mouth every 8 (eight) hours as needed. 60 tablet 0   pantoprazole  (PROTONIX ) 40 MG tablet Take 1 tablet (40 mg total) by mouth daily. 90 tablet 3   RIVAROXABAN  (XARELTO ) VTE STARTER PACK (15 & 20 MG) Follow package directions:  Take one 15mg  tablet by mouth twice a day. On day 22, switch to one 20mg  tablet once a day. Take with food. 51 each 0   rosuvastatin  (CRESTOR ) 40 MG tablet Take 1 tablet (40 mg total) by mouth daily. 90 tablet 3   tobramycin (TOBREX) 0.3 % ophthalmic solution SMARTSIG:In Eye(s)     furosemide (LASIX) 20 MG tablet Take 1 tablet (20 mg total) by mouth daily as needed. 30 tablet 0   traMADol (ULTRAM) 50 MG tablet Take 50 mg by mouth every 4 (four) hours as needed.     FIBER PO Take by mouth daily. Takes gummies     Omega-3 Fatty Acids (FISH OIL OMEGA-3) 1000 MG CAPS Take 1 Capful by mouth daily.     triamcinolone  cream (KENALOG ) 0.5 % Apply 1 Application topically 2 (two) times daily. 30 g 0   No facility-administered medications prior to visit.     Per HPI unless specifically indicated in ROS section below Review of Systems  Constitutional:  Negative for fatigue and fever.  HENT:  Negative for congestion.   Eyes:  Negative for pain.  Respiratory:  Negative for cough and shortness of breath.   Cardiovascular:  Positive for leg swelling. Negative for chest pain and palpitations.  Gastrointestinal:  Negative for abdominal pain.  Genitourinary:  Negative for dysuria and vaginal bleeding.  Musculoskeletal:  Negative for back pain.  Neurological:  Negative for syncope, light-headedness and headaches.  Psychiatric/Behavioral:  Negative for dysphoric mood.    Objective:  BP (!) 142/82   Pulse 63   Temp 97.9 F (36.6 C) (Oral)   Ht 5' 3 (1.6 m)   Wt 169 lb 9.6 oz (76.9 kg)   SpO2 99%   BMI 30.04 kg/m   Wt Readings from Last 3 Encounters:  06/24/24 169 lb 9.6 oz (76.9 kg)  06/18/24 171 lb (77.6 kg)  06/15/24 170 lb (77.1 kg)      Physical Exam Constitutional:      General: She is not in acute distress.    Appearance: Normal appearance. She is well-developed. She is not ill-appearing or toxic-appearing.  HENT:     Head: Normocephalic.     Right Ear: Hearing, tympanic membrane, ear  canal and external ear normal. Tympanic membrane is not erythematous, retracted or bulging.     Left Ear: Hearing, tympanic membrane, ear canal and external ear normal. Tympanic membrane is not erythematous, retracted or bulging.     Nose: No mucosal edema or rhinorrhea.     Right Sinus: No maxillary sinus tenderness or frontal sinus tenderness.     Left Sinus: No maxillary sinus  tenderness or frontal sinus tenderness.     Mouth/Throat:     Pharynx: Uvula midline.  Eyes:     General: Lids are normal. Lids are everted, no foreign bodies appreciated.     Conjunctiva/sclera: Conjunctivae normal.     Pupils: Pupils are equal, round, and reactive to light.  Neck:     Thyroid : No thyroid  mass or thyromegaly.     Vascular: No carotid bruit.     Trachea: Trachea normal.  Cardiovascular:     Rate and Rhythm: Normal rate and regular rhythm.     Pulses:          Dorsalis pedis pulses are 1+ on the right side and 1+ on the left side.       Posterior tibial pulses are 1+ on the right side and 1+ on the left side.     Heart sounds: Normal heart sounds, S1 normal and S2 normal. No murmur heard.    No friction rub. No gallop.     Comments: Pain in bilateral calves Pulmonary:     Effort: Pulmonary effort is normal. No tachypnea or respiratory distress.     Breath sounds: Normal breath sounds. No decreased breath sounds, wheezing, rhonchi or rales.  Abdominal:     General: Bowel sounds are normal.     Palpations: Abdomen is soft.     Tenderness: There is no abdominal tenderness.  Musculoskeletal:     Cervical back: Normal range of motion and neck supple.     Right lower leg: 2+ Edema present.     Left lower leg: 2+ Edema present.  Skin:    General: Skin is warm and dry.     Findings: Rash present.     Comments: Varicose veins bilateral thighs Erythema bilateral calves likely due to peripheral swelling  Neurological:     Mental Status: She is alert.  Psychiatric:        Mood and Affect: Mood  is not anxious or depressed.        Speech: Speech normal.        Behavior: Behavior normal. Behavior is cooperative.        Thought Content: Thought content normal.        Judgment: Judgment normal.       Results for orders placed or performed in visit on 06/18/24  CBC with Differential   Collection Time: 06/18/24 11:29 AM  Result Value Ref Range   WBC 5.1 4.0 - 10.5 K/uL   RBC 3.60 (L) 3.87 - 5.11 Mil/uL   Hemoglobin 11.9 (L) 12.0 - 15.0 g/dL   HCT 64.3 (L) 63.9 - 53.9 %   MCV 99.0 78.0 - 100.0 fl   MCHC 33.5 30.0 - 36.0 g/dL   RDW 85.7 88.4 - 84.4 %   Platelets 202.0 150.0 - 400.0 K/uL   Neutrophils Relative % 64.2 43.0 - 77.0 %   Lymphocytes Relative 21.8 12.0 - 46.0 %   Monocytes Relative 10.4 3.0 - 12.0 %   Eosinophils Relative 3.0 0.0 - 5.0 %   Basophils Relative 0.6 0.0 - 3.0 %   Neutro Abs 3.3 1.4 - 7.7 K/uL   Lymphs Abs 1.1 0.7 - 4.0 K/uL   Monocytes Absolute 0.5 0.1 - 1.0 K/uL   Eosinophils Absolute 0.2 0.0 - 0.7 K/uL   Basophils Absolute 0.0 0.0 - 0.1 K/uL  Basic metabolic panel with GFR   Collection Time: 06/18/24 11:29 AM  Result Value Ref Range   Sodium 140 135 - 145  mEq/L   Potassium 4.1 3.5 - 5.1 mEq/L   Chloride 105 96 - 112 mEq/L   CO2 30 19 - 32 mEq/L   Glucose, Bld 127 (H) 70 - 99 mg/dL   BUN 18 6 - 23 mg/dL   Creatinine, Ser 9.07 0.40 - 1.20 mg/dL   GFR 43.32 (L) >39.99 mL/min   Calcium  9.0 8.4 - 10.5 mg/dL  Brain natriuretic peptide   Collection Time: 06/18/24 11:29 AM  Result Value Ref Range   Pro B Natriuretic peptide (BNP) 133.0 (H) 0.0 - 100.0 pg/mL    Assessment and Plan  Acute deep vein thrombosis (DVT) of left peroneal vein (HCC) Assessment & Plan: Isolated provoked left peroneal vein DVT.  She is now on week 4 of 12 of Xarelto . She did have some hematuria associated with the E. coli infection but this has resolved with antibiotics.  No falls.  She is tolerating anticoagulant well.   Hypothyroidism, unspecified type Assessment &  Plan: Recent increase in levothyroxine  to 75 mcg daily. She is due for 4-week check of thyroid  function.  Inadequately controlled thyroid  function could be contributing to peripheral swelling.  Orders: -     T3, free -     T4, free -     TSH  Peripheral edema Assessment & Plan: Acute, likely multifactorial in part due to decreased physical activity with recent fall and DVT.  Inadequately controlled hypothyroidism contributing although hopefully this is resolved at this point. She is also an amlodipine  low-dose which is a new medication for her which could be contributing to peripheral swelling.  I will have her hold this if she is able. There is no evidence of DVT in the right leg which is the more tender.  Very mildly elevated BNP but given persistence of peripheral edema we will evaluate with an echocardiogram and have her follow-up with cardiology Dr. Anner. I will have her try a trial of increased dose of Lasix to 40 mg daily to see if this helps her diurese more but I am hesitant to do this for a prolonged period of time unless it is very effective as I am worried about hypotension and dehydration.  She does have some evidence of venous insufficiency and varicose veins.  She has not been compliant with elevating her leg or wearing compression hose and I again encouraged her to do this.  Given pain in legs is keeping her up at night I have refilled her tramadol she can use this at bedtime to help with sleep  Orders: -     ECHOCARDIOGRAM COMPLETE; Future -     Brain natriuretic peptide  Other orders -     traMADol HCl; Take 1 tablet (50 mg total) by mouth at bedtime as needed.  Dispense: 15 tablet; Refill: 0 -     Furosemide; Take 1 tablet (20 mg total) by mouth daily as needed. For the next 3 days increase to 40 mg daily, then return to  one daily.  Dispense: 30 tablet; Refill: 0    No follow-ups on file.   Greig Ring, MD

## 2024-06-24 NOTE — Patient Instructions (Addendum)
 Can use tramadol at night for pain.  Hold amlodipine .  Increase lasix to 40 mg daily x 3 days then leower to 20 mg daily. Call if higher dose very effective.  Elevate legs and try wearing compression hose with zippers.  Hold amlodipine  given possible swelling side effect. Walk as much as able.

## 2024-06-24 NOTE — Assessment & Plan Note (Signed)
 Isolated provoked left peroneal vein DVT.  She is now on week 4 of 12 of Xarelto . She did have some hematuria associated with the E. coli infection but this has resolved with antibiotics.  No falls.  She is tolerating anticoagulant well.

## 2024-06-24 NOTE — Assessment & Plan Note (Addendum)
 Acute, likely multifactorial in part due to decreased physical activity with recent fall and DVT.  Inadequately controlled hypothyroidism contributing although hopefully this is resolved at this point. She is also an amlodipine  low-dose which is a new medication for her which could be contributing to peripheral swelling.  I will have her hold this if she is able. There is no evidence of DVT in the right leg which is the more tender.  Very mildly elevated BNP but given persistence of peripheral edema we will evaluate with an echocardiogram and have her follow-up with cardiology Dr. Anner. I will have her try a trial of increased dose of Lasix to 40 mg daily to see if this helps her diurese more but I am hesitant to do this for a prolonged period of time unless it is very effective as I am worried about hypotension and dehydration.  She does have some evidence of venous insufficiency and varicose veins.  She has not been compliant with elevating her leg or wearing compression hose and I again encouraged her to do this.  Given pain in legs is keeping her up at night I have refilled her tramadol she can use this at bedtime to help with sleep

## 2024-06-25 ENCOUNTER — Ambulatory Visit: Payer: Self-pay | Admitting: Family Medicine

## 2024-06-25 DIAGNOSIS — I5189 Other ill-defined heart diseases: Secondary | ICD-10-CM

## 2024-07-04 ENCOUNTER — Other Ambulatory Visit: Payer: Self-pay | Admitting: Family Medicine

## 2024-07-08 ENCOUNTER — Ambulatory Visit (INDEPENDENT_AMBULATORY_CARE_PROVIDER_SITE_OTHER): Admitting: Family Medicine

## 2024-07-08 ENCOUNTER — Encounter: Payer: Self-pay | Admitting: Family Medicine

## 2024-07-08 VITALS — BP 136/68 | HR 63 | Temp 97.7°F | Ht 63.0 in | Wt 168.6 lb

## 2024-07-08 DIAGNOSIS — R600 Localized edema: Secondary | ICD-10-CM

## 2024-07-08 DIAGNOSIS — M79661 Pain in right lower leg: Secondary | ICD-10-CM

## 2024-07-08 DIAGNOSIS — M7989 Other specified soft tissue disorders: Secondary | ICD-10-CM | POA: Diagnosis not present

## 2024-07-08 DIAGNOSIS — I82452 Acute embolism and thrombosis of left peroneal vein: Secondary | ICD-10-CM | POA: Diagnosis not present

## 2024-07-08 MED ORDER — TRAMADOL HCL 50 MG PO TABS: 100.0000 mg | ORAL_TABLET | Freq: Every evening | ORAL | 0 refills | Status: AC | PRN

## 2024-07-08 NOTE — Assessment & Plan Note (Addendum)
 Acute, improving with elevation compression Lasix  20 mg daily and moving back to regular activity. Will try tramadol  100 mg p.o. nightly as needed for leg pain.

## 2024-07-08 NOTE — Assessment & Plan Note (Signed)
 On week 5 of 12 of anticoagulation with Xarelto 

## 2024-07-08 NOTE — Progress Notes (Signed)
 Patient ID: Kristina Carlson, female    DOB: 1936-06-22, 88 y.o.   MRN: [id number]  This visit was conducted in person.  BP 136/68   Pulse 63   Temp 97.7 F (36.5 C) (Oral)   Ht 5' 3 (1.6 m)   Wt 168 lb 9.6 oz (76.5 kg)   SpO2 99%   BMI 29.87 kg/m    CC:  Chief Complaint  Patient presents with   Follow-up    DVT/Swelling. Patient states that her legs still bothering her. She feels rough    Subjective:   HPI: Kristina Carlson is a 88 y.o. female presenting on 07/08/2024 for Follow-up (DVT/Swelling. Patient states that her legs still bothering her. She feels rough) [place] granddaughter present with her in office today  Diagnosed with left peroneal vein DVT via ultrasound June 3,2025 Likely provoked given minimal movement lately since shoulder fracture. High extension risk HASBLED 3 No symptoms of PE.  Started on 12 weeks of anticoagulation. Xarelto ... Currently status post 5 weeks of medication. At that time she was also noting bilateral peripheral swelling.  Thyroid  function found to be low so she was increased to a full tablet of 75 mcg levothyroxine  daily.  Re-eval normal    BNP normal. Recommended leg elevation.  Was unable to wear compression hose given hard to get on.  6/16 treated for [facility] UTI with cephalexin  250 mg TID x 3 days Given a trial of Lasix  which did not help after 5 days. She continued to have pain in both lower legs and significant swelling.  She followed up with  WENDI Aurora NP on June 18, 2024 Was noted that she had redness and firmness in right leg so was treated for possible cellulitis with Keflex  1000 mg twice daily x 5 days Ultrasound of right leg negative for DVT  Minimally elevated BNP. Renal function normal. She was restarted on Lasix  20 mg daily  Today she reports  improvement in swelling... still present but now able to bend them  Trying to wear compression hose... but uncomfortable.  Trying to elevated feet.   No falls, had some hematuria  with UTI.SABRA resolved now. No dysuria, no further blood.  No other abnormal bleeding.  Wt Readings from Last 3 Encounters:  07/08/24 168 lb 9.6 oz (76.5 kg)  06/24/24 169 lb 9.6 oz (76.9 kg)  06/18/24 171 lb (77.6 kg)   BP Readings from Last 3 Encounters:  07/08/24 136/68  06/24/24 (!) 142/82  06/18/24 138/62    At last OV 06/24/2024.. referred for ECHO Given lasix  40 mg daily for short period... caused increase UOP.  She has been on amlodipine  2.5mg .. but has stopped that in last 2 weeks.  Has upcoming appt with Dr. Anner   Tramadol  does  not help  with pain at night... no sedation.SABRA still trouble sleeping.  Relevant past medical, surgical, family and social history reviewed and updated as indicated. Interim medical history since our last visit reviewed. Allergies and medications reviewed and updated. Outpatient Medications Prior to Visit  Medication Sig Dispense Refill   Cholecalciferol  (VITAMIN D3) 1000 units CAPS Take 1 Capful by mouth daily.     [facility] VITAMIN B12 1000 MCG tablet TAKE 1 TABLET BY MOUTH EVERY DAY 200 tablet 2   ezetimibe  (ZETIA ) 10 MG tablet Take 1 tablet (10 mg total) by mouth daily. 90 tablet 3   furosemide  (LASIX ) 20 MG tablet Take 1 tablet (20 mg total) by mouth daily as needed. For the  next 3 days increase to 40 mg daily, then return to  one daily. 30 tablet 0   isosorbide  mononitrate (IMDUR ) 30 MG 24 hr tablet Take 1 tablet (30 mg total) by mouth at bedtime. May take extra tablet for a couple of days if she should have chest pain that stops with nitroglycerin  90 tablet 0   levothyroxine  (SYNTHROID ) 75 MCG tablet Take 75 mcg by mouth daily before breakfast.     losartan  (COZAAR ) 100 MG tablet Take 1 tablet (100 mg total) by mouth daily. 90 tablet 3   metoprolol  succinate (TOPROL -XL) 25 MG 24 hr tablet Take 25 mg by mouth daily.     Multiple Vitamins-Minerals (OCUVITE PRESERVISION) TABS Take 1 tablet by mouth 2 (two) times a day.     nitroGLYCERIN  (NITROSTAT ) 0.4  MG SL tablet Place 1 tablet (0.4 mg total) under the tongue every 5 (five) minutes as needed for chest pain. 25 tablet 3   ondansetron  (ZOFRAN ) 4 MG tablet Take 1 tablet (4 mg total) by mouth every 8 (eight) hours as needed. 60 tablet 0   pantoprazole  (PROTONIX ) 40 MG tablet Take 1 tablet (40 mg total) by mouth daily. 90 tablet 3   RIVAROXABAN  (XARELTO ) VTE STARTER PACK (15 & 20 MG) Follow package directions: Take one 15mg  tablet by mouth twice a day. On day 22, switch to one 20mg  tablet once a day. Take with food. 51 each 0   rosuvastatin  (CRESTOR ) 40 MG tablet TAKE 1 TABLET BY MOUTH EVERY DAY 90 tablet 0   tobramycin (TOBREX) 0.3 % ophthalmic solution SMARTSIG:In Eye(s)     amLODipine  (NORVASC ) 2.5 MG tablet Take 1 tablet (2.5 mg total) by mouth daily. 90 tablet 3   traMADol  (ULTRAM ) 50 MG tablet Take 1 tablet (50 mg total) by mouth at bedtime as needed. 15 tablet 0   No facility-administered medications prior to visit.     Per HPI unless specifically indicated in ROS section below Review of Systems  Constitutional:  Negative for fatigue and fever.  HENT:  Negative for congestion.   Eyes:  Negative for pain.  Respiratory:  Negative for cough and shortness of breath.   Cardiovascular:  Positive for leg swelling. Negative for chest pain and palpitations.  Gastrointestinal:  Negative for abdominal pain.  Genitourinary:  Negative for dysuria and vaginal bleeding.  Musculoskeletal:  Negative for back pain.  Neurological:  Negative for syncope, light-headedness and headaches.  Psychiatric/Behavioral:  Negative for dysphoric mood.    Objective:  BP 136/68   Pulse 63   Temp 97.7 F (36.5 C) (Oral)   Ht 5' 3 (1.6 m)   Wt 168 lb 9.6 oz (76.5 kg)   SpO2 99%   BMI 29.87 kg/m   Wt Readings from Last 3 Encounters:  07/08/24 168 lb 9.6 oz (76.5 kg)  06/24/24 169 lb 9.6 oz (76.9 kg)  06/18/24 171 lb (77.6 kg)      Physical Exam Constitutional:      General: She is not in acute  distress.    Appearance: Normal appearance. She is well-developed. She is not ill-appearing or toxic-appearing.  HENT:     Head: Normocephalic.     Right Ear: Hearing, tympanic membrane, ear canal and external ear normal. Tympanic membrane is not erythematous, retracted or bulging.     Left Ear: Hearing, tympanic membrane, ear canal and external ear normal. Tympanic membrane is not erythematous, retracted or bulging.     Nose: No mucosal edema or rhinorrhea.  Right Sinus: No maxillary sinus tenderness or frontal sinus tenderness.     Left Sinus: No maxillary sinus tenderness or frontal sinus tenderness.     Mouth/Throat:     Pharynx: Uvula midline.  Eyes:     General: Lids are normal. Lids are everted, no foreign bodies appreciated.     Conjunctiva/sclera: Conjunctivae normal.     Pupils: Pupils are equal, round, and reactive to light.  Neck:     Thyroid : No thyroid  mass or thyromegaly.     Vascular: No carotid bruit.     Trachea: Trachea normal.  Cardiovascular:     Rate and Rhythm: Normal rate and regular rhythm.     Pulses:          Dorsalis pedis pulses are 1+ on the right side and 1+ on the left side.       Posterior tibial pulses are 1+ on the right side and 1+ on the left side.     Heart sounds: Normal heart sounds, S1 normal and S2 normal. No murmur heard.    No friction rub. No gallop.     Comments: Pain in bilateral calves Pulmonary:     Effort: Pulmonary effort is normal. No tachypnea or respiratory distress.     Breath sounds: Normal breath sounds. No decreased breath sounds, wheezing, rhonchi or rales.  Abdominal:     General: Bowel sounds are normal.     Palpations: Abdomen is soft.     Tenderness: There is no abdominal tenderness.  Musculoskeletal:     Cervical back: Normal range of motion and neck supple.     Right lower leg: 1+ Edema present.     Left lower leg: 1+ Edema present.  Skin:    General: Skin is warm and dry.     Findings: No rash.     Comments:  Varicose veins bilateral thighs No erythema in lower legs  Neurological:     Mental Status: She is alert.  Psychiatric:        Mood and Affect: Mood is not anxious or depressed.        Speech: Speech normal.        Behavior: Behavior normal. Behavior is cooperative.        Thought Content: Thought content normal.        Judgment: Judgment normal.       Results for orders placed or performed in visit on 06/24/24  T3, free   Collection Time: 06/24/24 12:15 PM  Result Value Ref Range   T3, Free 2.8 2.3 - 4.2 pg/mL  T4, free   Collection Time: 06/24/24 12:15 PM  Result Value Ref Range   Free T4 0.93 0.60 - 1.60 ng/dL  TSH   Collection Time: 06/24/24 12:15 PM  Result Value Ref Range   TSH 0.84 0.35 - 5.50 uIU/mL  Brain natriuretic peptide   Collection Time: 06/24/24 12:15 PM  Result Value Ref Range   Pro B Natriuretic peptide (BNP) 94.0 0.0 - 100.0 pg/mL    Assessment and Plan  Acute deep vein thrombosis (DVT) of left peroneal vein (HCC) Assessment & Plan: On week 5 of 12 of anticoagulation with Xarelto    Pain and swelling of right lower leg Assessment & Plan: Acute, improving with elevation compression Lasix  20 mg daily and moving back to regular activity. Will try tramadol  100 mg p.o. nightly as needed for leg pain.   Peripheral edema Assessment & Plan: Acute, multifactorial Improving with elevation compression hose and return to  regular exercise.  Also have held amlodipine  and this may have also contributed to improvement.  We are still awaiting scheduling of the echocardiogram to evaluate her heart.  She will verify that she does have a follow-up appoint with Dr. Anner  Cardiologycoming up in the next few months.   Other orders -     traMADol  HCl; Take 2 tablets (100 mg total) by mouth at bedtime as needed.  Dispense: 30 tablet; Refill: 0     Return if symptoms worsen or fail to improve.   Greig Ring, MD

## 2024-07-08 NOTE — Assessment & Plan Note (Signed)
 Acute, multifactorial Improving with elevation compression hose and return to regular exercise.  Also have held amlodipine  and this may have also contributed to improvement.  We are still awaiting scheduling of the echocardiogram to evaluate her heart.  She will verify that she does have a follow-up appoint with Dr. Anner  Cardiologycoming up in the next few months.

## 2024-07-09 ENCOUNTER — Encounter: Payer: Self-pay | Admitting: *Deleted

## 2024-07-10 DIAGNOSIS — S42292A Other displaced fracture of upper end of left humerus, initial encounter for closed fracture: Secondary | ICD-10-CM | POA: Diagnosis not present

## 2024-07-22 ENCOUNTER — Other Ambulatory Visit: Payer: Self-pay | Admitting: Cardiology

## 2024-07-23 DIAGNOSIS — M25512 Pain in left shoulder: Secondary | ICD-10-CM | POA: Diagnosis not present

## 2024-07-23 DIAGNOSIS — M25612 Stiffness of left shoulder, not elsewhere classified: Secondary | ICD-10-CM | POA: Diagnosis not present

## 2024-07-24 ENCOUNTER — Other Ambulatory Visit: Payer: Self-pay | Admitting: Medical

## 2024-07-24 DIAGNOSIS — I10 Essential (primary) hypertension: Secondary | ICD-10-CM

## 2024-07-26 ENCOUNTER — Other Ambulatory Visit: Payer: Self-pay | Admitting: Family Medicine

## 2024-07-27 ENCOUNTER — Other Ambulatory Visit: Payer: Self-pay | Admitting: Family Medicine

## 2024-07-27 ENCOUNTER — Encounter: Payer: Self-pay | Admitting: Family Medicine

## 2024-07-27 DIAGNOSIS — M25512 Pain in left shoulder: Secondary | ICD-10-CM | POA: Diagnosis not present

## 2024-07-27 DIAGNOSIS — M25612 Stiffness of left shoulder, not elsewhere classified: Secondary | ICD-10-CM | POA: Diagnosis not present

## 2024-07-27 NOTE — Telephone Encounter (Addendum)
 Discussed this with Dr. Watt. He had me send in Xarelto  20 mg to take one daily #30 with 2 refills.  FYI to Dr. Avelina.

## 2024-07-27 NOTE — Telephone Encounter (Signed)
 See refill request.

## 2024-07-27 NOTE — Telephone Encounter (Signed)
 Pharmacy sent refill request for Xarelto  starter pack.  I think patient should be on 20 mg daily at this point.  Please advise.

## 2024-07-28 MED ORDER — RIVAROXABAN 20 MG PO TABS: 20.0000 mg | ORAL_TABLET | Freq: Every day | ORAL | 2 refills | Status: DC

## 2024-07-28 NOTE — Addendum Note (Signed)
 Addended by: WENDELL ARLAND RAMAN on: 07/28/2024 10:43 AM   Modules accepted: Orders

## 2024-07-29 DIAGNOSIS — M25612 Stiffness of left shoulder, not elsewhere classified: Secondary | ICD-10-CM | POA: Diagnosis not present

## 2024-07-29 DIAGNOSIS — M25512 Pain in left shoulder: Secondary | ICD-10-CM | POA: Diagnosis not present

## 2024-07-30 ENCOUNTER — Ambulatory Visit (INDEPENDENT_AMBULATORY_CARE_PROVIDER_SITE_OTHER): Payer: Medicare Other | Admitting: Family Medicine

## 2024-07-30 VITALS — BP 132/64 | HR 64 | Temp 97.6°F | Ht 63.0 in | Wt 166.1 lb

## 2024-07-30 DIAGNOSIS — Z0000 Encounter for general adult medical examination without abnormal findings: Secondary | ICD-10-CM

## 2024-07-30 DIAGNOSIS — E039 Hypothyroidism, unspecified: Secondary | ICD-10-CM

## 2024-07-30 DIAGNOSIS — I82452 Acute embolism and thrombosis of left peroneal vein: Secondary | ICD-10-CM

## 2024-07-30 DIAGNOSIS — I10 Essential (primary) hypertension: Secondary | ICD-10-CM

## 2024-07-30 DIAGNOSIS — E785 Hyperlipidemia, unspecified: Secondary | ICD-10-CM | POA: Diagnosis not present

## 2024-07-30 DIAGNOSIS — R310 Gross hematuria: Secondary | ICD-10-CM | POA: Diagnosis not present

## 2024-07-30 DIAGNOSIS — I25119 Atherosclerotic heart disease of native coronary artery with unspecified angina pectoris: Secondary | ICD-10-CM | POA: Diagnosis not present

## 2024-07-30 DIAGNOSIS — E538 Deficiency of other specified B group vitamins: Secondary | ICD-10-CM

## 2024-07-30 DIAGNOSIS — E559 Vitamin D deficiency, unspecified: Secondary | ICD-10-CM

## 2024-07-30 LAB — POC URINALSYSI DIPSTICK (AUTOMATED)
Bilirubin, UA: NEGATIVE
Glucose, UA: NEGATIVE
Ketones, UA: NEGATIVE
Nitrite, UA: NEGATIVE
Protein, UA: NEGATIVE
Spec Grav, UA: 1.015 (ref 1.010–1.025)
Urobilinogen, UA: 0.2 U/dL
pH, UA: 6 (ref 5.0–8.0)

## 2024-07-30 LAB — CBC WITH DIFFERENTIAL/PLATELET
Basophils Absolute: 0 K/uL (ref 0.0–0.1)
Basophils Relative: 0.5 % (ref 0.0–3.0)
Eosinophils Absolute: 0.1 K/uL (ref 0.0–0.7)
Eosinophils Relative: 2.4 % (ref 0.0–5.0)
HCT: 37.5 % (ref 36.0–46.0)
Hemoglobin: 12.5 g/dL (ref 12.0–15.0)
Lymphocytes Relative: 25.3 % (ref 12.0–46.0)
Lymphs Abs: 1 K/uL (ref 0.7–4.0)
MCHC: 33.2 g/dL (ref 30.0–36.0)
MCV: 96.5 fl (ref 78.0–100.0)
Monocytes Absolute: 0.5 K/uL (ref 0.1–1.0)
Monocytes Relative: 11.3 % (ref 3.0–12.0)
Neutro Abs: 2.5 K/uL (ref 1.4–7.7)
Neutrophils Relative %: 60.5 % (ref 43.0–77.0)
Platelets: 206 K/uL (ref 150.0–400.0)
RBC: 3.89 Mil/uL (ref 3.87–5.11)
RDW: 13.4 % (ref 11.5–15.5)
WBC: 4.1 K/uL (ref 4.0–10.5)

## 2024-07-30 LAB — VITAMIN D 25 HYDROXY (VIT D DEFICIENCY, FRACTURES): VITD: 47.16 ng/mL (ref 30.00–100.00)

## 2024-07-30 LAB — VITAMIN B12: Vitamin B-12: 1479 pg/mL — ABNORMAL HIGH (ref 211–911)

## 2024-07-30 MED ORDER — LEVOTHYROXINE SODIUM 75 MCG PO TABS: 75.0000 ug | ORAL_TABLET | Freq: Every day | ORAL | 3 refills | Status: AC

## 2024-07-30 MED ORDER — ONDANSETRON HCL 4 MG PO TABS: 4.0000 mg | ORAL_TABLET | Freq: Three times a day (TID) | ORAL | 0 refills | Status: AC | PRN

## 2024-07-30 NOTE — Assessment & Plan Note (Signed)
 Due for re-eval on  rosuvastatin  40 mg daily

## 2024-07-30 NOTE — Assessment & Plan Note (Signed)
 On week 8 out of 12 of Xarelto  anticoagulation.    Likely provoked given minimal movement lately since shoulder fracture. High extension risk HASBLED 3 No symptoms of PE.   Will treat with 12 weeks of anticoagulation.

## 2024-07-30 NOTE — Assessment & Plan Note (Signed)
 Due for re-eval.

## 2024-07-30 NOTE — Assessment & Plan Note (Signed)
Stable, chronic.  Continue current medication. ? ? ?Levothyroxine 75 mcg daily ?

## 2024-07-30 NOTE — Assessment & Plan Note (Addendum)
 Stable, chronic.  Continue current medication.   Followed by Dr. Anner Cardiology.  - Continue losartan  100 mg, Toprol  25 mg, along with Imdur  30 mg daily.  Stopped amlodipine  given worsening swelling peripherally.

## 2024-07-30 NOTE — Progress Notes (Signed)
 Patient ID: Kristina Carlson, female    DOB: July 21, 1936, 88 y.o.   MRN: [id number]  This visit was conducted in person.  BP 132/64   Pulse 64   Temp 97.6 F (36.4 C) (Oral)   Ht 5' 3 (1.6 m)   Wt 166 lb 2 oz (75.4 kg)   SpO2 99%   BMI 29.43 kg/m    CC:  Chief Complaint  Patient presents with   Annual Exam    Subjective:   HPI: Kristina Carlson is a 88 y.o. female presenting on 07/30/2024 for Annual Exam  The patient presents for annual medicare wellness, complete physical and review of chronic health problems. He/She also has the following acute concerns today: none  The patient saw a LPN or RN for medicare wellness visit. 06/15/2024  Prevention and wellness was reviewed in detail. Note reviewed and important notes copied below.  Recent diagnosis of DVT on week 8 out of 12 of Xarelto  anticoagulation proved after fall and shoulder injury. Associated peripheral swelling improved Wt Readings from Last 3 Encounters:  07/30/24 166 lb 2 oz (75.4 kg)  07/08/24 168 lb 9.6 oz (76.5 kg)  06/24/24 169 lb 9.6 oz (76.9 kg)    She has seen some blood on toilet tissue and pad since being on xarelto  1 day a week. No burning with urination.     Hypothyroid : well controlled on levo 75 mcg daily Lab Results  Component Value Date   TSH 0.84 06/24/2024     CAD, medical managment followed  Dr Anner  Intermittent chest pain better with imdur . Peripheral swelling better off amlodipine .  Hypertension:   Well controlled on losartan  100 mg daily, Toprol  25 mg daily and Imdur  30 mg daily.  Has follow up next month with cardiology. BP Readings from Last 3 Encounters:  07/30/24 132/64  07/08/24 136/68  06/24/24 (!) 142/82    Elevated Cholesterol:  on rosuvastatin   40 mg daily. Due for re-eval Lab Results  Component Value Date   CHOL 95 10/21/2023   HDL 37.70 (L) 10/21/2023   LDLCALC 42 10/21/2023   LDLDIRECT 84.0 06/06/2021   TRIG 78.0 10/21/2023   CHOLHDL 3 10/21/2023  Using  medications without problems: Muscle aches:  Diet compliance: heart healthy Exercise:minimal Other complaints:     Relevant past medical, surgical, family and social history reviewed and updated as indicated. Interim medical history since our last visit reviewed. Allergies and medications reviewed and updated. Outpatient Medications Prior to Visit  Medication Sig Dispense Refill   Cholecalciferol  (VITAMIN D3) 1000 units CAPS Take 1 Capful by mouth daily.     [facility] VITAMIN B12 1000 MCG tablet TAKE 1 TABLET BY MOUTH EVERY DAY 200 tablet 2   ezetimibe  (ZETIA ) 10 MG tablet TAKE 1 TABLET BY MOUTH EVERY DAY 90 tablet 2   furosemide  (LASIX ) 20 MG tablet Take 1 tablet (20 mg total) by mouth daily. 30 tablet 2   isosorbide  mononitrate (IMDUR ) 30 MG 24 hr tablet TAKE 1 TABLET (30 MG TOTAL) BY MOUTH AT BEDTIME. MAY TAKE EXTRA TABLET FOR A COUPLE OF DAYS IF SHE SHOULD HAVE CHEST PAIN THAT STOPS WITH NITROGLYCERIN  135 tablet 1   losartan  (COZAAR ) 100 MG tablet TAKE 1 TABLET BY MOUTH EVERY DAY 90 tablet 2   metoprolol  succinate (TOPROL -XL) 25 MG 24 hr tablet Take 25 mg by mouth daily.     Multiple Vitamins-Minerals (OCUVITE PRESERVISION) TABS Take 1 tablet by mouth 2 (two) times a day.  nitroGLYCERIN  (NITROSTAT ) 0.4 MG SL tablet Place 1 tablet (0.4 mg total) under the tongue every 5 (five) minutes as needed for chest pain. 25 tablet 3   pantoprazole  (PROTONIX ) 40 MG tablet Take 1 tablet (40 mg total) by mouth daily. 90 tablet 3   rivaroxaban  (XARELTO ) 20 MG TABS tablet Take 1 tablet (20 mg total) by mouth daily with supper. 30 tablet 2   rosuvastatin  (CRESTOR ) 40 MG tablet TAKE 1 TABLET BY MOUTH EVERY DAY 90 tablet 0   tobramycin (TOBREX) 0.3 % ophthalmic solution SMARTSIG:In Eye(s)     traMADol  (ULTRAM ) 50 MG tablet Take 2 tablets (100 mg total) by mouth at bedtime as needed. 30 tablet 0   levothyroxine  (SYNTHROID ) 75 MCG tablet Take 75 mcg by mouth daily before breakfast.     ondansetron  (ZOFRAN ) 4  MG tablet Take 1 tablet (4 mg total) by mouth every 8 (eight) hours as needed. 60 tablet 0   No facility-administered medications prior to visit.     Per HPI unless specifically indicated in ROS section below Review of Systems  Constitutional:  Negative for fatigue and fever.  HENT:  Negative for congestion.   Eyes:  Negative for pain.  Respiratory:  Negative for cough and shortness of breath.   Cardiovascular:  Negative for chest pain, palpitations and leg swelling.  Gastrointestinal:  Negative for abdominal pain.  Genitourinary:  Negative for dysuria and vaginal bleeding.  Musculoskeletal:  Negative for back pain.  Neurological:  Negative for syncope, light-headedness and headaches.  Psychiatric/Behavioral:  Negative for dysphoric mood.    Objective:  BP 132/64   Pulse 64   Temp 97.6 F (36.4 C) (Oral)   Ht 5' 3 (1.6 m)   Wt 166 lb 2 oz (75.4 kg)   SpO2 99%   BMI 29.43 kg/m   Wt Readings from Last 3 Encounters:  07/30/24 166 lb 2 oz (75.4 kg)  07/08/24 168 lb 9.6 oz (76.5 kg)  06/24/24 169 lb 9.6 oz (76.9 kg)      Physical Exam Vitals and nursing note reviewed.  Constitutional:      General: She is not in acute distress.    Appearance: Normal appearance. She is well-developed. She is not ill-appearing or toxic-appearing.  HENT:     Head: Normocephalic.     Right Ear: Hearing, tympanic membrane, ear canal and external ear normal.     Left Ear: Hearing, tympanic membrane, ear canal and external ear normal.     Nose: Nose normal.  Eyes:     General: Lids are normal. Lids are everted, no foreign bodies appreciated.     Conjunctiva/sclera: Conjunctivae normal.     Pupils: Pupils are equal, round, and reactive to light.  Neck:     Thyroid : No thyroid  mass or thyromegaly.     Vascular: No carotid bruit.     Trachea: Trachea normal.  Cardiovascular:     Rate and Rhythm: Normal rate and regular rhythm.     Heart sounds: Normal heart sounds, S1 normal and S2 normal. No  murmur heard.    No gallop.  Pulmonary:     Effort: Pulmonary effort is normal. No respiratory distress.     Breath sounds: Normal breath sounds. No wheezing, rhonchi or rales.  Abdominal:     General: Bowel sounds are normal. There is no distension or abdominal bruit.     Palpations: Abdomen is soft. There is no fluid wave or mass.     Tenderness: There is no abdominal  tenderness. There is no guarding or rebound.     Hernia: No hernia is present.  Musculoskeletal:     Cervical back: Normal range of motion and neck supple.  Lymphadenopathy:     Cervical: No cervical adenopathy.  Skin:    General: Skin is warm and dry.     Findings: No rash.  Neurological:     Mental Status: She is alert.     Cranial Nerves: No cranial nerve deficit.     Sensory: No sensory deficit.  Psychiatric:        Mood and Affect: Mood is not anxious or depressed.        Speech: Speech normal.        Behavior: Behavior normal. Behavior is cooperative.        Judgment: Judgment normal.       Results for orders placed or performed in visit on 07/30/24  POCT Urinalysis Dipstick (Automated)   Collection Time: 07/30/24 12:30 PM  Result Value Ref Range   Color, UA yellow    Clarity, UA clear    Glucose, UA Negative Negative   Bilirubin, UA Negative    Ketones, UA Negative    Spec Grav, UA 1.015 1.010 - 1.025   Blood, UA 3+    pH, UA 6.0 5.0 - 8.0   Protein, UA Negative Negative   Urobilinogen, UA 0.2 0.2 or 1.0 E.U./dL   Nitrite, UA Negative    Leukocytes, UA Large (3+) (A) Negative     COVID 19 screen:  No recent travel or known exposure to COVID19 The patient denies respiratory symptoms of COVID 19 at this time. The importance of social distancing was discussed today.   Assessment and Plan The patient's preventative maintenance and recommended screening tests for an annual wellness exam were reviewed in full today. Brought up to date unless services declined.  Counselled on the importance of  diet, exercise, and its role in overall health and mortality. The patient's FH and SH was reviewed, including their home life, tobacco status, and drug and alcohol status.   Vaccines: Up-to-date with pneumonia vaccine, Shingrix vaccine, COVID-vaccine x 4 and flu vaccine.  Next tetanus due in 2029 Pap/DVE: Not indicated due to age Mammo: Not indicated due to age and she has no family history Bone Density: Last done in 2016 showed osteopenia, she is not interested in doing this year Colon: Not indicated due to age  smoking Status: None ETOH/ drug use: None  Hep C: Completed   Problem List Items Addressed This Visit     Acute deep vein thrombosis (DVT) of left peroneal vein (HCC)   On week 8 out of 12 of Xarelto  anticoagulation.    Likely provoked given minimal movement lately since shoulder fracture. High extension risk HASBLED 3 No symptoms of PE.   Will treat with 12 weeks of anticoagulation.         B12 deficiency (Chronic)    Due for re-eval.      Relevant Orders   Vitamin B12   Coronary artery disease involving native coronary artery with angina pectoris (HCC) (Chronic)   Followed by Cardiology Dr. Anner.  Intermittent CP has improved on Imdur .       Essential hypertension (Chronic)   Stable, chronic.  Continue current medication.   Followed by Dr. Anner Cardiology.  - Continue losartan  100 mg, Toprol  25 mg, along with Imdur  30 mg daily.  Stopped amlodipine  given worsening swelling peripherally.       Hematuria  Acute, recurrent in setting of Eliquis anticoagulation Very minimal.  Will evaluate CBC and iron panel. Blood seen in urine along with white blood cell count.  Will evaluate for urinary tract infection.  If persistent will refer to urology for further evaluation.      Relevant Orders   CBC with Differential/Platelet   IBC + Ferritin   POCT Urinalysis Dipstick (Automated) (Completed)   Urine Culture   Hyperlipidemia LDL goal <70 (Chronic)    Due for re-eval on  rosuvastatin  40 mg daily       Relevant Orders   Lipid panel   Comprehensive metabolic panel with GFR   Hypothyroidism (Chronic)    Stable, chronic.  Continue current medication.  Levothyroxine  75 mcg daily      Relevant Medications   levothyroxine  (SYNTHROID ) 75 MCG tablet   Vitamin D  deficiency, unspecified (Chronic)   Due for re-eval.      Relevant Orders   VITAMIN D  25 Hydroxy (Vit-D Deficiency, Fractures)   Other Visit Diagnoses       Routine general medical examination at a health care facility    -  Primary            Greig Ring, MD

## 2024-07-30 NOTE — Assessment & Plan Note (Signed)
 Acute, recurrent in setting of Eliquis anticoagulation Very minimal.  Will evaluate CBC and iron panel. Blood seen in urine along with white blood cell count.  Will evaluate for urinary tract infection.  If persistent will refer to urology for further evaluation.

## 2024-07-30 NOTE — Assessment & Plan Note (Addendum)
 Followed by Cardiology Dr. Anner.  Intermittent CP has improved on Imdur .

## 2024-07-31 ENCOUNTER — Other Ambulatory Visit: Payer: Self-pay | Admitting: Family Medicine

## 2024-07-31 ENCOUNTER — Encounter: Payer: Self-pay | Admitting: Family Medicine

## 2024-07-31 ENCOUNTER — Ambulatory Visit: Payer: Self-pay | Admitting: Family Medicine

## 2024-07-31 LAB — IBC + FERRITIN
Ferritin: 34.2 ng/mL (ref 10.0–291.0)
Iron: 51 ug/dL (ref 42–145)
Saturation Ratios: 14.9 % — ABNORMAL LOW (ref 20.0–50.0)
TIBC: 341.6 ug/dL (ref 250.0–450.0)
Transferrin: 244 mg/dL (ref 212.0–360.0)

## 2024-07-31 LAB — LIPID PANEL
Cholesterol: 70 mg/dL (ref 0–200)
HDL: 29.6 mg/dL — ABNORMAL LOW (ref >39.00)
LDL Cholesterol: 28 mg/dL (ref 0–99)
NonHDL: 40.58
Total CHOL/HDL Ratio: 2
Triglycerides: 65 mg/dL (ref 0.0–149.0)
VLDL: 13 mg/dL (ref 0.0–40.0)

## 2024-07-31 LAB — URINE CULTURE
MICRO NUMBER:: 16800927
SPECIMEN QUALITY:: ADEQUATE

## 2024-07-31 LAB — COMPREHENSIVE METABOLIC PANEL WITH GFR
ALT: 71 U/L — ABNORMAL HIGH (ref 0–35)
AST: 66 U/L — ABNORMAL HIGH (ref 0–37)
Albumin: 3.6 g/dL (ref 3.5–5.2)
Alkaline Phosphatase: 93 U/L (ref 39–117)
BUN: 12 mg/dL (ref 6–23)
CO2: 29 meq/L (ref 19–32)
Calcium: 9 mg/dL (ref 8.4–10.5)
Chloride: 105 meq/L (ref 96–112)
Creatinine, Ser: 0.78 mg/dL (ref 0.40–1.20)
GFR: 69.03 mL/min (ref >60.00)
Glucose, Bld: 75 mg/dL (ref 70–99)
Potassium: 4.6 meq/L (ref 3.5–5.1)
Sodium: 144 meq/L (ref 135–145)
Total Bilirubin: 0.4 mg/dL (ref 0.2–1.2)
Total Protein: 5.8 g/dL — ABNORMAL LOW (ref 6.0–8.3)

## 2024-07-31 MED ORDER — CEPHALEXIN 500 MG PO CAPS: 500.0000 mg | ORAL_CAPSULE | Freq: Three times a day (TID) | ORAL | 0 refills | Status: DC

## 2024-08-03 DIAGNOSIS — M25512 Pain in left shoulder: Secondary | ICD-10-CM | POA: Diagnosis not present

## 2024-08-03 DIAGNOSIS — M25612 Stiffness of left shoulder, not elsewhere classified: Secondary | ICD-10-CM | POA: Diagnosis not present

## 2024-08-04 ENCOUNTER — Other Ambulatory Visit: Payer: Self-pay | Admitting: Family Medicine

## 2024-08-04 DIAGNOSIS — R7989 Other specified abnormal findings of blood chemistry: Secondary | ICD-10-CM

## 2024-08-06 DIAGNOSIS — M25612 Stiffness of left shoulder, not elsewhere classified: Secondary | ICD-10-CM | POA: Diagnosis not present

## 2024-08-06 DIAGNOSIS — M25512 Pain in left shoulder: Secondary | ICD-10-CM | POA: Diagnosis not present

## 2024-08-10 DIAGNOSIS — M25512 Pain in left shoulder: Secondary | ICD-10-CM | POA: Diagnosis not present

## 2024-08-10 DIAGNOSIS — M25612 Stiffness of left shoulder, not elsewhere classified: Secondary | ICD-10-CM | POA: Diagnosis not present

## 2024-08-12 DIAGNOSIS — M25512 Pain in left shoulder: Secondary | ICD-10-CM | POA: Diagnosis not present

## 2024-08-12 DIAGNOSIS — M25612 Stiffness of left shoulder, not elsewhere classified: Secondary | ICD-10-CM | POA: Diagnosis not present

## 2024-08-17 DIAGNOSIS — M25612 Stiffness of left shoulder, not elsewhere classified: Secondary | ICD-10-CM | POA: Diagnosis not present

## 2024-08-17 DIAGNOSIS — M25512 Pain in left shoulder: Secondary | ICD-10-CM | POA: Diagnosis not present

## 2024-08-18 DIAGNOSIS — H353114 Nonexudative age-related macular degeneration, right eye, advanced atrophic with subfoveal involvement: Secondary | ICD-10-CM | POA: Diagnosis not present

## 2024-08-18 DIAGNOSIS — H04123 Dry eye syndrome of bilateral lacrimal glands: Secondary | ICD-10-CM | POA: Diagnosis not present

## 2024-08-18 DIAGNOSIS — H43813 Vitreous degeneration, bilateral: Secondary | ICD-10-CM | POA: Diagnosis not present

## 2024-08-18 DIAGNOSIS — H353123 Nonexudative age-related macular degeneration, left eye, advanced atrophic without subfoveal involvement: Secondary | ICD-10-CM | POA: Diagnosis not present

## 2024-08-19 DIAGNOSIS — M25512 Pain in left shoulder: Secondary | ICD-10-CM | POA: Diagnosis not present

## 2024-08-19 DIAGNOSIS — M25612 Stiffness of left shoulder, not elsewhere classified: Secondary | ICD-10-CM | POA: Diagnosis not present

## 2024-08-24 DIAGNOSIS — M25512 Pain in left shoulder: Secondary | ICD-10-CM | POA: Diagnosis not present

## 2024-08-24 DIAGNOSIS — M25612 Stiffness of left shoulder, not elsewhere classified: Secondary | ICD-10-CM | POA: Diagnosis not present

## 2024-08-26 ENCOUNTER — Encounter: Payer: Self-pay | Admitting: Family Medicine

## 2024-08-26 DIAGNOSIS — M25512 Pain in left shoulder: Secondary | ICD-10-CM | POA: Diagnosis not present

## 2024-08-26 DIAGNOSIS — M25612 Stiffness of left shoulder, not elsewhere classified: Secondary | ICD-10-CM | POA: Diagnosis not present

## 2024-08-31 DIAGNOSIS — M25612 Stiffness of left shoulder, not elsewhere classified: Secondary | ICD-10-CM | POA: Diagnosis not present

## 2024-08-31 DIAGNOSIS — M25512 Pain in left shoulder: Secondary | ICD-10-CM | POA: Diagnosis not present

## 2024-09-01 ENCOUNTER — Other Ambulatory Visit: Payer: Self-pay | Admitting: Family Medicine

## 2024-09-01 DIAGNOSIS — I82452 Acute embolism and thrombosis of left peroneal vein: Secondary | ICD-10-CM

## 2024-09-03 ENCOUNTER — Ambulatory Visit: Admitting: Obstetrics & Gynecology

## 2024-09-03 ENCOUNTER — Ambulatory Visit: Payer: Self-pay | Admitting: Family Medicine

## 2024-09-03 ENCOUNTER — Other Ambulatory Visit (INDEPENDENT_AMBULATORY_CARE_PROVIDER_SITE_OTHER)

## 2024-09-03 ENCOUNTER — Encounter: Payer: Self-pay | Admitting: Obstetrics & Gynecology

## 2024-09-03 ENCOUNTER — Ambulatory Visit
Admission: RE | Admit: 2024-09-03 | Discharge: 2024-09-03 | Disposition: A | Discharge status: Home / Self Care | Source: Ambulatory Visit | Attending: Family Medicine | Admitting: Family Medicine

## 2024-09-03 VITALS — BP 144/75 | HR 73

## 2024-09-03 DIAGNOSIS — N761 Subacute and chronic vaginitis: Secondary | ICD-10-CM | POA: Diagnosis not present

## 2024-09-03 DIAGNOSIS — Z86718 Personal history of other venous thrombosis and embolism: Secondary | ICD-10-CM | POA: Diagnosis not present

## 2024-09-03 DIAGNOSIS — N8110 Cystocele, unspecified: Secondary | ICD-10-CM

## 2024-09-03 DIAGNOSIS — I82452 Acute embolism and thrombosis of left peroneal vein: Secondary | ICD-10-CM

## 2024-09-03 DIAGNOSIS — Z4689 Encounter for fitting and adjustment of other specified devices: Secondary | ICD-10-CM | POA: Diagnosis not present

## 2024-09-03 DIAGNOSIS — R7989 Other specified abnormal findings of blood chemistry: Secondary | ICD-10-CM

## 2024-09-03 DIAGNOSIS — N952 Postmenopausal atrophic vaginitis: Secondary | ICD-10-CM

## 2024-09-03 LAB — HEPATIC FUNCTION PANEL
ALT: 72 U/L — ABNORMAL HIGH (ref 0–35)
AST: 58 U/L — ABNORMAL HIGH (ref 0–37)
Albumin: 3.7 g/dL (ref 3.5–5.2)
Alkaline Phosphatase: 83 U/L (ref 39–117)
Bilirubin, Direct: 0.1 mg/dL (ref 0.0–0.3)
Total Bilirubin: 0.4 mg/dL (ref 0.2–1.2)
Total Protein: 6.3 g/dL (ref 6.0–8.3)

## 2024-09-03 MED ORDER — CLINDAMYCIN HCL 300 MG PO CAPS: 300.0000 mg | ORAL_CAPSULE | Freq: Two times a day (BID) | ORAL | 0 refills | Status: AC

## 2024-09-03 MED ORDER — ESTRADIOL 7.5 MCG/24HR VA RING: 1.0000 | VAGINAL_RING | VAGINAL | 4 refills | Status: AC

## 2024-09-03 MED ORDER — METRONIDAZOLE 500 MG PO TABS: 500.0000 mg | ORAL_TABLET | Freq: Two times a day (BID) | ORAL | 0 refills | Status: DC

## 2024-09-03 NOTE — Progress Notes (Addendum)
 GYNECOLOGY OFFICE VISIT NOTE  History:   Kristina Carlson is a 88 y.o. [phone] female with stage II pelvic organ prolapse who presents for pessary maintenance.  She has been using a size #4 ring with support pessary. Patient reported seeing blood while using the bathroom for the past couple of weeks. She told her PCP, who prescribed antibiotics for presumed UTI.  She does not see the bleeding every day.  She denies any vaginal or vulvar irritation, abdominal pain, pelvic pain or other concerns.    Past Medical History:  Diagnosis Date   Actinic keratosis    Coronary artery disease involving native coronary artery with angina pectoris (HCC) 06/2014   CARDIAC CATH 06/2014: Mild LAD 50%, OM 1 30%, 50% ostial RCA:;; Coronary CTA February 2024 shows severe ostial RCA disease (FFR ct +0.76).  Mild proximal LAD and LCx stenosis.   Dysplastic nevus 01/24/2023   Right chest. Severe atypia, deep margin involved. Excised 03/06/23   Hyperlipidemia    Hypertension    Hypothyroid    Insomnia    Macular degeneration    -- Dr. Rudell Rue (optometrist).  Taking Ocuvite.    Past Surgical History:  Procedure Laterality Date   Cardiac CTA  01/25/2023   Coronary Calcium  Score 384.  Severe (70 to 99%) ostial RCA=> FFR ct 0.76 (significant).  Mild proximal LAD and LCx stenosis (25 to 49%).   LEFT HEART CATHETERIZATION WITH CORONARY ANGIOGRAM N/A 06/29/2014   Procedure: LEFT HEART CATHETERIZATION WITH CORONARY ANGIOGRAM;  Surgeon: Victory LELON Claudene DOUGLAS, MD;  Location: [facility] CATH LAB;  Service: Cardiovascular;: Mild LAD 50%, OM 1 30%, 50% ostial RCA:   The following portions of the patient's history were reviewed and updated as appropriate: allergies, current medications, past family history, past medical history, past social history, past surgical history and problem list.   Review of Systems:  Pertinent items noted in HPI and remainder of comprehensive ROS otherwise negative.  Physical Exam:  BP (!) 144/75    Pulse 73  CONSTITUTIONAL: Well-developed, well-nourished female in no acute distress.  MUSCULOSKELETAL: Normal range of motion. No edema noted. NEUROLOGIC: Alert and oriented to person, place, and time. Normal muscle tone coordination. No cranial nerve deficit noted. PSYCHIATRIC: Normal mood and affect. Normal behavior. Normal judgment and thought content. CARDIOVASCULAR: Normal heart rate noted RESPIRATORY: Effort and breath sounds normal, no problems with respiration noted ABDOMEN: No masses noted. No other overt distention noted.   PELVIC: Normal external female genitalia with moderate atrophy; Urethral meatus normal in appearance, no urethral masses or discharge. Foul smelling dark red blood noted at introitus, this blood was noted in the vagina as well.  The pessary was removed and noted to be covered with old blood, this was cleaned. Speculum exam revealed denuded and inflamed area of the upper vagina on the right and adjacent side of cervix, with blood surface.  About 2 x 4 cm in size.  Significant overall vaginal atrophy noted, but no other lesions in the vagina.  Examination done in presence of RN as the chaperone.     Assessment and Plan:     1. Vaginal atrophy Discussed need for estrogen therapy once area heals, to help with atrophy and to help the vagina withstand the pessary being in place.  - estradiol  (ESTRING ) 7.5 MCG/24HR vaginal ring; Place 1 each vaginally every 3 (three) months. follow package directions  Dispense: 1 each; Refill: 4  2. Subacute vaginitis Metronidazole  prescribed.   - metroNIDAZOLE  (FLAGYL ) 500 MG  tablet; Take 1 tablet (500 mg total) by mouth 2 (two) times daily for 7 days.  Dispense: 14 tablet; Refill: 0  3. Pelvic organ prolapse quantification stage 2 cystocele 4. Pessary maintenance (Primary) Pessary will not be replaced today, will let vaginal area heal.  Patient to return in about 3 weeks for reevaluation. If healed, will replace the size #4 ring with  support pessary but with Estring  (or other vaginal estrogen therapy) The cleaned pessary was given to patient.  Routine preventative health maintenance measures emphasized, follow up with PCP regarding other medical issues.   Return in about 3 weeks (around 09/24/2024) for Follow up vaginal inflammation/pessary replacement.    I spent 25 minutes dedicated to the care of this patient including pre-visit review of records, face to face time with the patient discussing her conditions and treatments and post visit orders.   GLORIS HUGGER, MD, FACOG Obstetrician & Gynecologist, [facility] for [employer], [facility]

## 2024-09-03 NOTE — Progress Notes (Signed)
 Patient called and her insurance said Metronidazole  will be $300.Clindamycin  300 mg po bid x 7 days prescribed instead.   Kristina Hugger, MD

## 2024-09-03 NOTE — Addendum Note (Signed)
 Addended by: HERCHEL GRUMET A on: 09/03/2024 01:23 PM   Modules accepted: Orders

## 2024-09-04 ENCOUNTER — Ambulatory Visit: Payer: Self-pay | Admitting: Family Medicine

## 2024-09-04 ENCOUNTER — Other Ambulatory Visit

## 2024-09-04 LAB — HEPATITIS PANEL, ACUTE
Hep A IgM: NONREACTIVE
Hep B C IgM: NONREACTIVE
Hepatitis B Surface Ag: NONREACTIVE
Hepatitis C Ab: NONREACTIVE

## 2024-09-08 ENCOUNTER — Encounter: Payer: Self-pay | Admitting: Dermatology

## 2024-09-08 ENCOUNTER — Ambulatory Visit (INDEPENDENT_AMBULATORY_CARE_PROVIDER_SITE_OTHER): Admitting: Dermatology

## 2024-09-08 DIAGNOSIS — L578 Other skin changes due to chronic exposure to nonionizing radiation: Secondary | ICD-10-CM

## 2024-09-08 DIAGNOSIS — R21 Rash and other nonspecific skin eruption: Secondary | ICD-10-CM | POA: Diagnosis not present

## 2024-09-08 MED ORDER — LIDOCAINE 5 % EX OINT: 1.0000 | TOPICAL_OINTMENT | CUTANEOUS | 2 refills | Status: AC | PRN

## 2024-09-08 MED ORDER — TRIAMCINOLONE ACETONIDE 0.1 % EX CREA: 1.0000 | TOPICAL_CREAM | Freq: Two times a day (BID) | CUTANEOUS | 3 refills | Status: AC | PRN

## 2024-09-08 NOTE — Progress Notes (Unsigned)
   Follow-Up Visit   Subjective  Kristina Carlson is a 88 y.o. female who presents for the following: Rash on right shoulder and itching. Patient reports she has been itching for about 3 weeks. Patient was prescribed keflex  07/31/24 and starting itching afterwards. Patient unsure if rash/itching related. Patient also recently taken off of Xarelto . Patient has been using cortisone 10 cream.    The following portions of the chart were reviewed this encounter and updated as appropriate: medications, allergies, medical history  Review of Systems:  No other skin or systemic complaints except as noted in HPI or Assessment and Plan.  Objective  Well appearing patient in no apparent distress; mood and affect are within normal limits.  A focused examination was performed of the following areas: Chest, back   Relevant exam findings are noted in the Assessment and Plan.      Assessment & Plan   Rash Exam: ***  Differential diagnosis:  ***  Treatment Plan: Start TMC 0.1% cream, apply BID to aa, for up to 2 weeks. Avoid applying to face, groin, and axilla. Use as directed. Long-term use can cause thinning of the skin.  Topical steroids (such as triamcinolone , fluocinolone, fluocinonide, mometasone, clobetasol, halobetasol, betamethasone, hydrocortisone) can cause thinning and lightening of the skin if they are used for too long in the same area. Your physician has selected the right strength medicine for your problem and area affected on the body. Please use your medication only as directed by your physician to prevent side effects.    If not resolving, may use lidocaine  ointment for up to three times a day to aa.     ACTINIC ELASTOSIS   RASH    Return for PRN.  I, Jacquelynn V. Wilfred, CMA, am acting as scribe for Boneta Sharps, MD .   Documentation: I have reviewed the above documentation for accuracy and completeness, and I agree with the above.  Boneta Sharps,  MD

## 2024-09-08 NOTE — Patient Instructions (Signed)

## 2024-09-09 ENCOUNTER — Ambulatory Visit
Admission: RE | Admit: 2024-09-09 | Discharge: 2024-09-09 | Disposition: A | Discharge status: Home / Self Care | Source: Ambulatory Visit | Attending: Family Medicine | Admitting: Family Medicine

## 2024-09-09 DIAGNOSIS — I083 Combined rheumatic disorders of mitral, aortic and tricuspid valves: Secondary | ICD-10-CM | POA: Insufficient documentation

## 2024-09-09 DIAGNOSIS — R600 Localized edema: Secondary | ICD-10-CM | POA: Diagnosis not present

## 2024-09-09 LAB — ECHOCARDIOGRAM COMPLETE
AR max vel: 3.26 cm2
AV Area VTI: 3.4 cm2
AV Area mean vel: 3.35 cm2
AV Mean grad: 3.5 mmHg
AV Peak grad: 6.1 mmHg
Ao pk vel: 1.24 m/s
Area-P 1/2: 2.82 cm2
MV VTI: 2.69 cm2
S' Lateral: 2.9 cm

## 2024-09-09 NOTE — Progress Notes (Signed)
*  PRELIMINARY RESULTS* Echocardiogram 2D Echocardiogram has been performed.  Kristina Carlson 09/09/2024, 11:31 AM

## 2024-09-10 ENCOUNTER — Ambulatory Visit: Payer: Self-pay

## 2024-09-10 ENCOUNTER — Ambulatory Visit: Admitting: Family Medicine

## 2024-09-10 DIAGNOSIS — M5136 Other intervertebral disc degeneration, lumbar region with discogenic back pain only: Secondary | ICD-10-CM | POA: Diagnosis not present

## 2024-09-10 DIAGNOSIS — M7062 Trochanteric bursitis, left hip: Secondary | ICD-10-CM | POA: Diagnosis not present

## 2024-09-10 NOTE — Telephone Encounter (Signed)
 FYI Only or Action Required?: FYI only for provider.  Patient was last seen in primary care on 07/30/2024 by [name], MD.  Called Nurse Triage reporting Hip Pain.  Symptoms began several days ago.  Interventions attempted: Rest, hydration, or home remedies.  Symptoms are: unchanged.  Triage Disposition: See PCP When Office is Open (Within 3 Days)  Patient/caregiver understands and will follow disposition?: Yes  Copied from CRM [id number]. Topic: Clinical - Red Word Triage >> Sep 10, 2024  9:38 AM Antwanette L wrote: Red Word that prompted transfer to Nurse Triage: Pt is experiencing pain in her left hip. Pt is requesting a steroid/cortisone shot Reason for Disposition  [1] MODERATE pain (e.g., interferes with normal activities, limping) AND [2] present > 3 days  Answer Assessment - Initial Assessment Questions 1. LOCATION and RADIATION: Where is the pain located? Does the pain spread (shoot) anywhere else?     Left hip 2. QUALITY: What does the pain feel like?  (e.g., sharp, dull, aching, burning)     sharp 3. SEVERITY: How bad is the pain? What does it keep you from doing?   (Scale 1-10; or mild, moderate, severe)     3 out of 10-pain becomes severe at night 4. ONSET: When did the pain start? Does it come and go, or is it there all the time?     Started Sunday night 5. WORK OR EXERCISE: Has there been any recent work or exercise that involved this part of the body?      no 6. CAUSE: What do you think is causing the hip pain?      Bursitis in left hip 7. AGGRAVATING FACTORS: What makes the hip pain worse? (e.g., walking, climbing stairs, running)     Worse in the night 8. OTHER SYMPTOMS: Do you have any other symptoms? (e.g., back pain, pain shooting down leg,  fever, rash)     no  Protocols used: Hip Pain-A-AH

## 2024-09-10 NOTE — Telephone Encounter (Signed)
 She was scheduled to see you today at 2:40 pm but cancelled.

## 2024-09-15 DIAGNOSIS — I5189 Other ill-defined heart diseases: Secondary | ICD-10-CM | POA: Insufficient documentation

## 2024-09-17 ENCOUNTER — Telehealth: Payer: Self-pay | Admitting: *Deleted

## 2024-09-17 DIAGNOSIS — R7989 Other specified abnormal findings of blood chemistry: Secondary | ICD-10-CM

## 2024-09-17 NOTE — Telephone Encounter (Signed)
-----   Message from Veva JINNY Ferrari sent at 09/17/2024  2:51 PM EDT ----- Regarding: Lab orders for Tue, 10.7.25 Lab orders for a 3 week lab appt, thanks

## 2024-09-26 ENCOUNTER — Other Ambulatory Visit: Payer: Self-pay | Admitting: Cardiology

## 2024-09-29 ENCOUNTER — Ambulatory Visit: Admitting: Obstetrics & Gynecology

## 2024-09-29 ENCOUNTER — Encounter: Payer: Self-pay | Admitting: Obstetrics & Gynecology

## 2024-09-29 ENCOUNTER — Other Ambulatory Visit

## 2024-09-29 ENCOUNTER — Ambulatory Visit: Payer: Self-pay | Admitting: Family Medicine

## 2024-09-29 VITALS — BP 137/73 | HR 59 | Wt 161.0 lb

## 2024-09-29 DIAGNOSIS — N8110 Cystocele, unspecified: Secondary | ICD-10-CM

## 2024-09-29 DIAGNOSIS — R7989 Other specified abnormal findings of blood chemistry: Secondary | ICD-10-CM

## 2024-09-29 DIAGNOSIS — Z4689 Encounter for fitting and adjustment of other specified devices: Secondary | ICD-10-CM | POA: Diagnosis not present

## 2024-09-29 LAB — HEPATIC FUNCTION PANEL
ALT: 125 U/L — ABNORMAL HIGH (ref 0–35)
AST: 97 U/L — ABNORMAL HIGH (ref 0–37)
Albumin: 3.8 g/dL (ref 3.5–5.2)
Alkaline Phosphatase: 93 U/L (ref 39–117)
Bilirubin, Direct: 0.1 mg/dL (ref 0.0–0.3)
Total Bilirubin: 0.5 mg/dL (ref 0.2–1.2)
Total Protein: 5.9 g/dL — ABNORMAL LOW (ref 6.0–8.3)

## 2024-09-29 NOTE — Progress Notes (Signed)
 GYNECOLOGY OFFICE VISIT NOTE  History:   Kristina Carlson is a 88 y.o. [phone] female with stage II pelvic organ prolapse who presents for pessary maintenance.  Last visit on 09/03/24, pessary was not replaced due to subacute vaginitis.  She was told to come back today for reevaluation and possible pessary replacement, with vaginal estrogen therapy.  Today, patient reports that since last visit and after antibiotic course, she had no further bleeding, discharge or urinary leaking.  She is able to ear regular underwear, has no been able to do that in a long time.  Wants to know if she can keep the pessary out until she feels she has to wear it again.  She did not fill the prescription for the vaginal estrogen therapy.  She denies any pelvic pain or other concerns.    Past Medical History:  Diagnosis Date   Actinic keratosis    Coronary artery disease involving native coronary artery with angina pectoris 06/2014   CARDIAC CATH 06/2014: Mild LAD 50%, OM 1 30%, 50% ostial RCA:;; Coronary CTA February 2024 shows severe ostial RCA disease (FFR ct +0.76).  Mild proximal LAD and LCx stenosis.   Dysplastic nevus 01/24/2023   Right chest. Severe atypia, deep margin involved. Excised 03/06/23   Hyperlipidemia    Hypertension    Hypothyroid    Insomnia    Macular degeneration    -- Dr. Rudell Rue (optometrist).  Taking Ocuvite.    Past Surgical History:  Procedure Laterality Date   Cardiac CTA  01/25/2023   Coronary Calcium  Score 384.  Severe (70 to 99%) ostial RCA=> FFR ct 0.76 (significant).  Mild proximal LAD and LCx stenosis (25 to 49%).   LEFT HEART CATHETERIZATION WITH CORONARY ANGIOGRAM N/A 06/29/2014   Procedure: LEFT HEART CATHETERIZATION WITH CORONARY ANGIOGRAM;  Surgeon: Victory LELON Claudene DOUGLAS, MD;  Location: [facility] CATH LAB;  Service: Cardiovascular;: Mild LAD 50%, OM 1 30%, 50% ostial RCA:   The following portions of the patient's history were reviewed and updated as appropriate: allergies,  current medications, past family history, past medical history, past social history, past surgical history and problem list.   Review of Systems:  Pertinent items noted in HPI and remainder of comprehensive ROS otherwise negative.  Physical Exam:  BP 137/73   Pulse (!) 59   Wt 161 lb (73 kg)   BMI 28.52 kg/m  CONSTITUTIONAL: Well-developed, well-nourished female in no acute distress.  MUSCULOSKELETAL: Normal range of motion. No edema noted. NEUROLOGIC: Alert and oriented to person, place, and time. Normal muscle tone coordination. No cranial nerve deficit noted. PSYCHIATRIC: Normal mood and affect. Normal behavior. Normal judgment and thought content. CARDIOVASCULAR: Normal heart rate noted RESPIRATORY: Effort and breath sounds normal, no problems with respiration noted ABDOMEN: No masses noted. No other overt distention noted.   PELVIC: Deferred by patient `   Assessment and Plan:     1. Pelvic organ prolapse quantification stage 2 cystocele (Primary) 2. Pessary maintenance  Given that patient is asymptomatic, pessary will not be replaced today, and she is satisfied with this. If she becomes symptomatic, she will return for replacement of the size #4 ring with support pessary but with Estring  (or other vaginal estrogen therapy).  The cleaned pessary was given to patient. Routine preventative health maintenance measures emphasized, she will follow up with PCP regarding other medical issues.   Return for follow up as recommended.    I spent 25 minutes dedicated to the care of this patient including  pre-visit review of records, face to face time with the patient discussing her conditions and treatments and post visit orders.   GLORIS HUGGER, MD, FACOG Obstetrician & Gynecologist, [facility] for [employer], [facility]

## 2024-10-01 ENCOUNTER — Ambulatory Visit: Admitting: Cardiology

## 2024-10-01 ENCOUNTER — Other Ambulatory Visit: Payer: Self-pay | Admitting: Family Medicine

## 2024-10-01 ENCOUNTER — Ambulatory Visit: Payer: Self-pay | Admitting: Family Medicine

## 2024-10-01 ENCOUNTER — Ambulatory Visit
Admission: RE | Admit: 2024-10-01 | Discharge: 2024-10-01 | Disposition: A | Discharge status: Home / Self Care | Source: Ambulatory Visit | Attending: Family Medicine | Admitting: Family Medicine

## 2024-10-01 DIAGNOSIS — R7401 Elevation of levels of liver transaminase levels: Secondary | ICD-10-CM | POA: Diagnosis not present

## 2024-10-01 DIAGNOSIS — R7989 Other specified abnormal findings of blood chemistry: Secondary | ICD-10-CM

## 2024-10-02 ENCOUNTER — Other Ambulatory Visit: Payer: Self-pay | Admitting: Family Medicine

## 2024-10-02 DIAGNOSIS — R945 Abnormal results of liver function studies: Secondary | ICD-10-CM

## 2024-10-12 NOTE — Progress Notes (Unsigned)
 Cardiology Clinic Note   Date: 10/14/2024 ID: Kristina Carlson, Kristina Carlson 1936-04-16, MRN [id number]  Primary Cardiologist:  Alm Clay, MD  Chief Complaint   Kristina Carlson is a 88 y.o. female who presents to the clinic today for routine follow up.   Patient Profile   Kristina Carlson is followed by Dr. Clay for the history outlined below.      Past medical history significant for: CAD. LHC 06/29/2014 (chest pain): Widely patent coronary arteries with 40 to 50% ostial RCA and up to 50% mid LAD.  Ostial LCx <30%.  Normal LV function. Coronary CTA with FFR 01/25/2023: Coronary calcium  score 387 (69th percentile).  Severe ostial RCA stenosis > 70%.  Mild proximal LAD and LCx stenosis 25 to 49%.  FFR showed significant ostial RCA stenosis.  Recommend cardiac catheterization. Hypertension. Hyperlipidemia. Lipid panel 07/30/2024: LDL 28, HDL 30, TG 65, total 70. GERD. Hypothyroidism. DVT.  June 2025. She completed course of Xarelto .   In summary, patient underwent hospital admission in July 2015 for chest pain.  LHC showed widely patent coronary arteries with 40 to 50% ostial RCA and up to 50% mid LAD, <30% ostial LCx.  She was seen virtually by Dr. Claudene on 04/21/2019 with complaints of similar chest pain.  She had recently been evaluated in the ED with normal EKG and unremarkable cardiac markers.  It was felt her symptoms were musculoskeletal/nonischemic and she was trialed on ibuprofen 400 mg twice daily x 4 doses.  She was also provided with an Rx for as needed SL NTG.  She was instructed to contact the office with persistent discomfort.    Patient was first seen by Dr. Clay on 01/10/2023 for evaluation of chest pain at the request of Dr. Watt.  Patient described a month-long history of intermittent episodic chest pain starting at the center and radiating to both sides.  Pain usually worse with lying flat or on her left side and better if she lifts the head of the bed or sits up.  It is not  associated with exertion.  Coronary CTA with FFR showed significant ostial RCA stenosis and heart catheterization was recommended.  Upon follow-up she opted for medical management and close follow-up.  She was started on isosorbide  and Toprol .  On follow-up April 2024 she reported less frequent chest discomfort and shortness of breath.  Unfortunately, on follow-up in August 2024 patient reported developing a rash on her chest and face from isosorbide  so it was discontinued.   Patient was seen in follow-up in October 2024 for evaluation of chest pain.  She reported since stopping metoprolol  she developed persistent chest pain and a burning sensation for about 2 weeks. She reported discomfort was constant and located across center of her chest radiating to left arm. Pain is not related to physical activity or specific movements. It was discovered that due to a misunderstanding patient actually stopped metoprolol  and continued isosorbide . She reported rash improved somewhat but did not resolve completely. She contacted the office and was instructed to start amlodipine  but she had not done that at the time of her visit. It was decided to have patient restart metoprolol . She was instructed to contact the office in 1 week and if no improvement to chest discomfort start amlodipine . Patient also mentioned being concerned about gallbladder problems and was encouraged to follow-up with PCP who had started pantoprazole  and famotidine .  Upon follow-up in January 2025 she reported rare episodes of chest pain if she lies on  her right side at night but otherwise pain is resolved.  She did stop Toprol  and amlodipine  in early January secondary to low BP of 99/50.  She continued to complain of rash.  BP at the time of her visit was 142/78.  She was instructed to continue losartan  in the morning and resume Toprol  at half tablet 12.5 mg in the evening.  She was continued on amlodipine  2.5 mg.  Patient was last seen in the office by  Dr. Anner on 04/02/2024 for routine follow up. She was doing well from a cardiac standpoint with infrequent episodes of chest pain. No medication changes were made.       History of Present Illness    Today, patient is accompanied by her granddaughter. She reports she is doing well. Patient denies shortness of breath, dyspnea on exertion, orthopnea or PND. She reports she developed lower extremity edema bilaterally after breaking her arm in May. She initially complained of leg edema and US  was ordered by ortho which demonstrated DVT. She was started on Xarelto . She was then seen by PCP and started on Lasix  for continued edema. It did not improve so she stopped and then upon re-evaluation she restarted it for a few days. She does not think it helped at all. She reports edema is now resolved. She still has some tightness around her left  ankle. She has completed her course of Xarelto , as DVT was provoked by inactivity after breaking arm. PCP also stopped amlodipine .  No chest pain, pressure, or tightness. No palpitations.  PCP stopped Crestor  secondary to liver enzymes trending up. She is scheduled to have labs redrawn in 2 weeks. She does some occasional walking for exercise but does not typically participate in regular exercise.     ROS: All other systems reviewed and are otherwise negative except as noted in History of Present Illness.  EKGs/Labs Reviewed    EKG Interpretation Date/Time:  Wednesday October 14 2024 15:55:13 EDT Ventricular Rate:  60 PR Interval:  150 QRS Duration:  70 QT Interval:  410 QTC Calculation: 410 R Axis:   -9  Text Interpretation: Normal sinus rhythm Low voltage QRS When compared with ECG of 02-Apr-2024 10:01, No significant change was found Confirmed by Loistine Sober [id number]) on 10/14/2024 3:57:19 PM   07/30/2024: BUN 12; Creatinine, Ser 0.78; Potassium 4.6; Sodium 144 09/29/2024: ALT 125; AST 97   07/30/2024: Hemoglobin 12.5; WBC 4.1   06/24/2024: TSH 0.84    06/24/2024: Pro B Natriuretic peptide (BNP) 94.0   Risk Assessment/Calculations      HYPERTENSION CONTROL Vitals:   10/14/24 1546 10/14/24 1549 10/14/24 1642  BP: (!) 178/66 (!) 154/68 (!) 140/70    The patient's blood pressure is elevated above target today.  In order to address the patient's elevated BP: Blood pressure will be monitored at home to determine if medication changes need to be made.           Physical Exam    VS:  BP (!) 140/70 (BP Location: Left Arm, Patient Position: Sitting, Cuff Size: Large)   Pulse 62   Resp 18   Ht 5' 3 (1.6 m)   Wt 159 lb (72.1 kg)   SpO2 95%   BMI 28.17 kg/m  , BMI Body mass index is 28.17 kg/m.  GEN: Well nourished, well developed, in no acute distress. Neck: No JVD or carotid bruits. Cardiac:  RRR.  No murmur. No rubs or gallops.   Respiratory:  Respirations regular and unlabored. Clear to  auscultation without rales, wheezing or rhonchi. GI: Soft, nontender, nondistended. Extremities: Radials/DP/PT 2+ and equal bilaterally. No clubbing or cyanosis. No edema  Skin: Warm and dry, no rash. Neuro: Strength intact.  Assessment & Plan   Coronary Artery Disease LHC July 2015 showed moderate nonobstructive CAD.  Coronary CTA February 2024 showed calcium  score 387 with severe ostial RCA stenosis found to be significant by FFR and LHC was recommended.  Patient opted for medical management.  Patient denies any recent chest pain or shortness of breath.  -Continue aspirin , Toprol , isosorbide , and Zetia .  Provoked DVT/Lower extremity edema Patient reports she developed lower extremity edema after breaking her arm in a fall at a restaurant in May. Ortho ordered US  which demonstrated a DVT of left lower extremity. Right lower extremity with no DVT. Repeat US  in September showed resolution of DVT. Patient completed course of Xarelto . She was tried on Lasix  secondary to persistent edema with no improvement. Edema has now resolved other than a  little bit of tightness around left ankle. No edema on exam today. DP/PT 2+ and equal bilaterally.  - No further testing or need for diuretic clinically indicated.    Hypertension BP today 154/68 on intake and 140/70 on my recheck.  Home BP has not been checked recently. PCP stopped amlodipine  secondary to lower extremity edema that is now resolved. If BP trends up can consider restarting amlodipine , as it was the lowest dose and likely not the cause of lower extremity edema.  -Continue Losartan , Toprol , isosorbide .    Hyperlipidemia LDL 20 August 2024, at goal. PCP stopped Crestor  secondary to liver enzymes trending up. She is scheduled for blood work for recheck in 2 weeks. Would recommend rechecking fasting lipid panel in another month or so.  -Continue Zetia .  Disposition: Return in 6 months or sooner as needed.          Signed, Barnie HERO. Katerra Ingman, DNP, NP-C

## 2024-10-13 DIAGNOSIS — H353114 Nonexudative age-related macular degeneration, right eye, advanced atrophic with subfoveal involvement: Secondary | ICD-10-CM | POA: Diagnosis not present

## 2024-10-13 DIAGNOSIS — H353123 Nonexudative age-related macular degeneration, left eye, advanced atrophic without subfoveal involvement: Secondary | ICD-10-CM | POA: Diagnosis not present

## 2024-10-13 DIAGNOSIS — H43813 Vitreous degeneration, bilateral: Secondary | ICD-10-CM | POA: Diagnosis not present

## 2024-10-13 DIAGNOSIS — H04123 Dry eye syndrome of bilateral lacrimal glands: Secondary | ICD-10-CM | POA: Diagnosis not present

## 2024-10-14 ENCOUNTER — Ambulatory Visit: Attending: Student | Admitting: Student

## 2024-10-14 ENCOUNTER — Encounter: Payer: Self-pay | Admitting: Student

## 2024-10-14 VITALS — BP 140/70 | HR 62 | Resp 18 | Ht 63.0 in | Wt 159.0 lb

## 2024-10-14 DIAGNOSIS — I2510 Atherosclerotic heart disease of native coronary artery without angina pectoris: Secondary | ICD-10-CM | POA: Diagnosis not present

## 2024-10-14 DIAGNOSIS — E785 Hyperlipidemia, unspecified: Secondary | ICD-10-CM

## 2024-10-14 DIAGNOSIS — I824Y2 Acute embolism and thrombosis of unspecified deep veins of left proximal lower extremity: Secondary | ICD-10-CM

## 2024-10-14 DIAGNOSIS — I10 Essential (primary) hypertension: Secondary | ICD-10-CM | POA: Diagnosis not present

## 2024-10-14 DIAGNOSIS — R600 Localized edema: Secondary | ICD-10-CM | POA: Diagnosis not present

## 2024-10-14 NOTE — Patient Instructions (Signed)
 Medication Instructions:   Your physician recommends that you continue on your current medications as directed. Please refer to the Current Medication list given to you today.    *If you need a refill on your cardiac medications before your next appointment, please call your pharmacy*  Lab Work:  None ordered at this time   If you have labs (blood work) drawn today and your tests are completely normal, you will receive your results only by:  MyChart Message (if you have MyChart) OR  A paper copy in the mail If you have any lab test that is abnormal or we need to change your treatment, we will call you to review the results.  Testing/Procedures:  None ordered at this time   Referrals:  None ordered at this time   Follow-Up:  At [facility], you and your health needs are our priority.  As part of our continuing mission to provide you with exceptional heart care, our providers are all part of one team.  This team includes your primary Cardiologist (physician) and Advanced Practice Providers or APPs (Physician Assistants and Nurse Practitioners) who all work together to provide you with the care you need, when you need it.  Your next appointment:   5 - 6 month(s)  Provider:    Alm Clay, MD    We recommend signing up for the patient portal called MyChart.  Sign up information is provided on this After Visit Summary.  MyChart is used to connect with patients for Virtual Visits (Telemedicine).  Patients are able to view lab/test results, encounter notes, upcoming appointments, etc.  Non-urgent messages can be sent to your provider as well.   To learn more about what you can do with MyChart, go to [email or web address].

## 2024-10-21 ENCOUNTER — Other Ambulatory Visit: Payer: Self-pay | Admitting: Cardiology

## 2024-10-21 ENCOUNTER — Other Ambulatory Visit: Payer: Self-pay | Admitting: Family Medicine

## 2024-10-28 ENCOUNTER — Ambulatory Visit: Payer: Self-pay | Admitting: Family Medicine

## 2024-10-28 ENCOUNTER — Other Ambulatory Visit (INDEPENDENT_AMBULATORY_CARE_PROVIDER_SITE_OTHER)

## 2024-10-28 DIAGNOSIS — R945 Abnormal results of liver function studies: Secondary | ICD-10-CM | POA: Diagnosis not present

## 2024-10-28 LAB — HEPATIC FUNCTION PANEL
ALT: 22 U/L (ref 0–35)
AST: 22 U/L (ref 0–37)
Albumin: 3.7 g/dL (ref 3.5–5.2)
Alkaline Phosphatase: 94 U/L (ref 39–117)
Bilirubin, Direct: 0.1 mg/dL (ref 0.0–0.3)
Total Bilirubin: 0.5 mg/dL (ref 0.2–1.2)
Total Protein: 6.4 g/dL (ref 6.0–8.3)

## 2024-10-29 ENCOUNTER — Encounter: Payer: Self-pay | Admitting: Pharmacist

## 2024-10-29 NOTE — Progress Notes (Signed)
 Pharmacy Quality Measure Review  This patient is appearing on a report for being at risk of failing the adherence measure for cholesterol (statin) medications this calendar year.   Medication: rosuvastatin  40 mg Last fill date: 07/06/24 for 90 day supply  Medication currently on hold in the setting of elevated LFTs.  Per chart notes, plan to repeat labs Nov.  Of note statin are deemed beneficial in FLD. Consider resuming pending LFT severity.

## 2025-01-18 ENCOUNTER — Other Ambulatory Visit: Payer: Self-pay | Admitting: Family Medicine

## 2025-01-18 ENCOUNTER — Other Ambulatory Visit: Payer: Self-pay | Admitting: Cardiology

## 2025-03-18 ENCOUNTER — Ambulatory Visit: Admitting: Cardiology
